# Patient Record
Sex: Female | Born: 1975 | Race: Black or African American | Hispanic: No | Marital: Married | Smoking: Never smoker
Health system: Southern US, Community
[De-identification: ages and names within clinical notes are randomized; demographics above are authoritative.]

## PROBLEM LIST (undated history)

## (undated) DIAGNOSIS — I10 Essential (primary) hypertension: Secondary | ICD-10-CM

## (undated) DIAGNOSIS — R011 Cardiac murmur, unspecified: Secondary | ICD-10-CM

## (undated) HISTORY — PX: ABDOMINOPLASTY: SUR9

## (undated) HISTORY — DX: Cardiac murmur, unspecified: R01.1

## (undated) HISTORY — PX: KNEE SURGERY: SHX244

## (undated) HISTORY — PX: APPENDECTOMY: SHX54

## (undated) HISTORY — PX: CHOLECYSTECTOMY: SHX55

---

## 2001-11-05 ENCOUNTER — Inpatient Hospital Stay (HOSPITAL_COMMUNITY): Admission: AD | Admit: 2001-11-05 | Discharge: 2001-11-05 | Payer: Self-pay | Admitting: *Deleted

## 2001-11-06 ENCOUNTER — Inpatient Hospital Stay (HOSPITAL_COMMUNITY): Admission: AD | Admit: 2001-11-06 | Discharge: 2001-11-08 | Payer: Self-pay | Admitting: *Deleted

## 2002-06-26 ENCOUNTER — Other Ambulatory Visit: Admission: RE | Admit: 2002-06-26 | Discharge: 2002-06-26 | Payer: Self-pay | Admitting: *Deleted

## 2002-07-06 ENCOUNTER — Observation Stay (HOSPITAL_COMMUNITY): Admission: AD | Admit: 2002-07-06 | Discharge: 2002-07-08 | Payer: Self-pay | Admitting: *Deleted

## 2002-07-07 ENCOUNTER — Encounter (INDEPENDENT_AMBULATORY_CARE_PROVIDER_SITE_OTHER): Payer: Self-pay

## 2002-09-09 ENCOUNTER — Encounter: Payer: Self-pay | Admitting: *Deleted

## 2002-09-09 ENCOUNTER — Ambulatory Visit (HOSPITAL_COMMUNITY): Admission: RE | Admit: 2002-09-09 | Discharge: 2002-09-09 | Payer: Self-pay | Admitting: *Deleted

## 2003-01-27 ENCOUNTER — Inpatient Hospital Stay (HOSPITAL_COMMUNITY): Admission: AD | Admit: 2003-01-27 | Discharge: 2003-01-27 | Payer: Self-pay | Admitting: *Deleted

## 2003-02-04 ENCOUNTER — Inpatient Hospital Stay (HOSPITAL_COMMUNITY): Admission: AD | Admit: 2003-02-04 | Discharge: 2003-02-07 | Payer: Self-pay | Admitting: *Deleted

## 2003-02-05 ENCOUNTER — Encounter (INDEPENDENT_AMBULATORY_CARE_PROVIDER_SITE_OTHER): Payer: Self-pay

## 2003-06-28 ENCOUNTER — Emergency Department (HOSPITAL_COMMUNITY): Admission: EM | Admit: 2003-06-28 | Discharge: 2003-06-28 | Payer: Self-pay | Admitting: Emergency Medicine

## 2003-06-29 ENCOUNTER — Encounter (INDEPENDENT_AMBULATORY_CARE_PROVIDER_SITE_OTHER): Payer: Self-pay | Admitting: Specialist

## 2003-06-29 ENCOUNTER — Observation Stay (HOSPITAL_COMMUNITY): Admission: EM | Admit: 2003-06-29 | Discharge: 2003-06-30 | Payer: Self-pay | Admitting: Emergency Medicine

## 2003-08-07 ENCOUNTER — Emergency Department (HOSPITAL_COMMUNITY): Admission: EM | Admit: 2003-08-07 | Discharge: 2003-08-07 | Payer: Self-pay

## 2003-08-15 ENCOUNTER — Encounter: Admission: RE | Admit: 2003-08-15 | Discharge: 2003-08-15 | Payer: Self-pay | Admitting: Emergency Medicine

## 2003-09-10 ENCOUNTER — Encounter: Admission: RE | Admit: 2003-09-10 | Discharge: 2003-09-10 | Payer: Self-pay | Admitting: Emergency Medicine

## 2005-11-17 ENCOUNTER — Ambulatory Visit (HOSPITAL_COMMUNITY): Admission: RE | Admit: 2005-11-17 | Discharge: 2005-11-17 | Payer: Self-pay | Admitting: Obstetrics & Gynecology

## 2006-05-06 ENCOUNTER — Emergency Department (HOSPITAL_COMMUNITY): Admission: EM | Admit: 2006-05-06 | Discharge: 2006-05-06 | Payer: Self-pay | Admitting: Family Medicine

## 2006-08-03 ENCOUNTER — Inpatient Hospital Stay (HOSPITAL_COMMUNITY): Admission: AD | Admit: 2006-08-03 | Discharge: 2006-08-03 | Payer: Self-pay | Admitting: Obstetrics & Gynecology

## 2006-08-03 ENCOUNTER — Encounter (INDEPENDENT_AMBULATORY_CARE_PROVIDER_SITE_OTHER): Payer: Self-pay | Admitting: Specialist

## 2007-03-26 ENCOUNTER — Emergency Department (HOSPITAL_COMMUNITY): Admission: EM | Admit: 2007-03-26 | Discharge: 2007-03-26 | Payer: Self-pay | Admitting: Emergency Medicine

## 2007-04-24 ENCOUNTER — Other Ambulatory Visit: Admission: RE | Admit: 2007-04-24 | Discharge: 2007-04-24 | Payer: Self-pay | Admitting: Obstetrics & Gynecology

## 2007-09-25 ENCOUNTER — Emergency Department (HOSPITAL_COMMUNITY): Admission: EM | Admit: 2007-09-25 | Discharge: 2007-09-25 | Payer: Self-pay | Admitting: Family Medicine

## 2008-09-15 ENCOUNTER — Inpatient Hospital Stay (HOSPITAL_COMMUNITY): Admission: AD | Admit: 2008-09-15 | Discharge: 2008-09-17 | Payer: Self-pay | Admitting: Obstetrics and Gynecology

## 2008-10-15 ENCOUNTER — Inpatient Hospital Stay (HOSPITAL_COMMUNITY): Admission: AD | Admit: 2008-10-15 | Discharge: 2008-10-18 | Payer: Self-pay | Admitting: Obstetrics and Gynecology

## 2009-07-26 ENCOUNTER — Emergency Department (HOSPITAL_COMMUNITY): Admission: EM | Admit: 2009-07-26 | Discharge: 2009-07-26 | Payer: Self-pay | Admitting: Emergency Medicine

## 2010-11-11 LAB — DIFFERENTIAL
Basophils Absolute: 0 10*3/uL (ref 0.0–0.1)
Basophils Relative: 0 % (ref 0–1)
Eosinophils Absolute: 0.1 10*3/uL (ref 0.0–0.7)
Eosinophils Relative: 1 % (ref 0–5)
Lymphocytes Relative: 4 % — ABNORMAL LOW (ref 12–46)
Lymphs Abs: 0.5 10*3/uL — ABNORMAL LOW (ref 0.7–4.0)
Monocytes Absolute: 0.3 10*3/uL (ref 0.1–1.0)
Monocytes Relative: 3 % (ref 3–12)
Neutro Abs: 10.6 10*3/uL — ABNORMAL HIGH (ref 1.7–7.7)
Neutrophils Relative %: 92 % — ABNORMAL HIGH (ref 43–77)

## 2010-11-11 LAB — CULTURE, BLOOD (ROUTINE X 2)
Culture: NO GROWTH
Culture: NO GROWTH

## 2010-11-11 LAB — COMPREHENSIVE METABOLIC PANEL
ALT: 24 U/L (ref 0–35)
ALT: 31 U/L (ref 0–35)
AST: 20 U/L (ref 0–37)
AST: 29 U/L (ref 0–37)
Albumin: 2.9 g/dL — ABNORMAL LOW (ref 3.5–5.2)
Albumin: 3.6 g/dL (ref 3.5–5.2)
Alkaline Phosphatase: 58 U/L (ref 39–117)
Alkaline Phosphatase: 72 U/L (ref 39–117)
BUN: 6 mg/dL (ref 6–23)
BUN: 7 mg/dL (ref 6–23)
CO2: 24 mEq/L (ref 19–32)
CO2: 25 mEq/L (ref 19–32)
Calcium: 8.4 mg/dL (ref 8.4–10.5)
Calcium: 8.8 mg/dL (ref 8.4–10.5)
Chloride: 106 mEq/L (ref 96–112)
Chloride: 108 mEq/L (ref 96–112)
Creatinine, Ser: 0.98 mg/dL (ref 0.4–1.2)
Creatinine, Ser: 1.25 mg/dL — ABNORMAL HIGH (ref 0.4–1.2)
GFR calc Af Amer: >60 mL/min (ref >60)
GFR calc Af Amer: >60 mL/min (ref >60)
GFR calc non Af Amer: 50 mL/min — ABNORMAL LOW (ref >60)
GFR calc non Af Amer: >60 mL/min (ref >60)
Glucose, Bld: 109 mg/dL — ABNORMAL HIGH (ref 70–99)
Glucose, Bld: 62 mg/dL — ABNORMAL LOW (ref 70–99)
Potassium: 3.5 mEq/L (ref 3.5–5.1)
Potassium: 3.6 mEq/L (ref 3.5–5.1)
Sodium: 136 mEq/L (ref 135–145)
Sodium: 141 mEq/L (ref 135–145)
Total Bilirubin: 0.5 mg/dL (ref 0.3–1.2)
Total Bilirubin: 0.9 mg/dL (ref 0.3–1.2)
Total Protein: 6.1 g/dL (ref 6.0–8.3)
Total Protein: 6.3 g/dL (ref 6.0–8.3)

## 2010-11-11 LAB — URINALYSIS, ROUTINE W REFLEX MICROSCOPIC
Bilirubin Urine: NEGATIVE
Glucose, UA: NEGATIVE mg/dL
Ketones, ur: NEGATIVE mg/dL
Nitrite: NEGATIVE
Protein, ur: 30 mg/dL — AB
Specific Gravity, Urine: 1.02 (ref 1.005–1.030)
Urobilinogen, UA: 0.2 mg/dL (ref 0.0–1.0)
pH: 6 (ref 5.0–8.0)

## 2010-11-11 LAB — URIC ACID: Uric Acid, Serum: 4.4 mg/dL (ref 2.4–7.0)

## 2010-11-11 LAB — CBC
HCT: 31.4 % — ABNORMAL LOW (ref 36.0–46.0)
HCT: 36 % (ref 36.0–46.0)
Hemoglobin: 10.1 g/dL — ABNORMAL LOW (ref 12.0–15.0)
Hemoglobin: 11.7 g/dL — ABNORMAL LOW (ref 12.0–15.0)
MCHC: 32.3 g/dL (ref 30.0–36.0)
MCHC: 32.5 g/dL (ref 30.0–36.0)
MCV: 80.5 fL (ref 78.0–100.0)
MCV: 80.7 fL (ref 78.0–100.0)
Platelets: 154 10*3/uL (ref 150–400)
Platelets: 195 10*3/uL (ref 150–400)
RBC: 3.89 MIL/uL (ref 3.87–5.11)
RBC: 4.47 MIL/uL (ref 3.87–5.11)
RDW: 15.1 % (ref 11.5–15.5)
RDW: 15.3 % (ref 11.5–15.5)
WBC: 11.5 10*3/uL — ABNORMAL HIGH (ref 4.0–10.5)
WBC: 6.1 10*3/uL (ref 4.0–10.5)

## 2010-11-11 LAB — URINE CULTURE: Colony Count: >=100000

## 2010-11-11 LAB — URINE MICROSCOPIC-ADD ON

## 2010-11-16 LAB — CBC
HCT: 34.1 % — ABNORMAL LOW (ref 36.0–46.0)
Hemoglobin: 10.9 g/dL — ABNORMAL LOW (ref 12.0–15.0)
MCHC: 32.1 g/dL (ref 30.0–36.0)
MCV: 82.3 fL (ref 78.0–100.0)
MCV: 81.8 fL (ref 78.0–100.0)
Platelets: 211 10*3/uL (ref 150–400)
Platelets: 192 10*3/uL (ref 150–400)
RBC: 4.14 MIL/uL (ref 3.87–5.11)
RDW: 14.8 % (ref 11.5–15.5)
RDW: 14.7 % (ref 11.5–15.5)
WBC: 10.6 10*3/uL — ABNORMAL HIGH (ref 4.0–10.5)
WBC: 14.8 10*3/uL — ABNORMAL HIGH (ref 4.0–10.5)

## 2010-11-16 LAB — RPR: RPR Ser Ql: NONREACTIVE

## 2010-12-08 ENCOUNTER — Other Ambulatory Visit: Payer: Self-pay | Admitting: Obstetrics and Gynecology

## 2010-12-14 NOTE — H&P (Signed)
[name]           ACCOUNT NO.:  [id number]   MEDICAL RECORD NO.:  [id number]          PATIENT TYPE:  INP   LOCATION:  9310                          FACILITY:  [facility]   PHYSICIAN:  Lenoard Aden, M.D.DATE OF BIRTH:  03-Nov-1975   DATE OF ADMISSION:  10/15/2008  DATE OF DISCHARGE:                              HISTORY & PHYSICAL   CHIEF COMPLAINT:  Fever, flank pain, nausea, and vomiting.   She is a 35 year old African American female G5, P3 status post  uncomplicated vaginal delivery September 15, 2008, who presents for 24-  hour history of right flank pain, fever, nausea, and vomiting.  She  denies dysuria.  She denies abdominal pain.  She denies any change in  breathing patterns.  Her home temperature reportedly 104.  The patient  called last night with these complaints and was instructed to come to  the hospital at that time.  However, due to having 3 children and  inability to travel, she elected to stay at home and presented to the  office today with these complaints.   She has no known drug allergies.   MEDICATIONS:  Prenatal vitamins.   She is a nonsmoker and nondrinker.  She denies domestic or physical  violence.   She has a history of 3 vaginal deliveries and 2 miscarriages.   SURGICAL HISTORY:  Remarkable for cholecystectomy and appendectomy.   Pregnancy course and delivery reportedly uncomplicated.   PHYSICAL EXAMINATION:  GENERAL:  She is a uncomfortable-appearing,  African American female in no acute distress.  VITAL SIGNS:  Blood pressure is normal.  Temperature of 102.  HEENT:  Normal.  NECK:  Supple.  Full range of motion.  LUNGS:  Clear to auscultation.  HEART:  Regular rhythm.  ABDOMEN:  Soft. Uterus at about 12-16 weeks' sizeand nontender.  PELVIC:  Deferred.  There is right CVA tenderness and flank tenderness  noted.  SKIN:  Warm, supple, and intact.  NEUROLOGIC:  Nonfocal.   Urinalysis is pending.  WBC reveals an elevated white blood  cell count  of 11.5.  Blood cultures and urine cultures were ordered.   IMPRESSION:  Postpartum fever, flank pain, likely consistent with  pyelonephritis, somewhat atypical presentation due to flank pain versus  pure left costovertebral angle tenderness.   PLAN:  To admit.  Administer IV antibiotics.  Rocephin 1 g IV q.24 h.  Renal ultrasound ordered.  We will monitor as an inpatient at this time.      Lenoard Aden, M.D.  Electronically Signed     RJT/MEDQ  D:  10/15/2008  T:  10/16/2008  Job:  [id number]

## 2010-12-14 NOTE — Discharge Summary (Signed)
[name], [name]           ACCOUNT NO.:  [id number]   MEDICAL RECORD NO.:  [id number]          PATIENT TYPE:  INP   LOCATION:  9310                          FACILITY:  [facility]   PHYSICIAN:  Maxie Better, M.D.DATE OF BIRTH:  03/15/1976   DATE OF ADMISSION:  10/15/2008  DATE OF DISCHARGE:  10/18/2008                               DISCHARGE SUMMARY   ADMISSION DIAGNOSIS:  Postpartum fever, presumed pyelonephritis.   DISCHARGE DIAGNOSES:  1. Escherichia coli pyelonephritis.  2. New onset hypertension.   HISTORY OF PRESENT ILLNESS:  A 35 year old, gravida 5, para 3 female  status post uncomplicated vaginal delivery on September 15, 2008, who  presented with a couple of days history of back pain with associated  fever, nausea, and vomiting.  The temperature on admission in the office  had been 104.  The patient has had room removal of her appendix and  gallbladder.  She has had intermittent headaches, postpartum had been  feeling fine up until Monday.   HOSPITAL COURSE:  The patient was admitted to [facility].  She  underwent urinalysis, blood cultures, urine culture, and CBC.  The  patient was started on Rocephin 1 g IV q.24 h. and renal ultrasound was  also ordered.  The urine culture showed E. coli sensitive to  ceftriaxone.  Blood cultures with no growth so far.  Her renal  ultrasound had showed no evidence of hydronephrosis or masses.  She  underwent an abdominopelvic CT scan due to temperature spiked to 103 on  October 16, 2008.  The CT was notable for heterogenous cortical  enhancement in the lower pole of the right kidney concerning for focal  pyelonephritis and enhancement of ureteral wall, they represent  pyoureter.  Her CT scan of the pelvis had a postpartum uterus and no  acute abdominal processes.  The patient was continued on IV Rocephin  until she was afebrile for a little bit over 24 hours.  Her back pain  resolved.  She would have intermittent headaches for  which she was given  Motrin and ultimately Darvocet.  The patient had had headaches, which  was a presumed migraines treated with Midrin during her pregnancy.  On  hospital day #4, the patient had a blood pressure elevation at 150/110.  She now has a blood pressure of 150/91.  The patient has a family  history of blood pressure problems, but denied any problems with blood  pressure with the previous two pregnancies.  PIH labs were obtained  which were completely normal.  Extremity had no edema.  She had no other  complaints.  The patient desired to go home.  Plan would be to discharge  her to home on blood pressure medications and followup.  Her CBC on  admission, she had a white count of 11.5, hemoglobin of 11.7, hematocrit  36, and platelet count 154,000.  She had a left shift.  Her current  white count on October 17, 2008, was 6.1, hemoglobin of 10.1, hematocrit  31.4, and platelet count 195,000.  Her liver studies were normal.   DISPOSITION:  Home.   CONDITION:  Stable.  DISCHARGE MEDICATIONS:  1. Keflex 500 mg 1 p.o. q.6 h. for 11 days to complete a 14-day      course.  2. Ambien 10 mg 1 p.o. nightly p.r.n.  3. Darvocet-N 100, #30 one to two tablets every 4-6 hours p.r.n.      headache or back pain.  4. Hydrochlorothiazide 25 mg 1 p.o. daily.   Followup appointment on Tuesday at the office for blood pressure check  and October 29, 2008, appropriate regular postpartum appointment.   DISCHARGE INSTRUCTIONS:  Call if temperature greater than 100.4.  Return  if her back pain and headache not responsive to her pain medicine.  She  will call the office for headache referral on Monday and rest of her  routine postpartum instructions including preeclampsia check.      Maxie Better, M.D.  Electronically Signed     [place]/MEDQ  D:  10/18/2008  T:  10/18/2008  Job:  [id number]

## 2010-12-17 NOTE — Op Note (Signed)
NAME:  [name], [name]                     ACCOUNT NO.:  [id number]   MEDICAL RECORD NO.:  [id number]                   PATIENT TYPE:  INP   LOCATION:  0455                                 FACILITY:  [facility]   PHYSICIAN:  Lorre Munroe., M.D.            DATE OF BIRTH:  1976/04/13   DATE OF PROCEDURE:  06/29/2003  DATE OF DISCHARGE:                                 OPERATIVE REPORT   PREOPERATIVE DIAGNOSIS:  Symptomatic cholelithiasis and choledocholithiasis.   POSTOPERATIVE DIAGNOSIS:  Symptomatic cholelithiasis and  choledocholithiasis.   OPERATION/PROCEDURE:  Laparoscopic cholecystectomy with operative  cholangiogram and irrigation of the common bile duct.   SURGEON:  Lebron Conners, M.D.   ASSISTANT:  Angelia Mould. Derrell Lolling, M.D.   ANESTHESIA:  General.   DESCRIPTION OF PROCEDURE:  After the patient was monitored and anesthetized  and had routine preparation and draping of the abdomen, I injected local  anesthetic in four sites; one just below the umbilicus, two in the right mid  abdomen and one in the epigastrium.  I made a short vertical incision  utilizing the previous appendectomy incision just below the umbilicus and  dissected down through the scar and fat, incised the fascia longitudinally  and opened the peritoneum bluntly.  I put in my finger and swept it around  to be sure there were no adhesions of viscera in that region and found it to  be free.  I then secured a Hasson cannula with a 0 Vicryl pursestring suture  in the fascia and inflated the abdomen with CO2.  Laparoscopy disclosed no  abnormalities except for distended gallbladder with some edema of it.  I put  in three additional ports and then with the patient positioned head-up, foot-  down and tilted to the left, I retracted the fundus of the gallbladder  toward the right shoulder and infundibulum of the gallbladder to the right.  I dissected the hepatoduodenal ligament to demonstrate the cystic duct and  clipped it as it emerged from the infundibulum of the gallbladder.  I then  put in a Cook cholangiogram catheter, secured with a single clip and  performed a fluoroscopic cholangiogram which demonstrated normal size ducts  but obstruction of the distal common bile duct with filling defects.  It  appeared to be a small gallstone.  The anesthetist gave the patient an  ampule of glucagon and after allowing that to circulate, irrigated the  common duct with 100 mL of normal saline solution.  I then performed another  cholangiogram and it showed that the filling defect had cleared and that the  duct had normal anatomy with free flow into the duodenum.  No other filling  defects were noted.  I then put the laparoscope back in after getting  pneumoperitoneum back, removed the clip and cholangiogram catheter and  clipped the distal cystic duct with three additional clips.  I then divided  the cystic duct  I dissected out the cystic  artery and clipped and divided  it, then dissected the gallbladder from the liver using the cautery.  I got  hemostasis with the cautery and was satisfactory with hemostasis and with  security of the clips.  After detaching the gallbladder from the liver, I  removed it through the umbilical incision and tied the pursestring suture.  I briefly irrigated the right upper  quadrant and removed the irrigant.  I removed the lateral ports under direct  vision, then allowed the CO2 to escape and removed the epigastric port.  I  closed all skin incisions with intracuticular 4-0 Vicryl and Steri-Strips.  Sponge, needle and instrument counts were correct.  The patient was stable  throughout the procedure.                                               Lorre Munroe., M.D.    WB/MEDQ  D:  06/29/2003  T:  06/29/2003  Job:  [id number]

## 2010-12-17 NOTE — Op Note (Signed)
[name], [name]                     ACCOUNT NO.:  [id number]   MEDICAL RECORD NO.:  [id number]                   PATIENT TYPE:  INP   LOCATION:  0101                                 FACILITY:  [facility]   PHYSICIAN:  Currie Paris, M.D.           DATE OF BIRTH:  05/06/1976   DATE OF PROCEDURE:  07/06/2002  DATE OF DISCHARGE:                                 OPERATIVE REPORT   PREOPERATIVE DIAGNOSES:  1. Acute appendicitis.  2. Intrauterine pregnancy at 10 weeks.   POSTOPERATIVE DIAGNOSES:  1. Acute appendicitis.  2. Intrauterine pregnancy at 10 weeks.  3. Intra-abdominal adhesions.   SURGEON:  Currie Paris, M.D.   ANESTHESIA:  General.   CLINICAL HISTORY:  This patient is a 35 year old, [redacted] weeks pregnant, with  signs and symptoms of acute appendicitis, abdominal pain, increased white  count, and low-grade fever over a 12 hour observation.  After discussion of  the alternatives, we went ahead and elected to take her to the operating  room.   DESCRIPTION OF PROCEDURE:  The patient was seen in the holding area and had  no further questions.  She was taken to the operating room, and after  satisfactory general endotracheal anesthesia, the abdomen was prepped and  draped.  A Foley catheter was placed.  Local was infiltrated to the  umbilical area, and an umbilical incision was made.  The fascia was opened  and a 10 mm trocar placed.  With the camera in place and the abdomen  insufflated to 15, I placed a 5 mm trocar in the right upper quadrant and a  10-11 in the left lower quadrant.  The appendix was noted to be acutely  inflamed with a little purulent fluid around it but not perforated.  There  was an adhesion that went up towards the bladder, coming from apparently  omentum quite long and _______ for some subsequent obstruction.  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   After we had gotten exposure and had seen the anatomy, I went ahead and  divided  the adhesion at both ends using the harmonic scalpel and removed  that.  I was able to grasp the appendix, free it up a little bit, and then  divide the mesoappendix down to the base.  Once we had gotten down to that  it, I put the endo GIA across and fired it and produced good division of the  appendix right at its base.  The appendix was placed in an appendiceal bag  and then pulled out.  I could see that that had removed.  We did an  irrigation and a final check for hemostasis, and everything appeared to be  okay.  The right upper trocar was removed.  The left lower quadrant trocar  was removed.  There was no bleeding from either site.  The abdomen was  deflated through the umbilical port, and that was closed down  with a  pursestring.  The skin was closed with 4-0 Monocryl subcuticular and Steri-  Strips.  The patient tolerated the procedure well.  There were no operative  complications.  All counts were correct.                                               Currie Paris, M.D.   CJS/MEDQ  D:  07/07/2002  T:  07/07/2002  Job:  [id number]

## 2010-12-17 NOTE — Consult Note (Signed)
NAME:  [name], [name]                     ACCOUNT NO.:  [id number]   MEDICAL RECORD NO.:  [id number]                   PATIENT TYPE:  INP   LOCATION:  0451                                 [facility]ACILITY:  [facility]   PHYSICIAN:  Currie Paris, M.D.           DATE OF BIRTH:  02-11-1976   DATE OF CONSULTATION:  DATE OF DISCHARGE:                                   CONSULTATION   REASON FOR CONSULTATION:  Right lower quadrant pain.   CLINICAL HISTORY:  I was asked by Dr. Galen Daft to see Ms. Berkey for right  lower quadrant abdominal pain.   HISTORY OF PRESENT ILLNESS:  The patient is a 35 year old woman who is [redacted]  weeks pregnant, who woke up approximately 24 hours ago with some mid  abdominal pain. She presented to the emergency room last night at [facility] with localization to the pain at the right lower quadrant plus some  nausea and anorexia but no vomiting or diarrhea. She had normal bowel  movement prior to coming to the hospital.  Dr. Galen Daft noted at the time of  admission a normal white count. No fever. Overnight, she has had persistent  pain with it being more localized into the right lower quadrant and remained  with some mild nausea and anorexia. She has not had fever or chills but  attempted to go up to 101 during the night.   PAST MEDICAL HISTORY:  None.   PAST SURGICAL HISTORY:  None.   MEDICATIONS:  Prenatal vitamins.   ALLERGIES:  No known drug allergies.   SOCIAL HISTORY:  No tobacco or alcohol.   FAMILY HISTORY:  The patient has multiple members who have diabetes  mellitus. No family history of cancer, heart disease, etc.   REVIEW OF SYSTEMS:  No headaches. Eyes, no problems. Chest with no cough,  shortness of breath. Heart with no murmur, rub, or gallop. No hypertension.  Abdomen with no history of GI complaints. Never had any similar episodes to  the current ones. GU, has one prior pregnancy and delivery uncomplicated. No  prior abdominal operations.  Extremities negative.   PHYSICAL EXAMINATION:  GENERAL: Healthy but slightly uncomfortable appearing  35 year old.  HEENT: Normocephalic. Eyes nonicteric. Pupils are equal, round, and reactive  to light and accommodation.  NECK: Supple. No masses or thyromegaly.  LUNGS: Clear to auscultation and percussion. Respirations normal.  HEART: Regular rate and rhythm. No murmur, rub, or gallop. She has good  pulses in the carotid, femoral, and dorsalis pedis.  ABDOMEN: Generally soft but she has guarding in the right lower quadrant  with marked right lower quadrant tenderness. She has referred rebound to the  right lower quadrant. Bowel sounds are present.  PELVIC: Not done.  RECTAL: Not done. Please see Dr. Zenia Resides note with apparent negative  examination.  EXTREMITIES: No clubbing, cyanosis, or edema.   DIAGNOSTIC IMPRESSION:  She initially had an ultrasound with a viable  intrauterine pregnancy  with no adnexal masses or cysts.   LABORATORY DATA:  White count 9,500. Hemoglobin 12.6 on admission. Repeat  this morning revealed white count up to 12,500. She has a left shift. UA was  basically negative.   IMPRESSION:  1. Acute appendicitis  2. Intrauterine pregnancy.   RECOMMENDATIONS:  I discussed the situation with Dr. Galen Daft and I think that  we ought to go ahead with tentative laparoscopic appendectomy. We may need  to do an open but reviewed that with the patient and her husband. I have  talked about the risks of appendicitis and the fact that this could be  normal and the risks of the loss of the baby of about 5% just from a general  anesthetic and/or from appendicitis. I think all questions have been  answered and they are willing to proceed with surgery.                                                Currie Paris, M.D.    CJS/MEDQ  D:  07/07/2002  T:  07/07/2002  Job:  [id number]   cc:   Ronda Fairly. Galen Daft, M.D.  [address], [address]  [place]  [place] [address]  Fax:  [phone]

## 2010-12-17 NOTE — H&P (Signed)
NAME:  [name], [name]                     ACCOUNT NO.:  [id number]   MEDICAL RECORD NO.:  [id number]                   PATIENT TYPE:  INP   LOCATION:  0102                                 [facility]ACILITY:  [facility]   PHYSICIAN:  Lorre Munroe., M.D.            DATE OF BIRTH:  28-Oct-1975   DATE OF ADMISSION:  06/29/2003  DATE OF DISCHARGE:                                HISTORY & PHYSICAL   CHIEF COMPLAINT:  Abdominal and back pain.   PRESENT ILLNESS:  The patient is a healthy 35 year old black female who has  had several recent episodes of rather severe epigastric and upper back pain.  She has not had jaundice, dark urine, or light stools. Gallbladder  ultrasound demonstrates gallstones. The pain is felt to be consistent with  biliary colic. She is admitted for laparoscopic cholecystectomy. Her liver  tests are normal and CBC is normal.   PAST MEDICAL HISTORY:  She has had a laparoscopic appendectomy a year and a  half ago; otherwise, she has had no operations. She has had vaginal delivery  childbirth. She denies all serious or chronic ailments. She does not smoke  or drink.   FAMILY HISTORY:  Childhood illnesses are unremarkable.   MEDICATIONS:  She takes no medications.   REVIEW OF SYSTEMS:  A 15-point review of systems  is totally unremarkable.   PHYSICAL EXAMINATION:  VITAL SIGNS: Temperature and vital signs are  unremarkable as reported by nursing staff.  GENERAL: Her mental status is normal, although she says she is in pain.  HEENT/NECK: Unremarkable.  CHEST: Clear to auscultation.  BREASTS: Normal.  HEART: Rate and rhythm normal. No murmur or gallop.  ABDOMEN: Tender in the right upper quadrant; otherwise no masses,  tenderness, or organomegaly. Bowel sounds are decreased. The abdomen is  obese.  RECTAL/PELVIC: Not done.  EXTREMITIES: Normal.  LYMPH NODES: Not enlarged.  SKIN: No lesions are noted.  NEUROLOGIC: Normal.   IMPRESSION:  1. Sympathetic gallstones and  possible common bile duct stones.  2. Obesity.   PLAN:  Laparoscopic cholecystectomy with operative cholangiogram. The  patient accepts the risks of injury to the bowel, bile duct, and liver, and  will proceed with surgery today.                                               Lorre Munroe., M.D.    WB/MEDQ  D:  06/29/2003  T:  06/29/2003  Job:  [id number]

## 2012-01-30 ENCOUNTER — Ambulatory Visit: Payer: Self-pay | Admitting: *Deleted

## 2012-06-08 ENCOUNTER — Encounter (HOSPITAL_COMMUNITY): Payer: Self-pay | Admitting: Anesthesiology

## 2012-06-08 ENCOUNTER — Ambulatory Visit (HOSPITAL_COMMUNITY): Payer: BC Managed Care – PPO | Admitting: Anesthesiology

## 2012-06-08 ENCOUNTER — Ambulatory Visit (HOSPITAL_COMMUNITY)
Admission: RE | Admit: 2012-06-08 | Discharge: 2012-06-08 | Disposition: A | Discharge status: Home / Self Care | Payer: BC Managed Care – PPO | Source: Ambulatory Visit | Attending: Orthopedic Surgery | Admitting: Orthopedic Surgery

## 2012-06-08 ENCOUNTER — Encounter (HOSPITAL_COMMUNITY): Payer: Self-pay | Admitting: *Deleted

## 2012-06-08 ENCOUNTER — Other Ambulatory Visit (HOSPITAL_COMMUNITY): Payer: Self-pay | Admitting: Orthopedic Surgery

## 2012-06-08 ENCOUNTER — Encounter (HOSPITAL_COMMUNITY)
Admission: RE | Disposition: A | Discharge status: Home / Self Care | Payer: Self-pay | Source: Ambulatory Visit | Attending: Orthopedic Surgery

## 2012-06-08 DIAGNOSIS — X500XXA Overexertion from strenuous movement or load, initial encounter: Secondary | ICD-10-CM | POA: Insufficient documentation

## 2012-06-08 DIAGNOSIS — Y9367 Activity, basketball: Secondary | ICD-10-CM | POA: Insufficient documentation

## 2012-06-08 DIAGNOSIS — S86011A Strain of right Achilles tendon, initial encounter: Secondary | ICD-10-CM

## 2012-06-08 DIAGNOSIS — Z01812 Encounter for preprocedural laboratory examination: Secondary | ICD-10-CM | POA: Insufficient documentation

## 2012-06-08 DIAGNOSIS — S93499A Sprain of other ligament of unspecified ankle, initial encounter: Secondary | ICD-10-CM | POA: Insufficient documentation

## 2012-06-08 HISTORY — PX: ACHILLES TENDON SURGERY: SHX542

## 2012-06-08 LAB — COMPREHENSIVE METABOLIC PANEL
ALT: 31 U/L (ref 0–35)
AST: 30 U/L (ref 0–37)
Albumin: 3.5 g/dL (ref 3.5–5.2)
Alkaline Phosphatase: 44 U/L (ref 39–117)
Glucose, Bld: 87 mg/dL (ref 70–99)
Potassium: 4.2 mEq/L (ref 3.5–5.1)
Sodium: 139 mEq/L (ref 135–145)
Total Protein: 6.5 g/dL (ref 6.0–8.3)

## 2012-06-08 LAB — CBC
Hemoglobin: 12.2 g/dL (ref 12.0–15.0)
MCHC: 32.7 g/dL (ref 30.0–36.0)
Platelets: 213 10*3/uL (ref 150–400)
RDW: 12.9 % (ref 11.5–15.5)

## 2012-06-08 LAB — APTT: aPTT: 32 seconds (ref 24–37)

## 2012-06-08 SURGERY — REPAIR, TENDON, ACHILLES
Anesthesia: General | Site: Foot | Laterality: Right | Wound class: Clean

## 2012-06-08 MED ORDER — ARTIFICIAL TEARS OP OINT
TOPICAL_OINTMENT | OPHTHALMIC | Status: DC | PRN
  Administered 2012-06-08: 1 via OPHTHALMIC

## 2012-06-08 MED ORDER — ACETAMINOPHEN 10 MG/ML IV SOLN
INTRAVENOUS | Status: AC
  Administered 2012-06-08: 1000 mg via INTRAVENOUS
  Filled 2012-06-08: qty 100

## 2012-06-08 MED ORDER — ACETAMINOPHEN 10 MG/ML IV SOLN
1000.0000 mg | Freq: Once | INTRAVENOUS | Status: AC | PRN
  Administered 2012-06-08: 1000 mg via INTRAVENOUS

## 2012-06-08 MED ORDER — LIDOCAINE HCL (CARDIAC) 20 MG/ML IV SOLN
INTRAVENOUS | Status: DC | PRN
  Administered 2012-06-08: 50 mg via INTRAVENOUS

## 2012-06-08 MED ORDER — FENTANYL CITRATE 0.05 MG/ML IJ SOLN
INTRAMUSCULAR | Status: DC | PRN
  Administered 2012-06-08: 25 ug via INTRAVENOUS
  Administered 2012-06-08: 50 ug via INTRAVENOUS

## 2012-06-08 MED ORDER — HYDROCODONE-ACETAMINOPHEN 5-500 MG PO TABS: 1.0000 | ORAL_TABLET | Freq: Four times a day (QID) | ORAL | Status: DC | PRN

## 2012-06-08 MED ORDER — 0.9 % SODIUM CHLORIDE (POUR BTL) OPTIME
TOPICAL | Status: DC | PRN
  Administered 2012-06-08: 1000 mL

## 2012-06-08 MED ORDER — ONDANSETRON HCL 4 MG/2ML IJ SOLN
INTRAMUSCULAR | Status: AC
  Filled 2012-06-08: qty 2

## 2012-06-08 MED ORDER — HYDROMORPHONE HCL PF 1 MG/ML IJ SOLN
INTRAMUSCULAR | Status: AC
  Administered 2012-06-08: 0.5 mg via INTRAVENOUS
  Filled 2012-06-08: qty 1

## 2012-06-08 MED ORDER — PROPOFOL 10 MG/ML IV BOLUS
INTRAVENOUS | Status: DC | PRN
  Administered 2012-06-08: 200 mg via INTRAVENOUS

## 2012-06-08 MED ORDER — MIDAZOLAM HCL 2 MG/2ML IJ SOLN
1.0000 mg | INTRAMUSCULAR | Status: DC | PRN
  Administered 2012-06-08: 2 mg via INTRAVENOUS

## 2012-06-08 MED ORDER — MUPIROCIN 2 % EX OINT
TOPICAL_OINTMENT | Freq: Once | CUTANEOUS | Status: DC
  Filled 2012-06-08: qty 22

## 2012-06-08 MED ORDER — FENTANYL CITRATE 0.05 MG/ML IJ SOLN
50.0000 ug | INTRAMUSCULAR | Status: DC | PRN
  Administered 2012-06-08: 100 ug via INTRAVENOUS

## 2012-06-08 MED ORDER — FENTANYL CITRATE 0.05 MG/ML IJ SOLN
INTRAMUSCULAR | Status: AC
  Filled 2012-06-08: qty 2

## 2012-06-08 MED ORDER — ONDANSETRON HCL 4 MG/2ML IJ SOLN: 4.0000 mg | Freq: Once | INTRAMUSCULAR | Status: DC | PRN

## 2012-06-08 MED ORDER — LACTATED RINGERS IV SOLN
INTRAVENOUS | Status: DC | PRN
  Administered 2012-06-08: 16:00:00 via INTRAVENOUS

## 2012-06-08 MED ORDER — CEFAZOLIN SODIUM-DEXTROSE 2-3 GM-% IV SOLR
2.0000 g | INTRAVENOUS | Status: AC
  Administered 2012-06-08: 2 g via INTRAVENOUS
  Filled 2012-06-08: qty 50

## 2012-06-08 MED ORDER — HYDROMORPHONE HCL PF 1 MG/ML IJ SOLN
INTRAMUSCULAR | Status: AC
  Filled 2012-06-08: qty 1

## 2012-06-08 MED ORDER — MIDAZOLAM HCL 2 MG/2ML IJ SOLN
INTRAMUSCULAR | Status: AC
  Filled 2012-06-08: qty 2

## 2012-06-08 MED ORDER — MIDAZOLAM HCL 5 MG/5ML IJ SOLN
INTRAMUSCULAR | Status: DC | PRN
  Administered 2012-06-08: 1 mg via INTRAVENOUS

## 2012-06-08 MED ORDER — ONDANSETRON HCL 4 MG/2ML IJ SOLN
INTRAMUSCULAR | Status: DC | PRN
  Administered 2012-06-08 (×2): 4 mg via INTRAVENOUS

## 2012-06-08 MED ORDER — HYDROMORPHONE HCL PF 1 MG/ML IJ SOLN
0.2500 mg | INTRAMUSCULAR | Status: DC | PRN
  Administered 2012-06-08 (×2): 0.5 mg via INTRAVENOUS

## 2012-06-08 MED ORDER — LACTATED RINGERS IV SOLN
INTRAVENOUS | Status: DC
  Administered 2012-06-08: 15:00:00 via INTRAVENOUS

## 2012-06-08 SURGICAL SUPPLY — 40 items
BANDAGE GAUZE ELAST BULKY 4 IN (GAUZE/BANDAGES/DRESSINGS) ×2 IMPLANT
BLADE SURG 10 STRL SS (BLADE) ×2 IMPLANT
BNDG COHESIVE 6X5 TAN STRL LF (GAUZE/BANDAGES/DRESSINGS) ×2 IMPLANT
BNDG ESMARK 4X9 LF (GAUZE/BANDAGES/DRESSINGS) IMPLANT
BNDG GAUZE STRTCH 6 (GAUZE/BANDAGES/DRESSINGS) ×2 IMPLANT
CLOTH BEACON ORANGE TIMEOUT ST (SAFETY) ×2 IMPLANT
COTTON STERILE ROLL (GAUZE/BANDAGES/DRESSINGS) ×2 IMPLANT
CUFF TOURNIQUET SINGLE 34IN LL (TOURNIQUET CUFF) IMPLANT
CUFF TOURNIQUET SINGLE 44IN (TOURNIQUET CUFF) IMPLANT
DRAPE INCISE IOBAN 66X45 STRL (DRAPES) ×2 IMPLANT
DRAPE U-SHAPE 47X51 STRL (DRAPES) ×2 IMPLANT
DRSG ADAPTIC 3X8 NADH LF (GAUZE/BANDAGES/DRESSINGS) ×2 IMPLANT
DRSG PAD ABDOMINAL 8X10 ST (GAUZE/BANDAGES/DRESSINGS) ×2 IMPLANT
DURAPREP 26ML APPLICATOR (WOUND CARE) ×2 IMPLANT
ELECT REM PT RETURN 9FT ADLT (ELECTROSURGICAL) ×2
ELECTRODE REM PT RTRN 9FT ADLT (ELECTROSURGICAL) ×1 IMPLANT
GLOVE BIOGEL PI IND STRL 9 (GLOVE) ×1 IMPLANT
GLOVE BIOGEL PI INDICATOR 9 (GLOVE) ×1
GLOVE SURG ORTHO 9.0 STRL STRW (GLOVE) ×2 IMPLANT
GOWN PREVENTION PLUS XLARGE (GOWN DISPOSABLE) ×2 IMPLANT
GOWN SRG XL XLNG 56XLVL 4 (GOWN DISPOSABLE) ×1 IMPLANT
GOWN STRL NON-REIN XL XLG LVL4 (GOWN DISPOSABLE) ×1
KIT ROOM TURNOVER OR (KITS) ×2 IMPLANT
MANIFOLD NEPTUNE II (INSTRUMENTS) ×2 IMPLANT
NDL SUT .5 MAYO 1.404X.05X (NEEDLE) ×1 IMPLANT
NEEDLE MAYO TAPER (NEEDLE) ×1
NS IRRIG 1000ML POUR BTL (IV SOLUTION) ×2 IMPLANT
PACK ORTHO EXTREMITY (CUSTOM PROCEDURE TRAY) ×2 IMPLANT
PAD ARMBOARD 7.5X6 YLW CONV (MISCELLANEOUS) ×4 IMPLANT
SPONGE GAUZE 4X4 12PLY (GAUZE/BANDAGES/DRESSINGS) ×2 IMPLANT
SPONGE LAP 4X18 X RAY DECT (DISPOSABLE) IMPLANT
SUT ETHILON 3 0 FSLX (SUTURE) IMPLANT
SUT FIBERWIRE #2 38 T-5 BLUE (SUTURE) ×6
SUT MNCRL AB 3-0 PS2 18 (SUTURE) IMPLANT
SUTURE FIBERWR #2 38 T-5 BLUE (SUTURE) ×3 IMPLANT
TOWEL OR 17X24 6PK STRL BLUE (TOWEL DISPOSABLE) ×2 IMPLANT
TOWEL OR 17X26 10 PK STRL BLUE (TOWEL DISPOSABLE) ×2 IMPLANT
TUBE CONNECTING 12X1/4 (SUCTIONS) ×2 IMPLANT
WATER STERILE IRR 1000ML POUR (IV SOLUTION) ×2 IMPLANT
YANKAUER SUCT BULB TIP NO VENT (SUCTIONS) ×2 IMPLANT

## 2012-06-08 NOTE — Transfer of Care (Signed)
Immediate Anesthesia Transfer of Care Note  Patient: [name]  Procedure(s) Performed: Procedure(s) (LRB) with comments: ACHILLES TENDON REPAIR (Right) - Right Achilles Reconstruction  Patient Location: PACU  Anesthesia Type:General  Level of Consciousness: awake  Airway & Oxygen Therapy: Patient Spontanous Breathing and Patient connected to nasal cannula oxygen  Post-op Assessment: Report given to PACU RN and Post -op Vital signs reviewed and stable  Post vital signs: Reviewed and stable  Complications: No apparent anesthesia complications

## 2012-06-08 NOTE — Op Note (Signed)
OPERATIVE REPORT  DATE OF SURGERY: 06/08/2012  PATIENT:  [name],  36 y.o. female  PRE-OPERATIVE DIAGNOSIS:  Right Achilles Rupture  POST-OPERATIVE DIAGNOSIS:  Right Achilles Rupture  PROCEDURE:  Procedure(s): ACHILLES TENDON REPAIR  SURGEON:  Surgeon(s): Nadara Mustard, MD  ANESTHESIA:   regional and general  EBL:  min ML  SPECIMEN:  No Specimen  TOURNIQUET:  * No tourniquets in log *  PROCEDURE DETAILS: Patient is a 36 year old woman who was playing basketball and had acute rupture of her right Achilles. Patient wishes to proceed with surgical intervention. Risks and benefits were discussed including rerupture rate difficulty with healing risk of blood clots. Patient states she understands and wished to proceed at this time. Description of procedure patient brought to the operating room after undergoing a popliteal block she then underwent a general anesthetic. After adequate levels of anesthesia were obtained patient's right lower extremity was prepped using DuraPrep draped into a sterile field. A posterior medial incision was made this was carried down to the peritenon which was incised. Patient had a mid substance tear of the Achilles. Using #2 FiberWire 2 of the #2 FiberWire were woven using a Krakw technique through the distal stump 2 additional #2 FiberWire was then woven using a Krakauer technique through the proximal stump. With the foot plantarflexed these were then sutures at the mid substance of the tear. Patient had good continuity after the repair.  The wound was irrigated the subcutaneous is closed using 2-0 Vicryl the peritenon and subcutaneous were closed the skin was closed using 2-0 nylon with an Algower Donati suture technique. The wound was covered with Adaptic orthopedic sponges AB dressing Kerlix and Coban. Patient was extubated taken to the PACU in stable condition.  PLAN OF CARE: Discharge to home after PACU  PATIENT DISPOSITION:  PACU -  hemodynamically stable.   Nadara Mustard, MD 06/08/2012 4:48 PM

## 2012-06-08 NOTE — Anesthesia Postprocedure Evaluation (Signed)
  Anesthesia Post-op Note  Patient: [name]  Procedure(s) Performed: Procedure(s) (LRB) with comments: ACHILLES TENDON REPAIR (Right) - Right Achilles Reconstruction  Patient Location: PACU  Anesthesia Type:General and GA combined with regional for post-op pain  Level of Consciousness: awake, alert  and oriented  Airway and Oxygen Therapy: Patient Spontanous Breathing  Post-op Pain: none  Post-op Assessment: Post-op Vital signs reviewed and Patient's Cardiovascular Status Stable  Post-op Vital Signs: stable  Complications: No apparent anesthesia complications

## 2012-06-08 NOTE — Anesthesia Preprocedure Evaluation (Signed)
Anesthesia Evaluation  Patient identified by MRN, date of birth, ID band Patient awake    Reviewed: Allergy & Precautions, H&P , NPO status   Airway Mallampati: II      Dental  (+) Teeth Intact and Dental Advisory Given   Pulmonary          Cardiovascular Rhythm:Regular Rate:Normal     Neuro/Psych    GI/Hepatic   Endo/Other    Renal/GU      Musculoskeletal   Abdominal   Peds  Hematology   Anesthesia Other Findings   Reproductive/Obstetrics                           Anesthesia Physical Anesthesia Plan  ASA: I  Anesthesia Plan: General   Post-op Pain Management:    Induction: Intravenous  Airway Management Planned: Oral ETT  Additional Equipment:   Intra-op Plan:   Post-operative Plan: Extubation in OR  Informed Consent: I have reviewed the patients History and Physical, chart, labs and discussed the procedure including the risks, benefits and alternatives for the proposed anesthesia with the patient or authorized representative who has indicated his/her understanding and acceptance.   Dental advisory given  Plan Discussed with: CRNA and Surgeon  Anesthesia Plan Comments: (R. Achilles tendon rupture  Plan GA with oral ETT and popliteal block  Kipp Brood, MD)        Anesthesia Quick Evaluation

## 2012-06-08 NOTE — H&P (Signed)
[name] is an 36 y.o. female.   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  History reviewed. No pertinent past medical history.  Past Surgical History  Procedure Date  . Appendectomy   . Cholecystectomy   . Knee surgery     right knee arthroscropic    History reviewed. No pertinent family history. Social History:  reports that she has never smoked. She does not have any smokeless tobacco history on file. She reports that she does not drink alcohol or use illicit drugs.  Allergies: No Known Allergies  Medications Prior to Admission  Medication Sig Dispense Refill  . ibuprofen (ADVIL,MOTRIN) 200 MG tablet Take 200 mg by mouth every 6 (six) hours as needed.      . neomycin-polymyxin-hydrocortisone (CORTISPORIN) 3.5-10000-1 otic suspension Place 3 drops in ear(s) 3 (three) times daily.        Results for orders placed during the hospital encounter of 06/08/12 (from the past 48 hour(s))  APTT     Status: Normal   Collection Time   06/08/12  1:19 PM      Component Value Range Comment   aPTT 32  24 - 37 seconds   CBC     Status: Normal   Collection Time   06/08/12  1:19 PM      Component Value Range Comment   WBC 5.6  4.0 - 10.5 K/uL    RBC 4.48  3.87 - 5.11 MIL/uL    Hemoglobin 12.2  12.0 - 15.0 g/dL    HCT 81.1  91.4 - 78.2 %    MCV 83.3  78.0 - 100.0 fL    MCH 27.2  26.0 - 34.0 pg    MCHC 32.7  30.0 - 36.0 g/dL    RDW 95.6  21.3 - 08.6 %    Platelets 213  150 - 400 K/uL   COMPREHENSIVE METABOLIC PANEL     Status: Abnormal   Collection Time   06/08/12  1:19 PM      Component Value Range Comment   Sodium 139  135 - 145 mEq/L    Potassium 4.2  3.5 - 5.1 mEq/L    Chloride 107  96 - 112 mEq/L    CO2 26  19 - 32 mEq/L    Glucose, Bld 87  70 - 99 mg/dL    BUN 11  6 - 23 mg/dL    Creatinine, Ser 5.78  0.50 - 1.10 mg/dL    Calcium 8.8  8.4 - 46.9 mg/dL    Total Protein 6.5  6.0 - 8.3 g/dL    Albumin 3.5  3.5 - 5.2 g/dL    AST 30  0 - 37 U/L    ALT 31  0 - 35 U/L    Alkaline Phosphatase 44  39 - 117 U/L    Total Bilirubin 0.5  0.3 - 1.2 mg/dL    GFR calc non Af Amer 85 (*) >90 mL/min    GFR calc Af Amer >90  >90 mL/min   PROTIME-INR     Status: Normal   Collection Time   06/08/12  1:19 PM      Component Value Range Comment   Prothrombin Time 13.0  11.6 - 15.2 seconds    INR 0.99  0.00 - 1.49   HCG, SERUM, QUALITATIVE     Status: Normal   Collection Time  06/08/12  1:19 PM      Component Value Range Comment   Preg, Serum NEGATIVE  NEGATIVE    No results found.  Review of Systems  All other systems reviewed and are negative.    Blood pressure 116/76, pulse 64, temperature 98.2 F (36.8 C), temperature source Oral, resp. rate 18, height 5\' 10"  (1.778 m), weight 88.451 kg (195 lb), last menstrual period 05/13/2012, SpO2 100.00%. Physical Exam  On examination patient has no plantarflexion of her foot with compression of the calf. There is a palpable defect of the Achilles. She has a good dorsalis pedis pulse. Assessment/Plan Assessment: Acute right Achilles tendon rupture.  Plan: Discussed operative versus nonoperative intervention discussed risk and benefits including infection rerupture rate nonhealing of the incision potential for DVT. Patient states she understands was to proceed with surgery at this time plan for repair of right Achilles tendon.  DUDA,MARCUS V 06/08/2012, 3:44 PM

## 2012-06-08 NOTE — Anesthesia Procedure Notes (Addendum)
Anesthesia Regional Block:  Popliteal block  Pre-Anesthetic Checklist: ,, timeout performed, Correct Patient, Correct Site, Correct Laterality, Correct Procedure, Correct Position, site marked, Risks and benefits discussed,  Surgical consent,  Pre-op evaluation,  At surgeon's request and post-op pain management  Laterality: Right  Prep: chloraprep       Needles:  Injection technique: Single-shot  Needle Type: Echogenic Stimulator Needle          Additional Needles:  Procedures: ultrasound guided (picture in chart) Popliteal block Narrative:  Start time: 06/08/2012 3:10 PM End time: 06/08/2012 3:20 PM Injection made incrementally with aspirations every 25 mL.  Performed by: Personally   Additional Notes: 0.5% bupivicaine with 1:200 Epi   Anesthesia Regional Block:    Pre-Anesthetic Checklist: ,, timeout performed, Correct Patient, Correct Site, Correct Laterality, Correct Procedure, Correct Position, site marked, Risks and benefits discussed,  Surgical consent,  Pre-op evaluation,  At surgeon's request and post-op pain management  Laterality: Right  Prep: chloraprep       Needles:  Injection technique: Single-shot  Needle Type: Echogenic Stimulator Needle     Needle Length:cm 9 cm Needle Gauge: 22 and 22 G    Additional Needles:  Procedures: ultrasound guided (picture in chart)  Narrative:  Start time: 06/08/2012 5:00 PM End time: 06/08/2012 5:10 PM  Performed by: Personally   Additional Notes: R. Mid-thigh saphenous nerve block  25 cc 0.5% Marcaine with 1:200 Epi in PACU with excellent pain relief  Kipp Brood, MD

## 2012-06-08 NOTE — Progress Notes (Signed)
Pt. C/o of 10/10 pain.  Dr. Noreene Larsson at bedside.  To do Saphinous nerve block.

## 2012-06-11 ENCOUNTER — Encounter (HOSPITAL_COMMUNITY): Payer: Self-pay | Admitting: Orthopedic Surgery

## 2012-07-27 ENCOUNTER — Emergency Department (HOSPITAL_COMMUNITY)
Admission: EM | Admit: 2012-07-27 | Discharge: 2012-07-27 | Discharge status: Against Medical Advice | Payer: BC Managed Care – PPO | Attending: Emergency Medicine | Admitting: Emergency Medicine

## 2012-07-27 ENCOUNTER — Encounter (HOSPITAL_COMMUNITY): Payer: Self-pay | Admitting: *Deleted

## 2012-07-27 DIAGNOSIS — R0789 Other chest pain: Secondary | ICD-10-CM | POA: Insufficient documentation

## 2012-07-27 DIAGNOSIS — R1013 Epigastric pain: Secondary | ICD-10-CM | POA: Insufficient documentation

## 2012-07-27 LAB — CBC WITH DIFFERENTIAL/PLATELET
Basophils Absolute: 0 10*3/uL (ref 0.0–0.1)
Eosinophils Absolute: 0 10*3/uL (ref 0.0–0.7)
Eosinophils Relative: 0 % (ref 0–5)
HCT: 38.3 % (ref 36.0–46.0)
Lymphocytes Relative: 2 % — ABNORMAL LOW (ref 12–46)
MCH: 27.1 pg (ref 26.0–34.0)
MCHC: 32.6 g/dL (ref 30.0–36.0)
MCV: 82.9 fL (ref 78.0–100.0)
Monocytes Absolute: 0.2 10*3/uL (ref 0.1–1.0)
RDW: 13.3 % (ref 11.5–15.5)
WBC: 10.9 10*3/uL — ABNORMAL HIGH (ref 4.0–10.5)

## 2012-07-27 LAB — COMPREHENSIVE METABOLIC PANEL
AST: 640 U/L — ABNORMAL HIGH (ref 0–37)
CO2: 23 mEq/L (ref 19–32)
Calcium: 9.1 mg/dL (ref 8.4–10.5)
Creatinine, Ser: 0.75 mg/dL (ref 0.50–1.10)
GFR calc Af Amer: >90 mL/min (ref >90)
GFR calc non Af Amer: >90 mL/min (ref >90)
Total Protein: 6.8 g/dL (ref 6.0–8.3)

## 2012-07-27 LAB — TROPONIN I: Troponin I: <0.3 ng/mL (ref <0.30)

## 2012-07-27 NOTE — ED Notes (Signed)
Unable to locate pt in ED Lobby.  Called 3 times

## 2012-07-27 NOTE — ED Notes (Signed)
The pt  Reports that she cannot walk unable to get a urine spec

## 2012-07-27 NOTE — ED Notes (Signed)
The pt has been c/o epigastric pain and hyperventilating since 1700 today.  No nv cautioned to slow her respirations..  The pt does not have a gb it was removed 2004.  No previous history.lmp  none

## 2014-01-09 ENCOUNTER — Encounter (HOSPITAL_COMMUNITY): Admission: RE | Payer: Self-pay | Source: Ambulatory Visit

## 2014-01-09 ENCOUNTER — Ambulatory Visit (HOSPITAL_COMMUNITY)
Admission: RE | Admit: 2014-01-09 | Payer: BC Managed Care – PPO | Source: Ambulatory Visit | Admitting: Obstetrics and Gynecology

## 2014-01-09 SURGERY — HYSTEROSCOPY
Anesthesia: Choice

## 2014-02-20 ENCOUNTER — Other Ambulatory Visit: Payer: Self-pay | Admitting: Obstetrics and Gynecology

## 2014-02-20 ENCOUNTER — Encounter (HOSPITAL_COMMUNITY): Payer: Self-pay | Admitting: *Deleted

## 2014-02-20 ENCOUNTER — Encounter (HOSPITAL_COMMUNITY): Payer: Self-pay | Admitting: Pharmacist

## 2014-03-05 ENCOUNTER — Encounter (HOSPITAL_COMMUNITY): Payer: Self-pay | Admitting: Anesthesiology

## 2014-03-06 ENCOUNTER — Encounter (HOSPITAL_COMMUNITY): Payer: Self-pay

## 2014-03-06 ENCOUNTER — Encounter (HOSPITAL_COMMUNITY): Payer: BC Managed Care – PPO | Admitting: Anesthesiology

## 2014-03-06 ENCOUNTER — Ambulatory Visit (HOSPITAL_COMMUNITY): Payer: BC Managed Care – PPO | Admitting: Anesthesiology

## 2014-03-06 ENCOUNTER — Encounter (HOSPITAL_COMMUNITY)
Admission: RE | Disposition: A | Discharge status: Home / Self Care | Payer: Self-pay | Source: Ambulatory Visit | Attending: Obstetrics and Gynecology

## 2014-03-06 ENCOUNTER — Ambulatory Visit (HOSPITAL_COMMUNITY)
Admission: RE | Admit: 2014-03-06 | Discharge: 2014-03-06 | Disposition: A | Discharge status: Home / Self Care | Payer: BC Managed Care – PPO | Source: Ambulatory Visit | Attending: Obstetrics and Gynecology | Admitting: Obstetrics and Gynecology

## 2014-03-06 DIAGNOSIS — N854 Malposition of uterus: Secondary | ICD-10-CM | POA: Insufficient documentation

## 2014-03-06 DIAGNOSIS — Z30432 Encounter for removal of intrauterine contraceptive device: Secondary | ICD-10-CM | POA: Insufficient documentation

## 2014-03-06 DIAGNOSIS — T8332XA Displacement of intrauterine contraceptive device, initial encounter: Secondary | ICD-10-CM

## 2014-03-06 HISTORY — PX: HYSTEROSCOPY W/D&C: SHX1775

## 2014-03-06 LAB — CBC
HCT: 39.4 % (ref 36.0–46.0)
HEMOGLOBIN: 13.1 g/dL (ref 12.0–15.0)
MCH: 27.8 pg (ref 26.0–34.0)
MCHC: 33.2 g/dL (ref 30.0–36.0)
MCV: 83.7 fL (ref 78.0–100.0)
PLATELETS: 225 10*3/uL (ref 150–400)
RBC: 4.71 MIL/uL (ref 3.87–5.11)
RDW: 13.6 % (ref 11.5–15.5)
WBC: 5.6 10*3/uL (ref 4.0–10.5)

## 2014-03-06 SURGERY — DILATATION AND CURETTAGE /HYSTEROSCOPY
Anesthesia: General | Site: Vagina

## 2014-03-06 MED ORDER — SCOPOLAMINE 1 MG/3DAYS TD PT72
1.0000 | MEDICATED_PATCH | Freq: Once | TRANSDERMAL | Status: DC
  Administered 2014-03-06: 1.5 mg via TRANSDERMAL

## 2014-03-06 MED ORDER — MEPERIDINE HCL 25 MG/ML IJ SOLN: 6.2500 mg | INTRAMUSCULAR | Status: DC | PRN

## 2014-03-06 MED ORDER — ONDANSETRON HCL 4 MG/2ML IJ SOLN
INTRAMUSCULAR | Status: DC | PRN
  Administered 2014-03-06: 4 mg via INTRAVENOUS

## 2014-03-06 MED ORDER — KETOROLAC TROMETHAMINE 30 MG/ML IJ SOLN
INTRAMUSCULAR | Status: AC
  Administered 2014-03-06: 30 mg via INTRAVENOUS
  Filled 2014-03-06: qty 1

## 2014-03-06 MED ORDER — PROPOFOL 10 MG/ML IV EMUL
INTRAVENOUS | Status: AC
  Filled 2014-03-06: qty 20

## 2014-03-06 MED ORDER — MIDAZOLAM HCL 2 MG/2ML IJ SOLN: 0.5000 mg | Freq: Once | INTRAMUSCULAR | Status: DC | PRN

## 2014-03-06 MED ORDER — KETOROLAC TROMETHAMINE 30 MG/ML IJ SOLN
15.0000 mg | Freq: Once | INTRAMUSCULAR | Status: AC | PRN
  Administered 2014-03-06: 30 mg via INTRAVENOUS

## 2014-03-06 MED ORDER — PROMETHAZINE HCL 25 MG/ML IJ SOLN: 6.2500 mg | INTRAMUSCULAR | Status: DC | PRN

## 2014-03-06 MED ORDER — LACTATED RINGERS IV SOLN
INTRAVENOUS | Status: DC
  Administered 2014-03-06: 10:00:00 via INTRAVENOUS

## 2014-03-06 MED ORDER — FENTANYL CITRATE 0.05 MG/ML IJ SOLN
INTRAMUSCULAR | Status: AC
  Filled 2014-03-06: qty 5

## 2014-03-06 MED ORDER — LIDOCAINE HCL (CARDIAC) 20 MG/ML IV SOLN
INTRAVENOUS | Status: AC
  Filled 2014-03-06: qty 5

## 2014-03-06 MED ORDER — DEXAMETHASONE SODIUM PHOSPHATE 4 MG/ML IJ SOLN
INTRAMUSCULAR | Status: DC | PRN
  Administered 2014-03-06: 8 mg via INTRAVENOUS

## 2014-03-06 MED ORDER — ONDANSETRON HCL 4 MG/2ML IJ SOLN
INTRAMUSCULAR | Status: AC
Start: 2014-03-06 — End: 2014-03-06
  Filled 2014-03-06: qty 2

## 2014-03-06 MED ORDER — FENTANYL CITRATE 0.05 MG/ML IJ SOLN
INTRAMUSCULAR | Status: DC | PRN
  Administered 2014-03-06 (×2): 50 ug via INTRAVENOUS

## 2014-03-06 MED ORDER — FENTANYL CITRATE 0.05 MG/ML IJ SOLN: 25.0000 ug | INTRAMUSCULAR | Status: DC | PRN

## 2014-03-06 MED ORDER — MIDAZOLAM HCL 2 MG/2ML IJ SOLN
INTRAMUSCULAR | Status: DC | PRN
  Administered 2014-03-06: 2 mg via INTRAVENOUS

## 2014-03-06 MED ORDER — LIDOCAINE HCL (CARDIAC) 20 MG/ML IV SOLN
INTRAVENOUS | Status: DC | PRN
  Administered 2014-03-06: 100 mg via INTRAVENOUS

## 2014-03-06 MED ORDER — SCOPOLAMINE 1 MG/3DAYS TD PT72
MEDICATED_PATCH | TRANSDERMAL | Status: AC
  Filled 2014-03-06: qty 1

## 2014-03-06 MED ORDER — DEXAMETHASONE SODIUM PHOSPHATE 10 MG/ML IJ SOLN
INTRAMUSCULAR | Status: AC
  Filled 2014-03-06: qty 1

## 2014-03-06 MED ORDER — CHLOROPROCAINE HCL 1 % IJ SOLN
INTRAMUSCULAR | Status: AC
  Filled 2014-03-06: qty 30

## 2014-03-06 MED ORDER — MIDAZOLAM HCL 2 MG/2ML IJ SOLN
INTRAMUSCULAR | Status: AC
  Filled 2014-03-06: qty 2

## 2014-03-06 MED ORDER — PROPOFOL INFUSION 10 MG/ML OPTIME
INTRAVENOUS | Status: DC | PRN
  Administered 2014-03-06: 200 mL via INTRAVENOUS
  Administered 2014-03-06: 100 mL via INTRAVENOUS

## 2014-03-06 SURGICAL SUPPLY — 18 items
CANISTER SUCT 3000ML (MISCELLANEOUS) ×3 IMPLANT
CATH ROBINSON RED A/P 16FR (CATHETERS) ×3 IMPLANT
CLOTH BEACON ORANGE TIMEOUT ST (SAFETY) ×3 IMPLANT
CONTAINER PREFILL 10% NBF 60ML (FORM) ×6 IMPLANT
DRAPE HYSTEROSCOPY (DRAPE) ×3 IMPLANT
ELECT REM PT RETURN 9FT ADLT (ELECTROSURGICAL) ×3
ELECTRODE REM PT RTRN 9FT ADLT (ELECTROSURGICAL) ×1 IMPLANT
GLOVE BIOGEL PI IND STRL 7.0 (GLOVE) ×2 IMPLANT
GLOVE BIOGEL PI INDICATOR 7.0 (GLOVE) ×4
GLOVE ECLIPSE 6.5 STRL STRAW (GLOVE) ×3 IMPLANT
GOWN STRL REUS W/TWL LRG LVL3 (GOWN DISPOSABLE) ×6 IMPLANT
LOOP ANGLED CUTTING 22FR (CUTTING LOOP) IMPLANT
PACK VAGINAL MINOR WOMEN LF (CUSTOM PROCEDURE TRAY) ×3 IMPLANT
PAD OB MATERNITY 4.3X12.25 (PERSONAL CARE ITEMS) ×3 IMPLANT
SET TUBING HYSTEROSCOPY 2 NDL (TUBING) IMPLANT
TOWEL OR 17X24 6PK STRL BLUE (TOWEL DISPOSABLE) ×6 IMPLANT
TUBE HYSTEROSCOPY W Y-CONNECT (TUBING) IMPLANT
WATER STERILE IRR 1000ML POUR (IV SOLUTION) ×3 IMPLANT

## 2014-03-06 NOTE — Transfer of Care (Signed)
Immediate Anesthesia Transfer of Care Note  Patient: [name]  Procedure(s) Performed: Procedure(s): DILATATION AND CURETTAGE With IUD Removal (N/A)  Patient Location: PACU  Anesthesia Type:General  Level of Consciousness: awake, alert  and oriented  Airway & Oxygen Therapy: Patient Spontanous Breathing and Patient connected to nasal cannula oxygen  Post-op Assessment: Report given to PACU RN and Post -op Vital signs reviewed and stable  Post vital signs: Reviewed and stable  Complications: No apparent anesthesia complications

## 2014-03-06 NOTE — Anesthesia Procedure Notes (Signed)
Procedure Name: LMA Insertion Date/Time: 03/06/2014 10:53 AM Performed by: Flossie Dibble Pre-anesthesia Checklist: Emergency Drugs available, Timeout performed, Suction available, Patient identified and Patient being monitored Patient Re-evaluated:Patient Re-evaluated prior to inductionOxygen Delivery Method: Circle system utilized Preoxygenation: Pre-oxygenation with 100% oxygen Intubation Type: IV induction Ventilation: Mask ventilation without difficulty LMA: LMA inserted LMA Size: 4.0 Number of attempts: 2 Placement Confirmation: breath sounds checked- equal and bilateral and positive ETCO2 Tube secured with: Tape Dental Injury: Teeth and Oropharynx as per pre-operative assessment

## 2014-03-06 NOTE — Anesthesia Postprocedure Evaluation (Signed)
Anesthesia Post Note  Patient: [name]  Procedure(s) Performed: Procedure(s) (LRB): DILATATION AND CURETTAGE With IUD Removal (N/A)  Anesthesia type: General  Patient location: PACU  Post pain: Pain level controlled  Post assessment: Post-op Vital signs reviewed  Last Vitals:  Filed Vitals:   03/06/14 1130  BP: 114/89  Pulse: 72  Temp:   Resp: 12    Post vital signs: Reviewed  Level of consciousness: sedated  Complications: No apparent anesthesia complications

## 2014-03-06 NOTE — H&P (Signed)
[name] is an 38 y.o. female. [id number] MBF  Presents for removal of retained IUD. Failed removal by ultrasound guidance in office   Pertinent Gynecological History: Menses: none Bleeding: none Contraception: IUD DES exposure: denies Blood transfusions: none Sexually transmitted diseases: no past history Previous GYN Procedures: DNC  Last mammogram: n/a Date: na Last pap: normal Date: 2013 neg HPV OB History: G5, P3   Menstrual History:  No LMP recorded. Patient is not currently having periods (Reason: IUD).    History reviewed. No pertinent past medical history.  Past Surgical History  Procedure Laterality Date  . Appendectomy    . Cholecystectomy    . Knee surgery      right knee arthroscropic  . Achilles tendon surgery  06/08/2012    Procedure: ACHILLES TENDON REPAIR;  Surgeon: Newt Minion, MD;  Location: [facility];  Service: Orthopedics;  Laterality: Right;  Right Achilles Reconstruction  . Abdominoplasty      History reviewed. No pertinent family history.  Social History:  reports that she has never smoked. She does not have any smokeless tobacco history on file. She reports that she does not drink alcohol or use illicit drugs.  Allergies: No Known Allergies  No prescriptions prior to admission    ROS  There were no vitals taken for this visit. Physical Exam  Constitutional: She is oriented to person, place, and time. She appears well-developed and well-nourished.  Eyes: EOM are normal.  Neck: Neck supple.  Cardiovascular: Normal rate and regular rhythm.   Respiratory: Breath sounds normal.  GI: Bowel sounds are normal.  Musculoskeletal: She exhibits no edema.  Neurological: She is alert and oriented to person, place, and time.  Skin: Skin is warm and dry.  Psychiatric: She has a normal mood and affect.    No results found for this or any previous visit (from the past 24 hour(s)).  No results found.  Assessment/Plan: Retained IUD P) dx  hysteroscopy,  Removal IUD Procedure explained. Risk of surgery reviewed. Infection, bleeding, uterine perforation  [name] A 03/06/2014, 7:17 AM

## 2014-03-06 NOTE — Discharge Instructions (Signed)
Return for IUD insertion office am. No intercourse. Can use heating pad to abdomen. Increased oral intake , particularly water next 48 hours. Use ibuprofen , motrin or advil for cramps and pain.DISCHARGE INSTRUCTIONS: D&C / D&E The following instructions have been prepared to help you care for yourself upon your return home.   Personal hygiene:  Use sanitary pads for vaginal drainage, not tampons.  Shower the day after your procedure.  NO tub baths, pools or Jacuzzis for 2-3 weeks.  Wipe front to back after using the bathroom.  Activity and limitations:  Do NOT drive or operate any equipment for 24 hours. The effects of anesthesia are still present and drowsiness may result.  Do NOT rest in bed all day.  Walking is encouraged.  Walk up and down stairs slowly.  You may resume your normal activity in one to two days or as indicated by your physician.  Sexual activity: NO intercourse for at least 2 weeks after the procedure, or as indicated by your physician.  Diet: Eat a light meal as desired this evening. You may resume your usual diet tomorrow.  Return to work: You may resume your work activities in one to two days or as indicated by your doctor.  What to expect after your surgery: Expect to have vaginal bleeding/discharge for 2-3 days and spotting for up to 10 days. It is not unusual to have soreness for up to 1-2 weeks. You may have a slight burning sensation when you urinate for the first day. Mild cramps may continue for a couple of days. You may have a regular period in 2-6 weeks.  Call your doctor for any of the following:  Excessive vaginal bleeding, saturating and changing one pad every hour.  Inability to urinate 6 hours after discharge from hospital.  Pain not relieved by pain medication.  Fever of 100.4 F or greater.  Unusual vaginal discharge or odor.   Call for an appointment:    Patients signature: ______________________  Nurses signature  ________________________  Support person's signature_______________________

## 2014-03-06 NOTE — Anesthesia Preprocedure Evaluation (Signed)
Anesthesia Evaluation  Patient identified by MRN, date of birth, ID band Patient awake    Reviewed: Allergy & Precautions, H&P , Patient's Chart, lab work & pertinent test results, reviewed documented beta blocker date and time   History of Anesthesia Complications Negative for: history of anesthetic complications  Airway Mallampati: II  TM Distance: >3 FB Neck ROM: full    Dental   Pulmonary  breath sounds clear to auscultation        Cardiovascular Exercise Tolerance: Good Rhythm:regular Rate:Normal     Neuro/Psych negative psych ROS   GI/Hepatic   Endo/Other    Renal/GU      Musculoskeletal   Abdominal   Peds  Hematology   Anesthesia Other Findings   Reproductive/Obstetrics                             Anesthesia Physical Anesthesia Plan  ASA: I  Anesthesia Plan: General LMA   Post-op Pain Management:    Induction:   Airway Management Planned:   Additional Equipment:   Intra-op Plan:   Post-operative Plan:   Informed Consent: I have reviewed the patients History and Physical, chart, labs and discussed the procedure including the risks, benefits and alternatives for the proposed anesthesia with the patient or authorized representative who has indicated his/her understanding and acceptance.   Dental Advisory Given  Plan Discussed with: CRNA, Surgeon and Anesthesiologist  Anesthesia Plan Comments:         Anesthesia Quick Evaluation  

## 2014-03-06 NOTE — Brief Op Note (Signed)
03/06/2014  11:23 AM  PATIENT:  Jamison Oka  38 y.o. female  PRE-OPERATIVE DIAGNOSIS:  Retained IUD  POST-OPERATIVE DIAGNOSIS:  Retained IUD  PROCEDURE:  Removal of IUD  SURGEON:  Surgeon(s) and Role:    * Schwanda Zima Clint Bolder, MD - Primary  PHYSICIAN ASSISTANT:   ASSISTANTS: none   ANESTHESIA:   general Findings: IUD string not seen.  EBL:  Total I/O In: 600 [I.V.:600] Out: 600 [Urine:600]  BLOOD ADMINISTERED:none  DRAINS: none   LOCAL MEDICATIONS USED:  NONE  SPECIMEN:  No Specimen  DISPOSITION OF SPECIMEN:  N/A  COUNTS:  YES  TOURNIQUET:  * No tourniquets in log *  DICTATION: .Other Dictation: Dictation Number [id number]  PLAN OF CARE: Discharge to home after PACU  PATIENT DISPOSITION:  PACU - hemodynamically stable.   Delay start of Pharmacological VTE agent (>24hrs) due to surgical blood loss or risk of bleeding: no

## 2014-03-07 NOTE — Op Note (Signed)
[name], [name]           ACCOUNT NO.:  [id number]  MEDICAL RECORD NO.:  [id number]  LOCATION:  [facility]                          FACILITY:  [facility]  PHYSICIAN:  Servando Salina, M.D.DATE OF BIRTH:  Apr 23, 1976  DATE OF PROCEDURE:  03/06/2014 DATE OF DISCHARGE:  03/06/2014                              OPERATIVE REPORT   PREOPERATIVE DIAGNOSIS:  Retained intrauterine device.  PROCEDURE:  Removal of intrauterine device.  POSTOPERATIVE DIAGNOSIS:  Retained intrauterine device.  ANESTHESIA:  General.  SURGEON:  Servando Salina, M.D.  ASSISTANT:  None.  DESCRIPTION OF PROCEDURE:  Under adequate general anesthesia, the patient was placed in the dorsal lithotomy position.  She was sterilely prepped and draped in usual fashion.  Bladder was catheterized for moderate amount of urine.  Examination under anesthesia revealed an anteverted uterus.  No adnexal masses could be appreciated.  A bivalve speculum was placed in the vagina.  IUD string was not visible.  Single- tooth tenaculum was placed on the anterior lip of the cervix.  Using the polyp forceps, the uterine cavity was explored x2 with subsequent removal of the IUD without incident and intact.  The procedure was then terminated by removal of all instruments.  SPECIMEN:  None.  ESTIMATED BLOOD LOSS:  Minimal.  COMPLICATIONS:  None.  DISPOSITION:  The patient tolerated the procedure well, was transferred to the recovery room in stable condition.     Servando Salina, M.D.     [place]/MEDQ  D:  03/06/2014  T:  03/07/2014  Job:  [id number]

## 2014-03-08 ENCOUNTER — Encounter (HOSPITAL_COMMUNITY): Payer: Self-pay | Admitting: Obstetrics and Gynecology

## 2014-04-04 ENCOUNTER — Ambulatory Visit
Admission: RE | Admit: 2014-04-04 | Discharge: 2014-04-04 | Disposition: A | Discharge status: Home / Self Care | Payer: BC Managed Care – PPO | Source: Ambulatory Visit | Attending: Obstetrics and Gynecology | Admitting: Obstetrics and Gynecology

## 2014-04-04 ENCOUNTER — Other Ambulatory Visit: Payer: Self-pay | Admitting: Obstetrics and Gynecology

## 2014-04-04 DIAGNOSIS — Z8611 Personal history of tuberculosis: Secondary | ICD-10-CM

## 2015-05-29 ENCOUNTER — Other Ambulatory Visit: Payer: Self-pay | Admitting: Obstetrics and Gynecology

## 2015-05-29 ENCOUNTER — Ambulatory Visit
Admission: RE | Admit: 2015-05-29 | Discharge: 2015-05-29 | Disposition: A | Discharge status: Home / Self Care | Payer: BC Managed Care – PPO | Source: Ambulatory Visit | Attending: Obstetrics and Gynecology | Admitting: Obstetrics and Gynecology

## 2015-05-29 DIAGNOSIS — Z8611 Personal history of tuberculosis: Secondary | ICD-10-CM

## 2016-05-03 ENCOUNTER — Ambulatory Visit
Admission: RE | Admit: 2016-05-03 | Discharge: 2016-05-03 | Disposition: A | Discharge status: Home / Self Care | Payer: BC Managed Care – PPO | Source: Ambulatory Visit | Attending: Obstetrics and Gynecology | Admitting: Obstetrics and Gynecology

## 2016-05-03 ENCOUNTER — Other Ambulatory Visit: Payer: Self-pay | Admitting: Obstetrics and Gynecology

## 2016-05-03 DIAGNOSIS — Z8611 Personal history of tuberculosis: Secondary | ICD-10-CM

## 2018-05-14 LAB — HEPATIC FUNCTION PANEL
ALT: 22 (ref 7–35)
AST: 17 (ref 13–35)
Alkaline Phosphatase: 42 (ref 25–125)
Bilirubin, Total: 0.5

## 2018-05-14 LAB — LIPID PANEL
CHOLESTEROL: 191 (ref 0–200)
HDL: 40 (ref 35–70)
LDL Cholesterol: 129
Triglycerides: 110 (ref 40–160)

## 2018-05-14 LAB — HEMOGLOBIN A1C: HEMOGLOBIN A1C: 5.6

## 2018-05-14 LAB — BASIC METABOLIC PANEL
BUN: 9 (ref 4–21)
Creatinine: 1 (ref 0.5–1.1)
Glucose: 92
Potassium: 4.1 (ref 3.4–5.3)
Sodium: 141 (ref 137–147)

## 2018-05-14 LAB — CBC AND DIFFERENTIAL
HCT: 41 (ref 36–46)
Hemoglobin: 13.3 (ref 12.0–16.0)
PLATELETS: 277 (ref 150–399)
WBC: 5.9

## 2018-05-14 LAB — HM MAMMOGRAPHY

## 2018-05-14 LAB — HM PAP SMEAR: HM Pap smear: NEGATIVE

## 2018-05-14 LAB — TSH: TSH: 2.46 (ref 0.41–5.90)

## 2018-05-14 LAB — PSA: PSA: NEGATIVE

## 2018-08-09 ENCOUNTER — Other Ambulatory Visit: Payer: Self-pay

## 2018-08-09 ENCOUNTER — Emergency Department
Admission: EM | Admit: 2018-08-09 | Discharge: 2018-08-09 | Disposition: A | Discharge status: Home / Self Care | Payer: BC Managed Care – PPO | Attending: Emergency Medicine | Admitting: Emergency Medicine

## 2018-08-09 ENCOUNTER — Encounter: Payer: Self-pay | Admitting: Family Medicine

## 2018-08-09 ENCOUNTER — Emergency Department: Payer: BC Managed Care – PPO

## 2018-08-09 ENCOUNTER — Ambulatory Visit: Payer: BC Managed Care – PPO | Admitting: Family Medicine

## 2018-08-09 VITALS — BP 160/118 | HR 70 | Temp 98.5°F | Ht 69.0 in | Wt 307.0 lb

## 2018-08-09 DIAGNOSIS — I160 Hypertensive urgency: Secondary | ICD-10-CM | POA: Diagnosis not present

## 2018-08-09 DIAGNOSIS — R079 Chest pain, unspecified: Secondary | ICD-10-CM | POA: Diagnosis present

## 2018-08-09 DIAGNOSIS — I10 Essential (primary) hypertension: Secondary | ICD-10-CM | POA: Diagnosis not present

## 2018-08-09 DIAGNOSIS — Z79899 Other long term (current) drug therapy: Secondary | ICD-10-CM | POA: Diagnosis not present

## 2018-08-09 HISTORY — DX: Essential (primary) hypertension: I10

## 2018-08-09 LAB — BASIC METABOLIC PANEL
ANION GAP: 5 (ref 5–15)
BUN: 13 mg/dL (ref 6–20)
CHLORIDE: 107 mmol/L (ref 98–111)
CO2: 24 mmol/L (ref 22–32)
Calcium: 9 mg/dL (ref 8.9–10.3)
Creatinine, Ser: 1.03 mg/dL — ABNORMAL HIGH (ref 0.44–1.00)
GFR calc Af Amer: >60 mL/min (ref >60)
GFR calc non Af Amer: >60 mL/min (ref >60)
Glucose, Bld: 95 mg/dL (ref 70–99)
POTASSIUM: 4.1 mmol/L (ref 3.5–5.1)
Sodium: 136 mmol/L (ref 135–145)

## 2018-08-09 LAB — URINALYSIS, COMPLETE (UACMP) WITH MICROSCOPIC
BACTERIA UA: NONE SEEN
BILIRUBIN URINE: NEGATIVE
GLUCOSE, UA: NEGATIVE mg/dL
HGB URINE DIPSTICK: NEGATIVE
Ketones, ur: NEGATIVE mg/dL
LEUKOCYTES UA: NEGATIVE
Nitrite: NEGATIVE
Protein, ur: NEGATIVE mg/dL
SPECIFIC GRAVITY, URINE: 1.017 (ref 1.005–1.030)
pH: 5 (ref 5.0–8.0)

## 2018-08-09 LAB — CBC
HEMATOCRIT: 39.7 % (ref 36.0–46.0)
Hemoglobin: 12.5 g/dL (ref 12.0–15.0)
MCH: 26.5 pg (ref 26.0–34.0)
MCHC: 31.5 g/dL (ref 30.0–36.0)
MCV: 84.3 fL (ref 80.0–100.0)
Platelets: 267 10*3/uL (ref 150–400)
RBC: 4.71 MIL/uL (ref 3.87–5.11)
RDW: 13.5 % (ref 11.5–15.5)
WBC: 5.6 10*3/uL (ref 4.0–10.5)
nRBC: 0 % (ref 0.0–0.2)

## 2018-08-09 LAB — TROPONIN I: Troponin I: <0.03 ng/mL (ref <0.03)

## 2018-08-09 LAB — POCT PREGNANCY, URINE: PREG TEST UR: NEGATIVE

## 2018-08-09 MED ORDER — HYDROCHLOROTHIAZIDE 12.5 MG PO TABS: 12.5000 mg | ORAL_TABLET | Freq: Every day | ORAL | 1 refills | Status: DC

## 2018-08-09 MED ORDER — HYDROCHLOROTHIAZIDE 12.5 MG PO CAPS
12.5000 mg | ORAL_CAPSULE | Freq: Once | ORAL | Status: AC
  Administered 2018-08-09: 12.5 mg via ORAL
  Filled 2018-08-09: qty 1

## 2018-08-09 NOTE — Patient Instructions (Signed)
You should go to ER to get worked up with your high blood pressure

## 2018-08-09 NOTE — ED Triage Notes (Signed)
Pt states that 2 weeks ago she started with a headache (no hx of HTN) - she went to health at work appt and they reported HTN - she went to urgent care and they referred to primary - went to PCP and they advised her to come to the ED Pt reports that she has had chest pain, shortness of breath, and right arm discomfort for the past month off and on

## 2018-08-09 NOTE — Discharge Instructions (Addendum)

## 2018-08-09 NOTE — ED Provider Notes (Signed)
[facility] Emergency Department Provider Note  ____________________________________________  Time seen: Approximately 10:07 AM  I have reviewed the triage vital signs and the nursing notes.   HISTORY  Chief Complaint Chest Pain and Hypertension   HPI [name] is a 43 y.o. female with no significant past medical history who presents from her primary care doctor's office for evaluation of hypertension and chest pain.  Patient reports that she has noted several episodes of chest pain mostly once a week over the last several months.  She describes as pressure in the center of her chest radiating down her left arm.  The pain happens both at rest and with exertion.  She has no dizziness, shortness of breath, nausea or diaphoresis associated with these episodes.  She denies personal or family history of heart attacks.  She does have family history of hypertension.  She reports that last week she underwent a doctor's evaluation for a new job and she was found to be hypertensive.  They recommended that she followed up with primary care doctor.  Patient reports that she follow-up with her primary care doctor today and was found to be hypertensive again.  When she mentioned that she was having these episodes of chest pain the PCP sent her to the emergency room for evaluation.  Patient reports that her last chest pain was 2 weeks ago.  She has no chest pain at this time.  Patient also complains of daily headaches which she has had for the last 2 weeks.  Today she has no headache.  No thunderclap headache, no neurological deficits associated with her headaches.  Last headache was yesterday evening.   Past Medical History:  Diagnosis Date  . Hypertension     There are no active problems to display for this patient.   Past Surgical History:  Procedure Laterality Date  . ABDOMINOPLASTY    . ACHILLES TENDON SURGERY  06/08/2012   Procedure: ACHILLES TENDON REPAIR;   Surgeon: Newt Minion, MD;  Location: [place];  Service: Orthopedics;  Laterality: Right;  Right Achilles Reconstruction  . APPENDECTOMY    . CHOLECYSTECTOMY    . HYSTEROSCOPY W/D&C N/A 03/06/2014   Procedure: DILATATION AND CURETTAGE With IUD Removal;  Surgeon: Marvene Staff, MD;  Location: [facility];  Service: Gynecology;  Laterality: N/A;  . KNEE SURGERY     right knee arthroscropic    Prior to Admission medications   Medication Sig Start Date End Date Taking? Authorizing Provider  hydrochlorothiazide (HYDRODIURIL) 12.5 MG tablet Take 1 tablet (12.5 mg total) by mouth daily. 08/09/18   Rudene Re, MD  levonorgestrel [facility]) 20 MCG/24HR IUD 1 each by Intrauterine route once.    [provider]  zolpidem (AMBIEN CR) 6.25 MG CR tablet Take by mouth. 08/04/18 09/03/18  [provider]    Allergies Patient has no known allergies.  Family History  Problem Relation Age of Onset  . Diabetes Mother   . Hypertension Mother   . Diabetes Father   . Hypertension Father     Social History Social History   Tobacco Use  . Smoking status: Never Smoker  . Smokeless tobacco: Never Used  Substance Use Topics  . Alcohol use: Yes    Comment: once a month, 1-2 servings  . Drug use: No    Review of Systems  Constitutional: Negative for fever. Eyes: Negative for visual changes. ENT: Negative for sore throat. Neck: No neck pain  Cardiovascular: + chest pain. Respiratory:  Negative for shortness of breath. Gastrointestinal: Negative for abdominal pain, vomiting or diarrhea. Genitourinary: Negative for dysuria. Musculoskeletal: Negative for back pain. Skin: Negative for rash. Neurological: Negative for weakness or numbness. + HA Psych: No SI or HI  ____________________________________________   PHYSICAL EXAM:  VITAL SIGNS: ED Triage Vitals  Enc Vitals Group     BP 08/09/18 0950 (!) 154/103     Pulse Rate 08/09/18 0950 71     Resp 08/09/18 0950 16     Temp  08/09/18 0950 98.5 F (36.9 C)     Temp Source 08/09/18 0950 Oral     SpO2 08/09/18 0950 99 %     Weight 08/09/18 0951 (!) 307 lb (139.3 kg)     Height 08/09/18 0951 5\' 9"  (1.753 m)     Head Circumference --      Peak Flow --      Pain Score 08/09/18 0950 3     Pain Loc --      Pain Edu? --      Excl. in [place]? --     Constitutional: Alert and oriented. Well appearing and in no apparent distress. HEENT:      Head: Normocephalic and atraumatic.         Eyes: Conjunctivae are normal. Sclera is non-icteric.       Mouth/Throat: Mucous membranes are moist.       Neck: Supple with no signs of meningismus. No bruits Cardiovascular: Regular rate and rhythm. No murmurs, gallops, or rubs. 2+ symmetrical distal pulses are present in all extremities. No JVD. Respiratory: Normal respiratory effort. Lungs are clear to auscultation bilaterally. No wheezes, crackles, or rhonchi.  Gastrointestinal: Soft, non tender, and non distended with positive bowel sounds. No rebound or guarding. Musculoskeletal: Nontender with normal range of motion in all extremities. No edema, cyanosis, or erythema of extremities. Neurologic: Normal speech and language. Face is symmetric. Moving all extremities. No gross focal neurologic deficits are appreciated. Skin: Skin is warm, dry and intact. No rash noted. Psychiatric: Mood and affect are normal. Speech and behavior are normal.  ____________________________________________   LABS (all labs ordered are listed, but only abnormal results are displayed)  Labs Reviewed  BASIC METABOLIC PANEL - Abnormal; Notable for the following components:      Result Value   Creatinine, Ser 1.03 (*)    All other components within normal limits  URINALYSIS, COMPLETE (UACMP) WITH MICROSCOPIC - Abnormal; Notable for the following components:   Color, Urine YELLOW (*)    APPearance CLEAR (*)    All other components within normal limits  CBC  TROPONIN I  POC URINE PREG, ED  POCT  PREGNANCY, URINE   ____________________________________________  EKG  ED ECG REPORT I, Rudene Re, the attending physician, personally viewed and interpreted this ECG.  Normal sinus rhythm, rate of 66, normal intervals, normal axis, no ST elevations or depressions.  Q waves in V1 and V2.  Unchanged from prior from 2013 ____________________________________________  RADIOLOGY  I have personally reviewed the images performed during this visit and I agree with the Radiologist's read.   Interpretation by Radiologist:  Dg Chest 2 View  Result Date: 08/09/2018 CLINICAL DATA:  Headache and high blood pressure. Shortness of breath, chest pain and right arm discomfort. EXAM: CHEST - 2 VIEW COMPARISON:  05/03/2016 FINDINGS: Cardiomediastinal silhouette is normal. Mediastinal contours appear intact. There is no evidence of focal airspace consolidation, pleural effusion or pneumothorax. Osseous structures are without acute abnormality. Soft tissues are grossly normal.  IMPRESSION: No active cardiopulmonary disease. Electronically Signed   By: Fidela Salisbury M.D.   On: 08/09/2018 10:24     ____________________________________________   PROCEDURES  Procedure(s) performed: None Procedures Critical Care performed:  None ____________________________________________   INITIAL IMPRESSION / ASSESSMENT AND PLAN / ED COURSE   43 y.o. female with no significant past medical history who presents from her primary care doctor's office for evaluation of hypertension and chest pain.  Patient's last episode of chest pain was 2 weeks ago.  EKG and troponin x1 with no evidence of ischemia.  No need for repeat troponin with no chest pain for greater than 14 days.  Patient also has had intermittent headaches.  She is completely neurologically intact.  No headache at this time.  Blood pressure is slightly elevated and patient was started on hydrochlorothiazide since this is the third read in a week of  elevated blood pressure.  Her blood pressure has come down after being started on hydrochlorothiazide.  Recommended keeping a blood pressure diary and follow-up with primary care doctor in the beginning of next week for reevaluation.  Her labs show no evidence of endorgan damage with no proteinuria, normal kidney function, normal CBC.  Discussed lifestyle modifications.  Discussed return precautions for chest pain.      As part of my medical decision making, I reviewed the following data within the [facility] notes reviewed and incorporated, Labs reviewed , EKG interpreted , Old EKG reviewed, Old chart reviewed, Radiograph reviewed , Notes from prior ED visits and [place] Controlled Substance Database    Pertinent labs & imaging results that were available during my care of the patient were reviewed by me and considered in my medical decision making (see chart for details).    ____________________________________________   FINAL CLINICAL IMPRESSION(S) / ED DIAGNOSES  Final diagnoses:  Hypertension, unspecified type  Chest pain, unspecified type      NEW MEDICATIONS STARTED DURING THIS VISIT:  ED Discharge Orders         Ordered    hydrochlorothiazide (HYDRODIURIL) 12.5 MG tablet  Daily     08/09/18 1155           Note:  This document was prepared using Dragon voice recognition software and may include unintentional dictation errors.    Alfred Levins, [place], MD 08/09/18 [phone]

## 2018-08-09 NOTE — Progress Notes (Signed)
Subjective:     [name] is a 43 y.o. female presenting for Establish Care (no other previous PCP. Sees Dr. Garwin Brothers gyn.) and Hypertension (having headaches. High B/P for about 1 week now. Blurred vision.)     HPI  #HTN - no hx of high blood pressure - has gained weight over the last few years - has had more stress over the last few years - 1 week of symptoms - Headache - 2 weeks ago had a HA for 3 days, then improved.  - pounding HA - took goody powder and ice pack - went to the health at work appointment and told her BP was elevated - 165/124 - blurry vision - can still see OK but feels a little woozy   Endorses some pain radiating down her arm  HA at its worst was pounding and now dull and achy   Review of Systems  HENT: Positive for congestion. Negative for sinus pressure and sinus pain.   Eyes: Positive for visual disturbance.  Respiratory: Negative for cough, chest tightness and shortness of breath.   Cardiovascular: Positive for chest pain, palpitations and leg swelling.  Gastrointestinal: Negative for nausea and vomiting.  Neurological: Positive for dizziness and headaches. Negative for weakness and numbness.     Social History   Tobacco Use  Smoking Status Never Smoker  Smokeless Tobacco Never Used        Objective:    BP Readings from Last 3 Encounters:  08/09/18 (!) 160/118  03/06/14 113/75  07/27/12 100/62   Wt Readings from Last 3 Encounters:  08/09/18 (!) 307 lb (139.3 kg)  03/06/14 220 lb (99.8 kg)  06/08/12 195 lb (88.5 kg)    BP (!) 160/118   Pulse 70   Temp 98.5 F (36.9 C)   Ht 5\' 9"  (1.753 m)   Wt (!) 307 lb (139.3 kg)   SpO2 98%   BMI 45.34 kg/m    Physical Exam Constitutional:      General: She is not in acute distress.    Appearance: She is well-developed. She is not diaphoretic.  HENT:     Right Ear: External ear normal.     Left Ear: External ear normal.     Nose: Nose normal.  Eyes:   Conjunctiva/sclera: Conjunctivae normal.  Neck:     Musculoskeletal: Neck supple.  Cardiovascular:     Rate and Rhythm: Normal rate and regular rhythm.     Heart sounds: No murmur.  Pulmonary:     Effort: Pulmonary effort is normal.     Breath sounds: Normal breath sounds. No wheezing or rales.  Musculoskeletal: Normal range of motion.  Skin:    General: Skin is warm and dry.     Capillary Refill: Capillary refill takes less than 2 seconds.  Neurological:     General: No focal deficit present.     Mental Status: She is alert. Mental status is at baseline.     Cranial Nerves: No cranial nerve deficit.     Sensory: No sensory deficit.     Motor: No weakness.     Coordination: Coordination normal.  Psychiatric:        Mood and Affect: Mood normal.        Behavior: Behavior normal.           Assessment & Plan:   Problem List Items Addressed This Visit    None    Visit Diagnoses    Hypertensive urgency    -  Primary   Relevant Orders   EKG 12-Lead     Concerning for hypertensive emergency given intermittent CP, HA, and blurry vision x 1 week of symptoms with elevated blood pressure  Discussed and advised patient to the ER for rapid work-up and intervention  Call back if the ER does not start an antihypertensive medication. Would likely want to start lisinopril assuming work-up is normal.   Report called to [facility]  Return in about 1 week (around 08/16/2018) for after ER visit .  Lesleigh Noe, MD

## 2018-08-14 ENCOUNTER — Encounter: Payer: Self-pay | Admitting: Family Medicine

## 2018-08-14 ENCOUNTER — Ambulatory Visit: Payer: BC Managed Care – PPO | Admitting: Family Medicine

## 2018-08-14 ENCOUNTER — Telehealth: Payer: Self-pay

## 2018-08-14 VITALS — BP 126/94 | HR 90 | Temp 98.9°F | Ht 69.0 in | Wt 294.5 lb

## 2018-08-14 DIAGNOSIS — G4726 Circadian rhythm sleep disorder, shift work type: Secondary | ICD-10-CM | POA: Diagnosis not present

## 2018-08-14 DIAGNOSIS — I10 Essential (primary) hypertension: Secondary | ICD-10-CM | POA: Diagnosis not present

## 2018-08-14 MED ORDER — LISINOPRIL 10 MG PO TABS: 10.0000 mg | ORAL_TABLET | Freq: Every day | ORAL | 2 refills | Status: DC

## 2018-08-14 NOTE — Assessment & Plan Note (Signed)
Trial of melatonin and sleep mask. Consider mindfulness apps.

## 2018-08-14 NOTE — Assessment & Plan Note (Signed)
BP improved but still elevated and with leg cramping. Start lisinopril. Ok to stop HCTZ to see if leg cramps improve

## 2018-08-14 NOTE — Assessment & Plan Note (Signed)
Started working on diet and exercise with some weight loss. Which may be related to diuretic as well. Offered support, but has had success in the past and feels good about making lifestyle changes.

## 2018-08-14 NOTE — Telephone Encounter (Signed)
Spoke with patient today to follow up on ER visit and b/p. Appointment scheduled for today 08/14/2018 to follow up with Dr. Einar Pheasant.

## 2018-08-14 NOTE — Patient Instructions (Addendum)
#  Sleep issues - Try taking Melatonin 3 mg - 1-2 hours before planned sleep - consider getting face mask and ear plugs - black out curtains are good - Headspace and Calm   #Hypertension - Start Lisinopril 10 mg daily - can increase to 20 mg after 1 week - OK to try stopping Hydrochlorothiazide - send a mychart or call 1-2 to update me with what medications you are on and what your blood pressure is doing

## 2018-08-14 NOTE — Progress Notes (Signed)
Subjective:     [name] is a 43 y.o. female presenting for Hypertension (ER follow up. Still taking HCTZ 2 tablets daily. She is having muscle cramping, Still having periodic chest pains.)     HPI  #HTN - getting muscle cramps everywhere  - since starting the medication - changed eating habits - eating more veggies, grilled chicken - is also exercising regularly - stopped soda  #sleep issues - working 3rd shift - not a great morning person - has tried melatonin x 1 - has [place] which is helpful   Review of Systems  Constitutional: Negative for chills and fever.  Eyes: Negative for visual disturbance.  Cardiovascular: Positive for chest pain and palpitations. Negative for leg swelling.  Neurological: Negative for headaches.    08/09/2017: ER - HTN and CP - normal BMP, CBC, Troponin, UA. normal EKG and CXR. Given HCTZ with improvement in BP.    Social History   Tobacco Use  Smoking Status Never Smoker  Smokeless Tobacco Never Used        Objective:    BP Readings from Last 3 Encounters:  08/14/18 (!) 126/94  08/09/18 (!) 135/97  08/09/18 (!) 160/118   Wt Readings from Last 3 Encounters:  08/14/18 294 lb 8 oz (133.6 kg)  08/09/18 (!) 307 lb (139.3 kg)  08/09/18 (!) 307 lb (139.3 kg)    BP (!) 126/94   Pulse 90   Temp 98.9 F (37.2 C)   Ht 5\' 9"  (1.753 m)   Wt 294 lb 8 oz (133.6 kg)   SpO2 98%   BMI 43.49 kg/m    Physical Exam Constitutional:      General: She is not in acute distress.    Appearance: She is well-developed. She is not diaphoretic.  HENT:     Right Ear: External ear normal.     Left Ear: External ear normal.     Nose: Nose normal.  Eyes:     Conjunctiva/sclera: Conjunctivae normal.  Neck:     Musculoskeletal: Neck supple.  Cardiovascular:     Rate and Rhythm: Normal rate and regular rhythm.     Heart sounds: No murmur.  Pulmonary:     Effort: Pulmonary effort is normal. No respiratory distress.     Breath  sounds: Normal breath sounds. No wheezing.  Musculoskeletal:     Right lower leg: No edema.     Left lower leg: No edema.  Skin:    General: Skin is warm and dry.     Capillary Refill: Capillary refill takes less than 2 seconds.  Neurological:     Mental Status: She is alert. Mental status is at baseline.  Psychiatric:        Mood and Affect: Mood normal.        Behavior: Behavior normal.     BMP Latest Ref Rng & Units 08/09/2018 07/27/2012 06/08/2012  Glucose 70 - 99 mg/dL 95 91 87  BUN 6 - 20 mg/dL 13 8 11   Creatinine 0.44 - 1.00 mg/dL 1.03(H) 0.75 0.87  Sodium 135 - 145 mmol/L 136 135 139  Potassium 3.5 - 5.1 mmol/L 4.1 3.6 4.2  Chloride 98 - 111 mmol/L 107 102 107  CO2 22 - 32 mmol/L 24 23 26   Calcium 8.9 - 10.3 mg/dL 9.0 9.1 8.8   Lab Results  Component Value Date   WBC 5.6 08/09/2018   HGB 12.5 08/09/2018   HCT 39.7 08/09/2018   MCV 84.3 08/09/2018   PLT 267  08/09/2018          Assessment & Plan:   Problem List Items Addressed This Visit      Cardiovascular and Mediastinum   Essential hypertension - Primary    BP improved but still elevated and with leg cramping. Start lisinopril. Ok to stop HCTZ to see if leg cramps improve      Relevant Medications   lisinopril (PRINIVIL,ZESTRIL) 10 MG tablet     Other   Shift work sleep disorder    Trial of melatonin and sleep mask. Consider mindfulness apps.       Obesity, Class III, BMI 40-49.9 (morbid obesity) ([place])    Started working on diet and exercise with some weight loss. Which may be related to diuretic as well. Offered support, but has had success in the past and feels good about making lifestyle changes.         Lipids and BMP at next appointment  Return in about 4 weeks (around 09/11/2018).  Lesleigh Noe, MD

## 2018-08-24 ENCOUNTER — Encounter (INDEPENDENT_AMBULATORY_CARE_PROVIDER_SITE_OTHER): Payer: Self-pay | Admitting: Orthopedic Surgery

## 2018-08-24 ENCOUNTER — Ambulatory Visit (INDEPENDENT_AMBULATORY_CARE_PROVIDER_SITE_OTHER): Payer: BC Managed Care – PPO | Admitting: Orthopedic Surgery

## 2018-08-24 VITALS — Ht 69.0 in | Wt 294.5 lb

## 2018-08-24 DIAGNOSIS — M25512 Pain in left shoulder: Secondary | ICD-10-CM

## 2018-08-24 DIAGNOSIS — S4992XA Unspecified injury of left shoulder and upper arm, initial encounter: Secondary | ICD-10-CM | POA: Diagnosis not present

## 2018-08-26 ENCOUNTER — Encounter (INDEPENDENT_AMBULATORY_CARE_PROVIDER_SITE_OTHER): Payer: Self-pay | Admitting: Orthopedic Surgery
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
## 2018-08-29 LAB — TESTOSTERONE: Testosterone: 10

## 2018-08-29 LAB — TESTOSTERONE, FREE, DIRECT: TESTOSTERONE FREE: 1.3

## 2018-09-26 ENCOUNTER — Ambulatory Visit (INDEPENDENT_AMBULATORY_CARE_PROVIDER_SITE_OTHER): Payer: BC Managed Care – PPO | Admitting: Orthopedic Surgery

## 2018-12-04 ENCOUNTER — Encounter: Payer: Self-pay | Admitting: Family Medicine

## 2018-12-05 ENCOUNTER — Ambulatory Visit (INDEPENDENT_AMBULATORY_CARE_PROVIDER_SITE_OTHER): Payer: BC Managed Care – PPO | Admitting: Family Medicine

## 2018-12-05 ENCOUNTER — Encounter: Payer: Self-pay | Admitting: Family Medicine

## 2018-12-05 VITALS — BP 137/97 | HR 75 | Ht 69.0 in

## 2018-12-05 DIAGNOSIS — I10 Essential (primary) hypertension: Secondary | ICD-10-CM

## 2018-12-05 DIAGNOSIS — G4726 Circadian rhythm sleep disorder, shift work type: Secondary | ICD-10-CM

## 2018-12-05 MED ORDER — AMLODIPINE BESYLATE 5 MG PO TABS: 5.0000 mg | ORAL_TABLET | Freq: Every day | ORAL | 0 refills | Status: DC

## 2018-12-05 NOTE — Patient Instructions (Signed)
Start Amlodipine 5 mg   Your blood pressure high.   High blood pressure increases your risk for heart attack and stroke.    Please check your blood pressure 2-4 times a week.   To check your blood pressure 1) Sit in a quiet and relaxed place for 5 minutes 2) Make sure your feet are flat on the ground 3) Consider checking first thing in the morning   Normal blood pressure is less than 140/90 Ideally you blood pressure should be around 120/80  Other ways you can reduce your blood pressure:  1) Regular exercise -- Try to get 150 minutes (30 minutes, 5 days a week) of moderate to vigorous aerobic excercise -- Examples: brisk walking (2.5 miles per hour), water aerobics, dancing, gardening, tennis, biking slower than 10 miles per hour 2) DASH Diet - low fat meats, more fresh fruits and vegetables, whole grains, low salt 3) Quit smoking if you smoke 4) Loose 5-10% of your body weight

## 2018-12-05 NOTE — Assessment & Plan Note (Signed)
Cramping with HCTZ. Worried about allergic reaction with lisinopril so has not tried. Discussed amlodipine instead and she will start this. As not taking HCTZ anymore, no need for labs and Lipids and HgbA1c in 05/2018 were normal.

## 2018-12-05 NOTE — Assessment & Plan Note (Signed)
Uses ambien rarely. Has not tried melatonin. Recommended trying melatonin to help with sleep. As well as black out curtains and ear plugs.

## 2018-12-05 NOTE — Assessment & Plan Note (Signed)
Planning to get back to exercise and watching diet.

## 2018-12-05 NOTE — Progress Notes (Signed)
I connected with Jamison Oka on 12/05/18 at  9:40 AM EDT by video and verified that I am speaking with the correct person using two identifiers.   I discussed the limitations, risks, security and privacy concerns of performing an evaluation and management service by video and the availability of in person appointments. I also discussed with the patient that there may be a patient responsible charge related to this service. The patient expressed understanding and agreed to proceed.  Patient location: Home Provider Location: [facility] Participants: Lesleigh Noe and Jamison Oka   Subjective:     [name] is a 43 y.o. female presenting for Hypertension (has been out of her medications for about a month. Has been taking HCTZ 12.5 mg 2 daily (she was advised to do this at the hospital after her last visit with Dr. Einar Pheasant) and getting leg cramps. She has not been taking Lisinopril. )     HPI   #HTN - out of medication for 1 month - has been checking bp which has been a little high - no new symptoms - no HA, cp, sob - while on medication 120-140s/70-80 - was getting a lot of cramping with the HCTZ - wasn't sure if that was impacting - no issues with cramping now that she is not taking  #insomnia - using ambien occasionally which works - still having a hard time with sleep, but managing overall most nights - has not tried melatonin in the past  #obesity - has not been doing as good - is motivated to work on diet/exercise  Review of Systems  Constitutional: Negative for chills and fever.  Respiratory: Negative for shortness of breath.   Cardiovascular: Negative for chest pain and leg swelling.  Neurological: Negative for dizziness and headaches.     Social History   Tobacco Use  Smoking Status Never Smoker  Smokeless Tobacco Never Used        Objective:   BP Readings from Last 3 Encounters:  12/05/18 (!) 137/97  08/14/18 (!)  126/94  08/09/18 (!) 135/97   Wt Readings from Last 3 Encounters:  08/24/18 294 lb 8 oz (133.6 kg)  08/14/18 294 lb 8 oz (133.6 kg)  08/09/18 (!) 307 lb (139.3 kg)   BP (!) 137/97 Comment: per patient  Pulse 75 Comment: per patient  Ht 5\' 9"  (1.753 m)   BMI 43.49 kg/m    Physical Exam Constitutional:      Appearance: Normal appearance.  HENT:     Head: Normocephalic and atraumatic.     Right Ear: External ear normal.     Left Ear: External ear normal.     Nose: Nose normal.  Eyes:     Conjunctiva/sclera: Conjunctivae normal.  Cardiovascular:     Rate and Rhythm: Normal rate.  Pulmonary:     Effort: Pulmonary effort is normal. No respiratory distress.  Neurological:     General: No focal deficit present.     Mental Status: She is alert. Mental status is at baseline.  Psychiatric:        Mood and Affect: Mood normal.        Behavior: Behavior normal.        Thought Content: Thought content normal.        Judgment: Judgment normal.    Lab Results  Component Value Date   CHOL 191 05/14/2018   HDL 40 05/14/2018   LDLCALC 129 05/14/2018   TRIG 110 05/14/2018  Lab Results  Component Value Date   HGBA1C 5.6 05/14/2018       The 10-year ASCVD risk score Mikey Bussing DC Brooke Bonito., et al., 2013) is: 4.6%*   Values used to calculate the score:     Age: 20 years     Sex: Female     Is Non-Hispanic African American: Yes     Diabetic: No     Tobacco smoker: No     Systolic Blood Pressure: 938 mmHg     Is BP treated: Yes     HDL Cholesterol: 40 mg/dL*     Total Cholesterol: 191 mg/dL*     * - Cholesterol units were assumed for this score calculation       Assessment & Plan:   Problem List Items Addressed This Visit      Cardiovascular and Mediastinum   Essential hypertension - Primary    Cramping with HCTZ. Worried about allergic reaction with lisinopril so has not tried. Discussed amlodipine instead and she will start this. As not taking HCTZ anymore, no need for labs  and Lipids and HgbA1c in 05/2018 were normal.       Relevant Medications   amLODipine (NORVASC) 5 MG tablet     Other   Shift work sleep disorder    Uses ambien rarely. Has not tried melatonin. Recommended trying melatonin to help with sleep. As well as black out curtains and ear plugs.      Obesity, Class III, BMI 40-49.9 (morbid obesity) ([place])    Planning to get back to exercise and watching diet.           Return in about 3 weeks (around 12/26/2018) for MyChart BP check-in.  Lesleigh Noe, MD

## 2019-02-26 ENCOUNTER — Other Ambulatory Visit: Payer: Self-pay | Admitting: Family Medicine

## 2019-02-26 DIAGNOSIS — I10 Essential (primary) hypertension: Secondary | ICD-10-CM

## 2019-02-26 NOTE — Telephone Encounter (Signed)
Left message for patient to call back. Need to see how her b/p has been doing since starting Amlodipine and get b/p readings if patient has checked her b/p

## 2019-02-28 NOTE — Telephone Encounter (Signed)
Sending mychart message to the patient, called patient also

## 2019-03-05 NOTE — Telephone Encounter (Signed)
Called patient and her husband and left a message

## 2019-03-07 NOTE — Telephone Encounter (Signed)
Noted and agree with plan.

## 2019-03-07 NOTE — Telephone Encounter (Signed)
Spoke to patient by telephone and was advised that she had  chest pain around 5:30 pm yesterday that lasted a couple of hours. Patient stated that she has been having chest pain off and on for several months. Patient stated that she is not having any chest pain or symptoms at this time. Patient stated that she does continue to have muscle cramps and have had these for months also. Patient scheduled to see Dr. Diona Browner Friday at 8:20 am. Patient was advised if her chest pain returns or she develops SOB, left arm pain, numbness, weakness, slurred speech or any other symptoms before her appointment tomorrow she should go to the ER and she verbalized understanding.

## 2019-03-08 ENCOUNTER — Other Ambulatory Visit: Payer: Self-pay

## 2019-03-08 ENCOUNTER — Encounter: Payer: Self-pay | Admitting: Family Medicine

## 2019-03-08 ENCOUNTER — Ambulatory Visit: Payer: BC Managed Care – PPO | Admitting: Family Medicine

## 2019-03-08 DIAGNOSIS — I10 Essential (primary) hypertension: Secondary | ICD-10-CM

## 2019-03-08 DIAGNOSIS — R0789 Other chest pain: Secondary | ICD-10-CM | POA: Diagnosis not present

## 2019-03-08 MED ORDER — IBUPROFEN 800 MG PO TABS: 800.0000 mg | ORAL_TABLET | Freq: Three times a day (TID) | ORAL | 1 refills | Status: DC | PRN

## 2019-03-08 NOTE — Progress Notes (Signed)
Chief Complaint  Patient presents with  . Follow-up    BP  . Chest Pain    History of Present Illness:  43 year old patient of Dr. Einar Pheasant presents for follow up on HTN.  Hypertension:    Good control now on amlodipine 5 mg daily.  HCTZ caused leg cramps BP Readings from Last 3 Encounters:  03/08/19 120/80  12/05/18 (!) 137/97  08/14/18 (!) 126/94  Using medication without problems or lightheadedness:  Chest pain with exertion: None  She has been having some chest pain off and on for months. Intermittant. At rest, lasts hours.  She had discussed this with DR. Cody in past. Has not had any in last few months until few days ago.  At work lasted hours, pressure. Edema: none Short of breath: associated with CP.  No sweating associated. Average home BPs: 12/70-80 Other issues:  Cramping continued in legs and toes.Uses upper body at work...   No exercise.  The 10-year ASCVD risk score Mikey Bussing DC Brooke Bonito., et al., 2013) is: 4.6%*   Values used to calculate the score:     Age: 22 years     Sex: Female     Is Non-Hispanic African American: Yes     Diabetic: No     Tobacco smoker: No     Systolic Blood Pressure: 027 mmHg     Is BP treated: Yes     HDL Cholesterol: 40 mg/dL*     Total Cholesterol: 191 mg/dL*     * - Cholesterol units were assumed for this score calculation  Body mass index is 40.87 kg/m.   COVID 19 screen No recent travel or known exposure to COVID19 The patient denies respiratory symptoms of COVID 19 at this time.  The importance of social distancing was discussed today.   Review of Systems  Constitutional: Negative for chills and fever.  HENT: Negative for congestion and ear pain.   Eyes: Negative for pain and redness.  Respiratory: Negative for cough, sputum production and wheezing.   Cardiovascular: Positive for chest pain. Negative for palpitations and leg swelling.  Gastrointestinal: Negative for abdominal pain, blood in stool, constipation, diarrhea,  nausea and vomiting.  Genitourinary: Negative for dysuria.  Musculoskeletal: Positive for joint pain. Negative for falls and myalgias.  Skin: Negative for rash.  Neurological: Negative for dizziness.  Psychiatric/Behavioral: Negative for depression. The patient is not nervous/anxious.       Past Medical History:  Diagnosis Date  . Hypertension     reports that she has never smoked. She has never used smokeless tobacco. She reports current alcohol use. She reports that she does not use drugs.   Current Outpatient Medications:  .  amLODipine (NORVASC) 5 MG tablet, TAKE 1 TABLET BY MOUTH EVERY DAY, Disp: 90 tablet, Rfl: 1 .  ibuprofen (ADVIL) 800 MG tablet, Take 800 mg by mouth every 8 (eight) hours as needed., Disp: , Rfl:  .  levonorgestrel (MIRENA) 20 MCG/24HR IUD, 1 each by Intrauterine route once., Disp: , Rfl:  .  zolpidem (AMBIEN CR) 6.25 MG CR tablet, Take 6.25 mg by mouth at bedtime as needed. , Disp: , Rfl:    Observations/Objective: Blood pressure 120/80, pulse 70, temperature 98.3 F (36.8 C), temperature source Temporal, height 5\' 9"  (1.753 m), weight 276 lb 12 oz (125.5 kg), SpO2 97 %.  Physical Exam Constitutional:      General: She is not in acute distress.    Appearance: Normal appearance. She is well-developed. She is  not ill-appearing or toxic-appearing.  HENT:     Head: Normocephalic.     Right Ear: Hearing, tympanic membrane, ear canal and external ear normal. Tympanic membrane is not erythematous, retracted or bulging.     Left Ear: Hearing, tympanic membrane, ear canal and external ear normal. Tympanic membrane is not erythematous, retracted or bulging.     Nose: No mucosal edema or rhinorrhea.     Right Sinus: No maxillary sinus tenderness or frontal sinus tenderness.     Left Sinus: No maxillary sinus tenderness or frontal sinus tenderness.     Mouth/Throat:     Pharynx: Uvula midline.  Eyes:     General: Lids are normal. Lids are everted, no foreign bodies  appreciated.     Conjunctiva/sclera: Conjunctivae normal.     Pupils: Pupils are equal, round, and reactive to light.  Neck:     Musculoskeletal: Normal range of motion and neck supple.     Thyroid: No thyroid mass or thyromegaly.     Vascular: No carotid bruit.     Trachea: Trachea normal.  Cardiovascular:     Rate and Rhythm: Normal rate and regular rhythm.     Pulses: Normal pulses.     Heart sounds: Normal heart sounds, S1 normal and S2 normal. No murmur. No friction rub. No gallop.   Pulmonary:     Effort: Pulmonary effort is normal. No tachypnea or respiratory distress.     Breath sounds: Normal breath sounds. No decreased breath sounds, wheezing, rhonchi or rales.  Abdominal:     General: Bowel sounds are normal.     Palpations: Abdomen is soft.     Tenderness: There is no abdominal tenderness.  Skin:    General: Skin is warm and dry.     Findings: No rash.  Neurological:     Mental Status: She is alert.  Psychiatric:        Mood and Affect: Mood is not anxious or depressed.        Speech: Speech normal.        Behavior: Behavior normal. Behavior is cooperative.        Thought Content: Thought content normal.        Judgment: Judgment normal.      Assessment and Plan Atypical chest pain EKG unremarkable. NSR, no significant ST changes, no Q.  Most likely stress, MSK pain vs due to GI issue.  Can use ibuprofen and start chest wall stretches.  Consider stress testing if recurs.Marland Kitchen given mild to moderate risk for CAD.   Essential hypertension Well controlled. Continue current medication. No SE. Continue.  Obesity, Class III, BMI 40-49.9 (morbid obesity) ([facility]) Encouraged exercise, weight loss, healthy eating habits.       Eliezer Lofts, MD

## 2019-03-08 NOTE — Assessment & Plan Note (Addendum)
EKG unremarkable. NSR, no significant ST changes, no Q.  Most likely stress, MSK pain vs due to GI issue.  Can use ibuprofen and start chest wall stretches.  Consider stress testing if recurs.Marland Kitchen given mild to moderate risk for CAD.

## 2019-03-08 NOTE — Patient Instructions (Signed)
Continue amlodipine. Work on [employer], start regular exercise. Call if chest pain is recurring. Increase  Water in diet.

## 2019-03-08 NOTE — Assessment & Plan Note (Signed)
Encouraged exercise, weight loss, healthy eating habits. ? ?

## 2019-03-08 NOTE — Assessment & Plan Note (Signed)
Well controlled. Continue current medication. No SE. Continue.

## 2020-02-27 ENCOUNTER — Encounter: Payer: Self-pay | Admitting: Family Medicine

## 2020-02-27 ENCOUNTER — Other Ambulatory Visit: Payer: Self-pay

## 2020-02-27 ENCOUNTER — Ambulatory Visit: Payer: BC Managed Care – PPO | Admitting: Family Medicine

## 2020-02-27 ENCOUNTER — Telehealth: Payer: Self-pay

## 2020-02-27 VITALS — BP 130/86 | HR 73 | Temp 97.4°F | Ht 69.0 in | Wt 299.2 lb

## 2020-02-27 DIAGNOSIS — R252 Cramp and spasm: Secondary | ICD-10-CM | POA: Insufficient documentation

## 2020-02-27 MED ORDER — CYCLOBENZAPRINE HCL 10 MG PO TABS: 5.0000 mg | ORAL_TABLET | Freq: Every evening | ORAL | 0 refills | Status: DC | PRN

## 2020-02-27 NOTE — Telephone Encounter (Signed)
[place] Night - Client TELEPHONE ADVICE RECORD [facility] Patient Name: [name] Gender: Female DOB: January 30, 1976 Age: 44 Y 10 M 14 D Return Phone Number: [phone] (Primary), [phone] (Secondary) Address[place] [address] Client [place] Night - Client Client Site [place] Physician Waunita Schooner- MD Contact Type Call Who Is Calling Patient / Member / Family / Caregiver Call Type Triage / Clinical Relationship To Patient Self Return Phone Number [phone] (Primary) Chief Complaint Abdominal Pain Reason for Call Symptomatic / Request for [place] states she is having muscle cramps in her abdomen, inner thighs, and calfs. She can hardly breath when they come. Translation No Nurse Assessment Nurse: Jac Canavan, RN, [place]ill Bamberg Date/Time (Eastern Time): 02/27/2020 12:16:42 AM Confirm and document reason for call. If symptomatic, describe symptoms. ---Caller states she is having muscle spasms all over her body. They last 10-15 minutes when they come on. They are severe and bringing her to tears. She has had them all over her body. This has been worsening over the past 6 months. Has the patient had close contact with a person known or suspected to have the novel coronavirus illness OR traveled / lives in area with major community spread (including international travel) in the last 14 days from the onset of symptoms? * If Asymptomatic, screen for exposure and travel within the last 14 days. ---No Does the patient have any new or worsening symptoms? ---Yes Will a triage be completed? ---Yes Related visit to physician within the last 2 weeks? ---No Does the PT have any chronic conditions? (i.e. diabetes, asthma, this includes High risk factors for pregnancy, etc.) ---Yes List chronic conditions. ---HTN Is the patient pregnant or possibly  pregnant? (Ask all females between the ages of 59-55) ---No Is this a behavioral health or substance abuse call? ---No Guidelines Guideline Title Affirmed Question Affirmed Notes Nurse Date/Time (Eastern Time) Muscle Jerks - Tics - Shudders [1] Muscle rigidity or tightness AND [2] brief (now gone) Jac Canavan, Therapist, sports, Estill Bamberg 02/27/2020 12:19:29 AM PLEASE NOTE: All timestamps contained within this report are represented as [place] Standard Time. CONFIDENTIALTY NOTICE: This fax transmission is intended only for the addressee. It contains information that is legally privileged, confidential or otherwise protected from use or disclosure. If you are not the intended recipient, you are strictly prohibited from reviewing, disclosing, copying using or disseminating any of this information or taking any action in reliance on or regarding this information. If you have received this fax in error, please notify us immediately by telephone so that we can arrange for its return to [place]. Phone: [phone], Toll-Free: [phone], Fax: [phone] Page: 2 of 2 Call Id: [id number] [place]. Time Eilene Ghazi Time) Disposition Final User 02/27/2020 12:22:31 AM See HCP within 4 Hours (or PCP triage) Yes Jac Canavan, RN, Shelly Coss Disagree/Comply Comply Caller Understands Yes PreDisposition [place] Advice Given Per Guideline SEE HCP WITHIN 4 HOURS (OR PCP TRIAGE): CALL BACK IF: * You become worse. Referrals [facility] - ED

## 2020-02-27 NOTE — Patient Instructions (Signed)
Please stop at the lab to have labs drawn. Can try muscle relaxant  If a home when severe cramping starts.

## 2020-02-27 NOTE — Telephone Encounter (Signed)
Noted  

## 2020-02-27 NOTE — Telephone Encounter (Signed)
I spoke with pt; pt went to sleep and did not go anywhere. Pt said her body is sore this morning. Pt said she has been having cramping in different areas of her body for 1 year but cramping pain is worsening. 2 nights ago was having abd cramping and last night inner thigh cramping on both legs; no swelling,redness, or warmth in legs and no cramping today. No fever or chills. Pt request appt to be [facility] at [facility]. Pt has no covid symptoms, no travel and no known exposure to + covid. Pt had covid vaccine and will bring documentation when she comes for appt. Pt scheduled appt today at 2:40 with Dr Diona Browner. UC & ED precautions given and pt voiced understanding.

## 2020-02-27 NOTE — Progress Notes (Signed)
Chief Complaint  Patient presents with  . Spasms    All Over Body    History of Present Illness: HPI  44 year old female pt of Dr. Verda Cumins presents with severe body cramps all over.   She reports she has had intermittent cramps in last  2 year. Occuring every few months.  Originally thought it was HCTZ... but it did not go away.  She is now having severe cramps in legs, inner thighs, stomach, chin, arms. Now more frequent every other day. They take her breath away.  Last 5-10 min.  No new meds. No physical activity changes.  she drinks a lot of water.  No meds to treat.   No family history.  Has tried mustard .. it has not helped.  This visit occurred during the SARS-CoV-2 public health emergency.  Safety protocols were in place, including screening questions prior to the visit, additional usage of staff PPE, and extensive cleaning of exam room while observing appropriate contact time as indicated for disinfecting solutions.   COVID 19 screen:  No recent travel or known exposure to COVID19 The patient denies respiratory symptoms of COVID 19 at this time. The importance of social distancing was discussed today.     Review of Systems  Constitutional: Negative for chills and fever.  HENT: Negative for congestion and ear pain.   Eyes: Negative for pain and redness.  Respiratory: Negative for cough and shortness of breath.   Cardiovascular: Negative for chest pain, palpitations and leg swelling.  Gastrointestinal: Negative for abdominal pain, blood in stool, constipation, diarrhea, nausea and vomiting.  Genitourinary: Negative for dysuria.  Musculoskeletal: Negative for falls and myalgias.  Skin: Negative for rash.  Neurological: Negative for dizziness.  Psychiatric/Behavioral: Negative for depression. The patient is not nervous/anxious.       Past Medical History:  Diagnosis Date  . Hypertension     reports that she has never smoked. She has never used smokeless  tobacco. She reports current alcohol use. She reports that she does not use drugs.   Current Outpatient Medications:  .  amLODipine (NORVASC) 5 MG tablet, TAKE 1 TABLET BY MOUTH EVERY DAY, Disp: 90 tablet, Rfl: 1 .  ibuprofen (ADVIL) 800 MG tablet, Take 1 tablet (800 mg total) by mouth every 8 (eight) hours as needed., Disp: 30 tablet, Rfl: 1 .  levonorgestrel (MIRENA) 20 MCG/24HR IUD, 1 each by Intrauterine route once., Disp: , Rfl:  .  zolpidem (AMBIEN CR) 6.25 MG CR tablet, Take 6.25 mg by mouth at bedtime as needed. , Disp: , Rfl:    Observations/Objective: Blood pressure (!) 130/86, pulse 73, temperature (!) 97.4 F (36.3 C), temperature source Temporal, height 5\' 9"  (1.753 m), weight (!) 299 lb 4 oz (135.7 kg), SpO2 98 %.  Physical Exam Constitutional:      General: She is not in acute distress.    Appearance: Normal appearance. She is well-developed. She is obese. She is not ill-appearing or toxic-appearing.  HENT:     Head: Normocephalic.     Right Ear: Hearing, tympanic membrane, ear canal and external ear normal. Tympanic membrane is not erythematous, retracted or bulging.     Left Ear: Hearing, tympanic membrane, ear canal and external ear normal. Tympanic membrane is not erythematous, retracted or bulging.     Nose: No mucosal edema or rhinorrhea.     Right Sinus: No maxillary sinus tenderness or frontal sinus tenderness.     Left Sinus: No maxillary sinus tenderness or frontal sinus  tenderness.     Mouth/Throat:     Pharynx: Uvula midline.  Eyes:     General: Lids are normal. Lids are everted, no foreign bodies appreciated.     Conjunctiva/sclera: Conjunctivae normal.     Pupils: Pupils are equal, round, and reactive to light.  Neck:     Thyroid: No thyroid mass or thyromegaly.     Vascular: No carotid bruit.     Trachea: Trachea normal.  Cardiovascular:     Rate and Rhythm: Normal rate and regular rhythm.     Pulses: Normal pulses.     Heart sounds: Normal heart  sounds, S1 normal and S2 normal. No murmur heard.  No friction rub. No gallop.   Pulmonary:     Effort: Pulmonary effort is normal. No tachypnea or respiratory distress.     Breath sounds: Normal breath sounds. No decreased breath sounds, wheezing, rhonchi or rales.  Abdominal:     General: Bowel sounds are normal.     Palpations: Abdomen is soft.     Tenderness: There is no abdominal tenderness.  Musculoskeletal:     Cervical back: Normal range of motion and neck supple.  Skin:    General: Skin is warm and dry.     Findings: No rash.  Neurological:     Mental Status: She is alert.  Psychiatric:        Mood and Affect: Mood is not anxious or depressed.        Speech: Speech normal.        Behavior: Behavior normal. Behavior is cooperative.        Thought Content: Thought content normal.        Judgment: Judgment normal.      Assessment and Plan   Muscle cramps Push fluids. Eval with labs. Can try muscle relaxant  If a home when severe cramping starts.     Eliezer Lofts, MD

## 2020-02-28 ENCOUNTER — Other Ambulatory Visit: Payer: Self-pay | Admitting: Family Medicine

## 2020-02-28 DIAGNOSIS — R748 Abnormal levels of other serum enzymes: Secondary | ICD-10-CM

## 2020-02-28 LAB — IBC + FERRITIN
Ferritin: 64.6 ng/mL (ref 10.0–291.0)
Iron: 74 ug/dL (ref 42–145)
Saturation Ratios: 18.4 % — ABNORMAL LOW (ref 20.0–50.0)
Transferrin: 287 mg/dL (ref 212.0–360.0)

## 2020-02-28 LAB — CBC WITH DIFFERENTIAL/PLATELET
Basophils Absolute: 0 10*3/uL (ref 0.0–0.1)
Basophils Relative: 0.9 % (ref 0.0–3.0)
Eosinophils Absolute: 0.1 10*3/uL (ref 0.0–0.7)
Eosinophils Relative: 3.4 % (ref 0.0–5.0)
HCT: 37.5 % (ref 36.0–46.0)
Hemoglobin: 12.4 g/dL (ref 12.0–15.0)
Lymphocytes Relative: 34.1 % (ref 12.0–46.0)
Lymphs Abs: 1.4 10*3/uL (ref 0.7–4.0)
MCHC: 33 g/dL (ref 30.0–36.0)
MCV: 83.5 fl (ref 78.0–100.0)
Monocytes Absolute: 0.5 10*3/uL (ref 0.1–1.0)
Monocytes Relative: 11.1 % (ref 3.0–12.0)
Neutro Abs: 2.1 10*3/uL (ref 1.4–7.7)
Neutrophils Relative %: 50.5 % (ref 43.0–77.0)
Platelets: 235 10*3/uL (ref 150.0–400.0)
RBC: 4.49 Mil/uL (ref 3.87–5.11)
RDW: 13.5 % (ref 11.5–15.5)
WBC: 4.2 10*3/uL (ref 4.0–10.5)

## 2020-02-28 LAB — T3, FREE: T3, Free: 3.3 pg/mL (ref 2.3–4.2)

## 2020-02-28 LAB — COMPREHENSIVE METABOLIC PANEL
ALT: 29 U/L (ref 0–35)
AST: 23 U/L (ref 0–37)
Albumin: 4.3 g/dL (ref 3.5–5.2)
Alkaline Phosphatase: 43 U/L (ref 39–117)
BUN: 12 mg/dL (ref 6–23)
CO2: 25 mEq/L (ref 19–32)
Calcium: 9.5 mg/dL (ref 8.4–10.5)
Chloride: 104 mEq/L (ref 96–112)
Creatinine, Ser: 1.03 mg/dL (ref 0.40–1.20)
GFR: 70.81 mL/min (ref >60.00)
Glucose, Bld: 92 mg/dL (ref 70–99)
Potassium: 4.4 mEq/L (ref 3.5–5.1)
Sodium: 135 mEq/L (ref 135–145)
Total Bilirubin: 0.6 mg/dL (ref 0.2–1.2)
Total Protein: 6.8 g/dL (ref 6.0–8.3)

## 2020-02-28 LAB — T4, FREE: Free T4: 0.84 ng/dL (ref 0.60–1.60)

## 2020-02-28 LAB — CK: Total CK: 287 U/L — ABNORMAL HIGH (ref 7–177)

## 2020-02-28 LAB — TSH: TSH: 2.55 u[IU]/mL (ref 0.35–4.50)

## 2020-03-14 ENCOUNTER — Other Ambulatory Visit: Payer: Self-pay | Admitting: Family Medicine

## 2020-03-16 NOTE — Telephone Encounter (Signed)
Last office visit 02/27/2020 for muscle cramps with Dr. Diona Browner.  Last refilled 02/27/2020 for #15 with no refills.  No future appointments.

## 2020-04-06 NOTE — Assessment & Plan Note (Signed)
Push fluids. Eval with labs. Can try muscle relaxant  If a home when severe cramping starts.

## 2020-05-24 ENCOUNTER — Other Ambulatory Visit: Payer: Self-pay | Admitting: Family Medicine

## 2020-05-24 DIAGNOSIS — I10 Essential (primary) hypertension: Secondary | ICD-10-CM

## 2020-06-14 ENCOUNTER — Other Ambulatory Visit: Payer: Self-pay

## 2020-06-15 MED ORDER — CYCLOBENZAPRINE HCL 10 MG PO TABS: 5.0000 mg | ORAL_TABLET | Freq: Every evening | ORAL | 0 refills | Status: DC | PRN

## 2020-06-15 NOTE — Telephone Encounter (Signed)
Pharmacy requests refill on: Cyclobenzaprine 10 mg  LAST REFILL: 03/14/2020 LAST OV: 02/27/2020 NEXT OV: Not Scheduled PHARMACY: [facility] [place], [place]

## 2020-10-06 ENCOUNTER — Telehealth: Payer: Self-pay | Admitting: Family Medicine

## 2020-10-06 DIAGNOSIS — I10 Essential (primary) hypertension: Secondary | ICD-10-CM

## 2020-10-07 MED ORDER — AMLODIPINE BESYLATE 5 MG PO TABS: 5.0000 mg | ORAL_TABLET | Freq: Every day | ORAL | 0 refills | Status: DC

## 2020-10-07 NOTE — Addendum Note (Signed)
Addended by: Tammi Sou on: 10/07/2020 09:12 AM   Modules accepted: Orders

## 2020-10-07 NOTE — Telephone Encounter (Signed)
Needed to get refill has an bp medication

## 2020-10-07 NOTE — Telephone Encounter (Signed)
Med refilled once, to give her enough to last until her appt

## 2020-10-14 ENCOUNTER — Encounter: Payer: Self-pay | Admitting: Family Medicine

## 2020-10-14 ENCOUNTER — Telehealth: Payer: Self-pay

## 2020-10-14 ENCOUNTER — Telehealth: Payer: Self-pay | Admitting: Family Medicine

## 2020-10-14 ENCOUNTER — Telehealth (INDEPENDENT_AMBULATORY_CARE_PROVIDER_SITE_OTHER): Payer: Self-pay | Admitting: Family Medicine

## 2020-10-14 VITALS — Ht 69.0 in

## 2020-10-14 DIAGNOSIS — R059 Cough, unspecified: Secondary | ICD-10-CM

## 2020-10-14 MED ORDER — BENZONATATE 100 MG PO CAPS: 100.0000 mg | ORAL_CAPSULE | Freq: Three times a day (TID) | ORAL | 0 refills | Status: DC | PRN

## 2020-10-14 MED ORDER — HYDROCODONE-HOMATROPINE 5-1.5 MG/5ML PO SYRP: 5.0000 mL | ORAL_SOLUTION | Freq: Four times a day (QID) | ORAL | 0 refills | Status: DC | PRN

## 2020-10-14 MED ORDER — CHERATUSSIN AC 100-10 MG/5ML PO SOLN: 5.0000 mL | Freq: Three times a day (TID) | ORAL | 0 refills | Status: DC | PRN

## 2020-10-14 NOTE — Telephone Encounter (Signed)
Pharmacy called to let us know that they don't have the cough syrup and they are unable to get it - they want to know if you can send something different?

## 2020-10-14 NOTE — Progress Notes (Signed)
Patient ID: [name], female   DOB: 18-Feb-1976, 45 y.o.   MRN: [id number]  This visit type was conducted due to national recommendations for restrictions regarding the COVID-19 pandemic in an effort to limit this patient's exposure and mitigate transmission in our community.   Virtual Visit via Telephone Note  I connected with [name] on 10/14/20 at 11:45 AM EDT by telephone and verified that I am speaking with the correct person using two identifiers.   I discussed the limitations, risks, security and privacy concerns of performing an evaluation and management service by telephone and the availability of in person appointments. I also discussed with the patient that there may be a patient responsible charge related to this service. The patient expressed understanding and agreed to proceed.  Location patient: home Location provider: work or home office Participants present for the call: patient, provider Patient did not have a visit in the prior 7 days to address this/these issue(s).   History of Present Illness:  [name] relates onset about 4 weeks ago dry cough.  Her cough has remained relatively dry during this time.  She works third shift and already has difficulty sleeping.  She has tried some Benadryl and over-the-counter cough medication without relief.  No fever.  No hemoptysis.  No appetite or weight changes.  No history of smoking.  No dyspnea.  No pleuritic pain.  She denies any postnasal drip symptoms.  No ACE inhibitor use.  No obvious GERD symptoms.  No wheezing or history of asthma.  Her cough has been very disruptive for sleep.  She has no known drug allergies.  Past Medical History:  Diagnosis Date  . Hypertension    Past Surgical History:  Procedure Laterality Date  . ABDOMINOPLASTY    . ACHILLES TENDON SURGERY  06/08/2012   Procedure: ACHILLES TENDON REPAIR;  Surgeon: Newt Minion, MD;  Location: [place];  Service: Orthopedics;  Laterality: Right;   Right Achilles Reconstruction  . APPENDECTOMY    . CHOLECYSTECTOMY    . HYSTEROSCOPY WITH D & C N/A 03/06/2014   Procedure: DILATATION AND CURETTAGE With IUD Removal;  Surgeon: Marvene Staff, MD;  Location: [facility];  Service: Gynecology;  Laterality: N/A;  . KNEE SURGERY     right knee arthroscropic    reports that she has never smoked. She has never used smokeless tobacco. She reports current alcohol use. She reports that she does not use drugs. family history includes Diabetes in her father and mother; Hypertension in her father and mother. No Known Allergies    Observations/Objective: Patient sounds cheerful and well on the phone. I do not appreciate any SOB. Speech and thought processing are grossly intact. Patient reported vitals:  Assessment and Plan:  4-week history of dry cough.  Differential is post viral bronchitis versus other.  She is in no respiratory distress.  No obvious GERD symptoms but we did mention other potential etiologies for persistent cough including silent GERD, postnasal drip, unrecognized asthma and none of these seem extremely likely.  -Tessalon Perles 100 mg every 8 hours needed for cough -Wrote for limited Hycodan cough syrup 1 teaspoon nightly if cough not relieved with Tessalon -PA and lateral chest x-ray.  She states she needs this yearly anyway for work because of borderline positive PPD reaction years ago.  Never treated for latent TB.  She denies any red flag symptoms such as fever, weight loss, hemoptysis  Follow Up Instructions:  -As above   99441 5-10 99442 11-20  99443 21-30  I did not refer this patient for an OV in the next 24 hours for this/these issue(s).  I discussed the assessment and treatment plan with the patient. The patient was provided an opportunity to ask questions and all were answered. The patient agreed with the plan and demonstrated an understanding of the instructions.   The patient was advised to call back or seek  an in-person evaluation if the symptoms worsen or if the condition fails to improve as anticipated.  I provided 21 minutes of non-face-to-face time during this encounter.   Carolann Littler, MD

## 2020-10-14 NOTE — Telephone Encounter (Signed)
Patient is aware 

## 2020-10-14 NOTE — Addendum Note (Signed)
Addended by: Eulas Post on: 10/14/2020 01:05 PM   Modules accepted: Orders

## 2020-10-14 NOTE — Telephone Encounter (Signed)
I sent in some Cheratussin AC cough syrup to use in place of the Hycodan.  Hopefully they will have that.

## 2020-10-16 ENCOUNTER — Ambulatory Visit (INDEPENDENT_AMBULATORY_CARE_PROVIDER_SITE_OTHER)
Admission: RE | Admit: 2020-10-16 | Discharge: 2020-10-16 | Disposition: A | Discharge status: Home / Self Care | Payer: Self-pay | Source: Ambulatory Visit | Attending: Family Medicine | Admitting: Family Medicine

## 2020-10-16 ENCOUNTER — Other Ambulatory Visit: Payer: Self-pay

## 2020-10-16 DIAGNOSIS — R059 Cough, unspecified: Secondary | ICD-10-CM

## 2020-10-27 ENCOUNTER — Encounter: Payer: BC Managed Care – PPO | Admitting: Family Medicine

## 2020-10-27 NOTE — Telephone Encounter (Signed)
error 

## 2020-10-29 ENCOUNTER — Other Ambulatory Visit: Payer: Self-pay | Admitting: Family Medicine

## 2020-10-29 DIAGNOSIS — I10 Essential (primary) hypertension: Secondary | ICD-10-CM

## 2020-11-04 ENCOUNTER — Encounter: Payer: Self-pay | Admitting: Family Medicine

## 2020-11-08 ENCOUNTER — Emergency Department: Payer: PRIVATE HEALTH INSURANCE

## 2020-11-08 ENCOUNTER — Emergency Department
Admission: EM | Admit: 2020-11-08 | Discharge: 2020-11-08 | Disposition: A | Discharge status: Home / Self Care | Payer: PRIVATE HEALTH INSURANCE | Attending: Emergency Medicine | Admitting: Emergency Medicine

## 2020-11-08 DIAGNOSIS — Y990 Civilian activity done for income or pay: Secondary | ICD-10-CM | POA: Diagnosis not present

## 2020-11-08 DIAGNOSIS — I10 Essential (primary) hypertension: Secondary | ICD-10-CM | POA: Insufficient documentation

## 2020-11-08 DIAGNOSIS — W228XXA Striking against or struck by other objects, initial encounter: Secondary | ICD-10-CM | POA: Insufficient documentation

## 2020-11-08 DIAGNOSIS — Z79899 Other long term (current) drug therapy: Secondary | ICD-10-CM | POA: Insufficient documentation

## 2020-11-08 DIAGNOSIS — S63501A Unspecified sprain of right wrist, initial encounter: Secondary | ICD-10-CM | POA: Diagnosis not present

## 2020-11-08 DIAGNOSIS — S6991XA Unspecified injury of right wrist, hand and finger(s), initial encounter: Secondary | ICD-10-CM | POA: Diagnosis present

## 2020-11-08 DIAGNOSIS — Y9389 Activity, other specified: Secondary | ICD-10-CM | POA: Insufficient documentation

## 2020-11-08 MED ORDER — IBUPROFEN 600 MG PO TABS: 600.0000 mg | ORAL_TABLET | Freq: Four times a day (QID) | ORAL | 0 refills | Status: DC | PRN

## 2020-11-08 NOTE — ED Provider Notes (Signed)
[facility] Emergency Department Provider Note   ____________________________________________   Event Date/Time   First MD Initiated Contact with Patient 11/08/20 [phone]     (approximate)  I have reviewed the triage vital signs and the nursing notes.   HISTORY  Chief Complaint Wrist Pain   HPI [name] is a 45 y.o. female sent to the ED with complaint of right wrist pain.  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  Patient reached out to grab the pole to prevent it from hitting the patient.  Patient has continued to have wrist pain since that time despite ice packs and over-the-counter medication.  She also used an Ace wrap until she was able to finish her shift to have it evaluated.       Past Medical History:  Diagnosis Date  . Hypertension     Patient Active Problem List   Diagnosis Date Noted  . Muscle cramps 02/27/2020  . Atypical chest pain 03/08/2019  . Essential hypertension 08/14/2018  . 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 Obesity, Class III, BMI 40-49.9 (morbid obesity) ([place]) 08/14/2018    Past Surgical History:  Procedure Laterality Date  . ABDOMINOPLASTY    . ACHILLES TENDON SURGERY  06/08/2012   Procedure: ACHILLES TENDON REPAIR;  Surgeon: Newt Minion, MD;  Location: [place];  Service: Orthopedics;  Laterality: Right;  Right Achilles Reconstruction  . APPENDECTOMY    . CHOLECYSTECTOMY    . HYSTEROSCOPY WITH D & C N/A 03/06/2014   Procedure: DILATATION AND CURETTAGE With IUD Removal;  Surgeon: Marvene Staff, MD;  Location: [facility];  Service: Gynecology;  Laterality: N/A;  . KNEE SURGERY     right knee arthroscropic    Prior to Admission medications   Medication Sig Start Date End Date Taking? Authorizing Provider  ibuprofen (ADVIL) 600 MG tablet Take 1 tablet (600 mg total) by mouth every 6 (six) hours as needed. 11/08/20  Yes Letitia Neri L, PA-C  amLODipine (NORVASC) 5 MG tablet TAKE 1 TABLET (5 MG TOTAL) BY MOUTH DAILY. 10/29/20   Lesleigh Noe, MD  benzonatate (TESSALON PERLES) 100 MG capsule Take 1 capsule (100 mg total) by mouth 3 (three) times daily as needed for cough. 10/14/20   Burchette, Alinda Sierras, MD  cyclobenzaprine (FLEXERIL) 10 MG tablet Take 0.5-1 tablets (5-10 mg total) by mouth at bedtime as needed for muscle spasms. 06/15/20   Lesleigh Noe, MD  guaiFENesin-codeine (CHERATUSSIN AC) 100-10 MG/5ML syrup Take 5 mLs by mouth 3 (three) times daily as needed for cough. 10/14/20   Burchette, Alinda Sierras, MD  levonorgestrel (MIRENA) 20 MCG/24HR IUD 1 each by Intrauterine route once.    [provider]  zolpidem (AMBIEN CR) 6.25 MG CR tablet Take 6.25 mg by mouth at bedtime as needed.  08/04/18   [provider]    Allergies Patient has no known allergies.  Family History  Problem Relation Age of Onset  . Diabetes Mother   . Hypertension Mother   . Diabetes Father   . Hypertension Father     Social History Social History   Tobacco Use  . Smoking status: Never Smoker  . Smokeless tobacco: Never Used  Vaping Use  . Vaping Use: Never used  Substance Use Topics  . Alcohol use: Yes    Comment: once a month, 1-2 servings  . Drug use: No  Review of Systems Constitutional: No fever/chills Cardiovascular: Denies chest pain. Respiratory: Denies shortness of breath. Gastrointestinal: No abdominal pain.  No nausea, no vomiting.  Musculoskeletal: Positive for right wrist pain. Skin: Negative for rash. Neurological: Negative for focal weakness or numbness.  ____________________________________________   PHYSICAL EXAM:  VITAL SIGNS: ED Triage Vitals  Enc Vitals Group     BP      Pulse      Resp      Temp      Temp src      SpO2      Weight      Height      Head Circumference      Peak Flow      Pain Score      Pain Loc      Pain Edu?      Excl. in [place]?     Constitutional:  Alert and oriented. Well appearing and in no acute distress. Eyes: Conjunctivae are normal.  Head: Atraumatic. Neck: No stridor.   Cardiovascular: Normal rate, regular rhythm. Grossly normal heart sounds.  Good peripheral circulation. Respiratory: Normal respiratory effort.  No retractions. Lungs CTAB. Musculoskeletal: Examination of the right wrist and hand there is no gross deformity however there is moderate tenderness on palpation of the carpal bones and soft tissue.  Patient is able to flex and extend at the wrist but increases her pain to do so.  Good muscle strength.  Pulse is present.  Skin intact and no discoloration noted.  Capillary refills less than 3 seconds. Neurologic:  Normal speech and language. No gross focal neurologic deficits are appreciated.  Skin:  Skin is warm, dry and intact. No rash noted. Psychiatric: Mood and affect are normal. Speech and behavior are normal.  ____________________________________________   LABS (all labs ordered are listed, but only abnormal results are displayed)  Labs Reviewed - No data to display ____________________________________________  RADIOLOGY I, Johnn Hai, personally viewed and evaluated these images (plain radiographs) as part of my medical decision making, as well as reviewing the written report by the radiologist.   Official radiology report(s): DG Wrist Complete Right  Result Date: 11/08/2020 CLINICAL DATA:  Pain following injury EXAM: RIGHT WRIST - COMPLETE 3+ VIEW COMPARISON:  None. FINDINGS: Frontal, oblique, lateral, and ulnar deviation scaphoid images obtained. No fracture or dislocation. Joint spaces appear normal. No erosive change. IMPRESSION: No fracture or dislocation.  No evident arthropathy. Electronically Signed   By: Lowella Grip III M.D.   On: 11/08/2020 08:54    ____________________________________________   PROCEDURES  Procedure(s) performed (including Critical  Care):  Procedures   ____________________________________________   INITIAL IMPRESSION / ASSESSMENT AND PLAN / ED COURSE  As part of my medical decision making, I reviewed the following data within the electronic MEDICAL RECORD NUMBER Notes from prior ED visits and [place] Controlled Substance Database  45 year old female presents to the ED with complaint of right wrist pain after an injury that occurred while working at [employer] on third shift.  Patient states she grabbed for an IV pole to prevent it from hitting another patient when it tilted over.  Physical exam is consistent with a sprained right wrist.  X-rays were negative for any acute bony injury.  Patient was placed in a cock-up wrist splint and a prescription for ibuprofen 600 mg every 6 hours with food was written.  Patient was placed on restricted duty limiting the use of her right hand.  She is to follow-up with Workmen's Comp.  clinic either here at [facility] or where the hospital would like for her to be reevaluated before returning to full duty.  ____________________________________________   FINAL CLINICAL IMPRESSION(S) / ED DIAGNOSES  Final diagnoses:  Sprain of right wrist, initial encounter     ED Discharge Orders         Ordered    ibuprofen (ADVIL) 600 MG tablet  Every 6 hours PRN        11/08/20 0914          *Please note:  ROXAN YAMAMOTO was evaluated in Emergency Department on 11/08/2020 for the symptoms described in the history of present illness. She was evaluated in the context of the global COVID-19 pandemic, which necessitated consideration that the patient might be at risk for infection with the SARS-CoV-2 virus that causes COVID-19. Institutional protocols and algorithms that pertain to the evaluation of patients at risk for COVID-19 are in a state of rapid change based on information released by regulatory bodies including the CDC and federal and state organizations. These policies and algorithms were followed during  the patient's care in the ED.  Some ED evaluations and interventions may be delayed as a result of limited staffing during and the pandemic.*   Note:  This document was prepared using Dragon voice recognition software and may include unintentional dictation errors.    Johnn Hai, PA-C 11/08/20 4650    Arta Silence, MD 11/08/20 1501

## 2020-11-08 NOTE — Discharge Instructions (Signed)
Follow-up with your supervisor for work limitations.  The paper printed from [facility] was filled out.  This restricts you until you are evaluated by the Workmen's Comp. either here at [facility] or [employer]. 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  Wear wrist splint for protection and support.  Ibuprofen 600 mg 3 times daily with food.  Ice and elevation to help with pain and swelling.  If your supervisor deems that there is no work that she can do they will be responsible for sending you home.

## 2020-11-08 NOTE — ED Triage Notes (Signed)
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 Pt grabbed IV pole to prevent it hitting other pt and injured right wrist. Ace wrap intact, ice applied per pt and swelling noted prior to ace bandage being applied. Spoke with AC, no drug test needed for worker's comp.

## 2020-12-04 ENCOUNTER — Other Ambulatory Visit: Payer: Self-pay | Admitting: Family Medicine

## 2020-12-04 DIAGNOSIS — I10 Essential (primary) hypertension: Secondary | ICD-10-CM

## 2020-12-04 NOTE — Telephone Encounter (Signed)
Pt needs to come in for an appt for further refills. She no showed her last appt.

## 2020-12-09 NOTE — Telephone Encounter (Signed)
Attempted to reach patient to schedule. No answer and voicemail is full. 

## 2021-01-12 NOTE — Telephone Encounter (Signed)
Attempted to contact patient, was not successful. LVM to call back.

## 2021-01-26 ENCOUNTER — Other Ambulatory Visit: Payer: Self-pay | Admitting: Family Medicine

## 2021-01-26 ENCOUNTER — Other Ambulatory Visit: Payer: Self-pay | Admitting: Emergency Medicine

## 2021-01-26 DIAGNOSIS — I10 Essential (primary) hypertension: Secondary | ICD-10-CM

## 2021-01-27 NOTE — Telephone Encounter (Signed)
Looks like patient has an appointment scheduled with Dr. Einar Pheasant for later in July, she has not been seen since May 2020 by Dr. Einar Pheasant.  We received a refill request for her amlodipine, a 30-day supply was provided on 10/29/2020, has she been out this entire time?  Did she have a backlog of amlodipine at home?

## 2021-01-29 NOTE — Telephone Encounter (Signed)
Mychart message sent to pt, asking questions from [place].

## 2021-02-15 MED ORDER — AMLODIPINE BESYLATE 5 MG PO TABS
5.0000 mg | ORAL_TABLET | Freq: Every day | ORAL | 0 refills | Status: DC
Start: 2021-02-15 — End: 2021-03-09

## 2021-02-15 NOTE — Telephone Encounter (Signed)
Will fill amlodipine but will discuss flexeril at our visit on 7/21

## 2021-02-15 NOTE — Telephone Encounter (Signed)
Per mychart patient's response she has been out of Amlodipine. OK to refill? Next appointment on 02/18/21

## 2021-02-17 ENCOUNTER — Other Ambulatory Visit: Payer: Self-pay | Admitting: Family Medicine

## 2021-02-17 NOTE — Telephone Encounter (Signed)
Last office visit 02/27/2020 with Dr. Diona Browner for muscle cramps.  Ibuprofen 600 mg is on current medication list filled by Letitia Neri, PA-C for #30 with no refill.  Refill request is for 800 mg.  CPE scheduled with Dr. Einar Pheasant tomorrow 02/18/21.

## 2021-02-18 ENCOUNTER — Other Ambulatory Visit: Payer: Self-pay

## 2021-02-18 ENCOUNTER — Encounter: Payer: Self-pay | Admitting: Family Medicine

## 2021-02-18 ENCOUNTER — Ambulatory Visit (INDEPENDENT_AMBULATORY_CARE_PROVIDER_SITE_OTHER): Payer: BC Managed Care – PPO | Admitting: Family Medicine

## 2021-02-18 VITALS — BP 130/100 | HR 65 | Temp 98.1°F | Ht 68.75 in | Wt 308.2 lb

## 2021-02-18 DIAGNOSIS — T753XXA Motion sickness, initial encounter: Secondary | ICD-10-CM

## 2021-02-18 DIAGNOSIS — R252 Cramp and spasm: Secondary | ICD-10-CM

## 2021-02-18 DIAGNOSIS — Z0000 Encounter for general adult medical examination without abnormal findings: Secondary | ICD-10-CM | POA: Diagnosis not present

## 2021-02-18 DIAGNOSIS — G4726 Circadian rhythm sleep disorder, shift work type: Secondary | ICD-10-CM

## 2021-02-18 DIAGNOSIS — Z114 Encounter for screening for human immunodeficiency virus [HIV]: Secondary | ICD-10-CM

## 2021-02-18 DIAGNOSIS — Z1159 Encounter for screening for other viral diseases: Secondary | ICD-10-CM

## 2021-02-18 DIAGNOSIS — I10 Essential (primary) hypertension: Secondary | ICD-10-CM | POA: Diagnosis not present

## 2021-02-18 MED ORDER — SCOPOLAMINE 1 MG/3DAYS TD PT72: 1.0000 | MEDICATED_PATCH | TRANSDERMAL | 0 refills | Status: DC

## 2021-02-18 MED ORDER — IBUPROFEN 800 MG PO TABS
800.0000 mg | ORAL_TABLET | Freq: Three times a day (TID) | ORAL | 0 refills | Status: DC | PRN
Start: 2021-02-18 — End: 2021-09-04

## 2021-02-18 MED ORDER — CYCLOBENZAPRINE HCL 10 MG PO TABS: 5.0000 mg | ORAL_TABLET | Freq: Every evening | ORAL | 0 refills | Status: DC | PRN

## 2021-02-18 MED ORDER — ZOLPIDEM TARTRATE ER 6.25 MG PO TBCR: 6.2500 mg | EXTENDED_RELEASE_TABLET | Freq: Every evening | ORAL | 0 refills | Status: DC | PRN

## 2021-02-18 NOTE — Assessment & Plan Note (Signed)
Elevated. Restart amlodipine. Call next week with BP and anticipate increase. Return 1-2 months for bp check

## 2021-02-18 NOTE — Progress Notes (Signed)
Annual Exam   Chief Complaint:  Chief Complaint  Patient presents with   Annual Exam    History of Present Illness:  Ms. [name] is a 45 y.o. No obstetric history on file. who LMP was No LMP recorded. (Menstrual status: IUD)., presents today for her annual examination.    #HTN - restarted medication - amlodipine 5 mg a few days ago - has been high at home  Nutrition Diet: not good - working 3rd shift Exercise: not currently She does not get adequate calcium and Vitamin D in her diet.   Social History   Tobacco Use  Smoking Status Never  Smokeless Tobacco Never   Social History   Substance and Sexual Activity  Alcohol Use Yes   Comment: once a month, 1-2 servings   Social History   Substance and Sexual Activity  Drug Use No    Safety The patient wears seatbelts: yes.     The patient feels safe at home and in their relationships: yes.  General Health Dentist in the last year: No Eye doctor: not applicable  Menstrual No concerns  GYN She is single partner, contraception - IUD.    Cervical Cancer Screening:   Last Pap:   October 2019 Results were: no abnormalities /neg HPV DNA    Breast Cancer Screening There is FH of breast cancer. There is no FH of ovarian cancer. BRCA screening Not Indicated.  Discussed that for average risk women between age 51-49 screening may reduce the risk of breast cancer death, however, at a lower rate than those over age 35. And that the the false-positive rates resulting in unnecessary biopsies with more screening is higher. The balance of benefits vs harms likely improves as you progress through your 40s. The patient does want a mammogram this year.     Weight Wt Readings from Last 3 Encounters:  02/18/21 (!) 308 lb 4 oz (139.8 kg)  02/27/20 (!) 299 lb 4 oz (135.7 kg)  03/08/19 276 lb 12 oz (125.5 kg)   Patient has very high BMI  BMI Readings from Last 1 Encounters:  02/18/21 45.85 kg/m     Chronic  disease screening Blood pressure monitoring:  BP Readings from Last 3 Encounters:  02/18/21 (!) 130/100  11/08/20 (!) 135/96  02/27/20 (!) 130/86    Lipid Monitoring: Indication for screening: age >68, obesity, diabetes, family hx, CV risk factors.  Lipid screening: Yes  Lab Results  Component Value Date   CHOL 191 05/14/2018   HDL 40 05/14/2018   LDLCALC 129 05/14/2018   TRIG 110 05/14/2018     Diabetes Screening: age >45, overweight, family hx, PCOS, hx of gestational diabetes, at risk ethnicity Diabetes Screening screening: Yes  Lab Results  Component Value Date   HGBA1C 5.6 05/14/2018     Past Medical History:  Diagnosis Date   Hypertension     Past Surgical History:  Procedure Laterality Date   ABDOMINOPLASTY     ACHILLES TENDON SURGERY  06/08/2012   Procedure: ACHILLES TENDON REPAIR;  Surgeon: Newt Minion, MD;  Location: [facility];  Service: Orthopedics;  Laterality: Right;  Right Achilles Reconstruction   APPENDECTOMY     CHOLECYSTECTOMY     HYSTEROSCOPY WITH D & C N/A 03/06/2014   Procedure: DILATATION AND CURETTAGE With IUD Removal;  Surgeon: Marvene Staff, MD;  Location: [facility];  Service: Gynecology;  Laterality: N/A;   KNEE SURGERY     right knee arthroscropic    Prior to  Admission medications   Medication Sig Start Date End Date Taking? Authorizing Provider  amLODipine (NORVASC) 5 MG tablet Take 1 tablet (5 mg total) by mouth daily. 02/15/21  Yes Lesleigh Noe, MD  cyclobenzaprine (FLEXERIL) 10 MG tablet Take 0.5-1 tablets (5-10 mg total) by mouth at bedtime as needed for muscle spasms. 06/15/20  Yes Lesleigh Noe, MD  ibuprofen (ADVIL) 800 MG tablet Take 800 mg by mouth every 8 (eight) hours as needed.   Yes [provider]  levonorgestrel (MIRENA) 20 MCG/24HR IUD 1 each by Intrauterine route once.   Yes [provider]  zolpidem (AMBIEN CR) 6.25 MG CR tablet Take 6.25 mg by mouth at bedtime as needed.  08/04/18  Yes [provider]    No Known Allergies  Gynecologic History: No LMP recorded. (Menstrual status: IUD).  Obstetric History: No obstetric history on file.  Social History   Socioeconomic History   Marital status: Married    Spouse name: Eddie Dibbles   Number of children: 3   Years of education: Nursing school   Highest education level: Not on file  Occupational History   Not on file  Tobacco Use   Smoking status: Never   Smokeless tobacco: Never  Vaping Use   Vaping Use: Never used  Substance and Sexual Activity   Alcohol use: Yes    Comment: once a month, 1-2 servings   Drug use: No   Sexual activity: Yes    Birth control/protection: I.U.D.  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 [employer]   3 kids - ages 14 - 27   Husband - Eddie Dibbles   Enjoys: watching her kids play sports, spending time with husband   Exercise: not regular now - hoping to get back to it   Diet: not get currently, but hoping to do better   Social Determinants of Radio broadcast assistant Strain: Not on file  Food Insecurity: Not on file  Transportation Needs: Not on file  Physical Activity: Not on file  Stress: Not on file  Social Connections: Not on file  Intimate Partner Violence: Not on file    Family History  Problem Relation Age of Onset   Diabetes Mother    Hypertension Mother    Diabetes Father    Hypertension Father     Review of Systems  Constitutional:  Positive for malaise/fatigue. Negative for chills and fever.  HENT:  Negative for congestion and sore throat.   Eyes:  Negative for blurred vision and double vision.  Respiratory:  Negative for shortness of breath.   Cardiovascular:  Negative for chest pain.  Gastrointestinal:  Negative for heartburn, nausea and vomiting.  Genitourinary: Negative.   Musculoskeletal: Negative.  Negative for myalgias.  Skin:  Negative for rash.   Neurological:  Negative for dizziness and headaches.  Endo/Heme/Allergies:  Does not bruise/bleed easily.  Psychiatric/Behavioral:  Negative for depression. The patient has insomnia. The patient is not nervous/anxious.     Physical Exam BP (!) 130/100   Pulse 65   Temp 98.1 F (36.7 C) (Temporal)   Ht 5' 8.75" (1.746 m)   Wt (!) 308 lb 4 oz (139.8 kg)   SpO2 98%   BMI 45.85 kg/m    BP Readings from Last 3 Encounters:  02/18/21 (!) 130/100  11/08/20 (!) 135/96  02/27/20 (!) 130/86  Physical Exam Constitutional:      General: She is not in acute distress.    Appearance: She is well-developed. She is not diaphoretic.  HENT:     Head: Normocephalic and atraumatic.     Right Ear: External ear normal.     Left Ear: External ear normal.     Nose: Nose normal.  Eyes:     General: No scleral icterus.    Extraocular Movements: Extraocular movements intact.     Conjunctiva/sclera: Conjunctivae normal.  Cardiovascular:     Rate and Rhythm: Normal rate and regular rhythm.     Heart sounds: No murmur heard. Pulmonary:     Effort: Pulmonary effort is normal. No respiratory distress.     Breath sounds: Normal breath sounds. No wheezing.  Abdominal:     General: Bowel sounds are normal. There is no distension.     Palpations: Abdomen is soft. There is no mass.     Tenderness: There is no abdominal tenderness. There is no guarding or rebound.  Musculoskeletal:        General: Normal range of motion.     Cervical back: Neck supple.  Lymphadenopathy:     Cervical: No cervical adenopathy.  Skin:    General: Skin is warm and dry.     Capillary Refill: Capillary refill takes less than 2 seconds.  Neurological:     Mental Status: She is alert and oriented to person, place, and time.     Deep Tendon Reflexes: Reflexes normal.  Psychiatric:        Mood and Affect: Mood normal.        Behavior: Behavior normal.     Results:  PHQ-9:  [facility] Office Visit from  02/18/2021 in [place] at [place]  PHQ-9 Total Score 12         Assessment: 45 y.o. No obstetric history on file. female here for routine annual physical examination.  Plan: Problem List Items Addressed This Visit       Cardiovascular and Mediastinum   Essential hypertension    Elevated. Restart amlodipine. Call next week with BP and anticipate increase. Return 1-2 months for bp check         Other   Shift work sleep disorder    Trouble sleeping during the day and at night between shifts. Sleeping 4-5 hours. Will restart ambien 6.25 mg. F/u in 6 weeks       Relevant Medications   zolpidem (AMBIEN CR) 6.25 MG CR tablet   Obesity, Class III, BMI 40-49.9 (morbid obesity) ([place])    Encouraged healthy eating. Consider wegovy for next visit.        Relevant Medications   cyclobenzaprine (FLEXERIL) 10 MG tablet   ibuprofen (ADVIL) 800 MG tablet   Other Relevant Orders   Comprehensive metabolic panel   Lipid panel   Muscle cramps   Other Visit Diagnoses     Annual physical exam    -  Primary   Need for hepatitis C screening test       Relevant Orders   Hepatitis C antibody   Encounter for screening for HIV       Relevant Orders   HIV Antibody (routine testing w rflx)   Sea sickness, initial encounter       Relevant Medications   scopolamine (TRANSDERM-SCOP, 1.5 MG,) 1 MG/3DAYS       Screening: -- Blood pressure screen  see plan -- cholesterol screening: will obtain -- Weight screening: obese: discussed  management options, including lifestyle, dietary, and exercise. -- Diabetes Screening: will obtain -- Nutrition: Encouraged healthy diet  The 10-year ASCVD risk score Mikey Bussing DC Jr., et al., 2013) is: 3.9%*   Values used to calculate the score:     Age: 36 years     Sex: Female     Is Non-Hispanic African American: Yes     Diabetic: No     Tobacco smoker: No     Systolic Blood Pressure: 696 mmHg     Is BP treated: Yes     HDL Cholesterol: 40  mg/dL*     Total Cholesterol: 191 mg/dL*     * - Cholesterol units were assumed for this score calculation  -- Statin therapy for Age 57-75 with CVD risk >7.5%  Psych -- Depression screening (PHQ-9):  [facility] Office Visit from 02/18/2021 in [place] at [place]  PHQ-9 Total Score 12        Safety -- tobacco screening: not using -- alcohol screening:  low-risk usage. -- no evidence of domestic violence or intimate partner violence.   Cancer Screening -- pap smear not collected per ASCCP guidelines -- family history of breast cancer screening: done. not at high risk. -- Mammogram -  she will get with GYN -- Colon cancer (age 47+)-- not indicated  Immunizations Immunization History  Administered Date(s) Administered   DTaP 07/12/1977, 10/10/1977, 12/15/1977, 12/06/1978, 04/02/1982   Hepatitis A 05/13/2014, 06/13/2014   Hepatitis A, Ped/Adol-2 Dose 01/19/2015   Hepatitis B 05/13/2014, 06/13/2014, 01/19/2015   IPV 07/12/1977, 10/10/1977, 12/15/1977, 04/02/1982   MMR 07/17/1978, 12/06/1978   PFIZER(Purple Top)SARS-COV-2 Vaccination 09/24/2019, 10/14/2019, 06/10/2020   Td 03/07/1995   Tdap 06/24/2012   Varicella 05/13/2014, 06/13/2014    -- flu vaccine not in season -- TDAP q10 years up to date -- Covid-19 Vaccine up to date   Encouraged healthy diet and exercise. Encouraged regular vision and dental care.   Lesleigh Noe, MD

## 2021-02-18 NOTE — Patient Instructions (Addendum)
Wegovy or Saxenda or Ozempic for Morbid Obesity  - if covered and interested we can discuss at your next visit    Message next week with blood pressure monitoring - will likely increase amlodipine  Your blood pressure high.   High blood pressure increases your risk for heart attack and stroke.    Please check your blood pressure 2-4 times a week.   To check your blood pressure 1) Sit in a quiet and relaxed place for 5 minutes 2) Make sure your feet are flat on the ground 3) Consider checking first thing in the morning   Normal blood pressure is less than 140/90 Ideally you blood pressure should be around 120/80  Other ways you can reduce your blood pressure:  1) Regular exercise -- Try to get 150 minutes (30 minutes, 5 days a week) of moderate to vigorous aerobic excercise -- Examples: brisk walking (2.5 miles per hour), water aerobics, dancing, gardening, tennis, biking slower than 10 miles per hour 2) DASH Diet - low fat meats, more fresh fruits and vegetables, whole grains, low salt 3) Quit smoking if you smoke 4) Loose 5-10% of your body weight

## 2021-02-18 NOTE — Assessment & Plan Note (Signed)
Trouble sleeping during the day and at night between shifts. Sleeping 4-5 hours. Will restart ambien 6.25 mg. F/u in 6 weeks

## 2021-02-18 NOTE — Assessment & Plan Note (Signed)
Encouraged healthy eating. Consider wegovy for next visit.

## 2021-02-19 LAB — COMPREHENSIVE METABOLIC PANEL
ALT: 32 U/L (ref 0–35)
AST: 22 U/L (ref 0–37)
Albumin: 4.3 g/dL (ref 3.5–5.2)
Alkaline Phosphatase: 44 U/L (ref 39–117)
BUN: 11 mg/dL (ref 6–23)
CO2: 25 mEq/L (ref 19–32)
Calcium: 9.5 mg/dL (ref 8.4–10.5)
Chloride: 104 mEq/L (ref 96–112)
Creatinine, Ser: 0.97 mg/dL (ref 0.40–1.20)
GFR: 71.4 mL/min (ref >60.00)
Glucose, Bld: 96 mg/dL (ref 70–99)
Potassium: 4.6 mEq/L (ref 3.5–5.1)
Sodium: 137 mEq/L (ref 135–145)
Total Bilirubin: 0.6 mg/dL (ref 0.2–1.2)
Total Protein: 6.9 g/dL (ref 6.0–8.3)

## 2021-02-19 LAB — LIPID PANEL
Cholesterol: 192 mg/dL (ref 0–200)
HDL: 38.7 mg/dL — ABNORMAL LOW (ref >39.00)
LDL Cholesterol: 115 mg/dL — ABNORMAL HIGH (ref 0–99)
NonHDL: 153.18
Total CHOL/HDL Ratio: 5
Triglycerides: 192 mg/dL — ABNORMAL HIGH (ref 0.0–149.0)
VLDL: 38.4 mg/dL (ref 0.0–40.0)

## 2021-02-23 LAB — HEPATITIS C ANTIBODY
Hepatitis C Ab: REACTIVE — AB
SIGNAL TO CUT-OFF: 3 — ABNORMAL HIGH (ref <1.00)

## 2021-02-23 LAB — HIV ANTIBODY (ROUTINE TESTING W REFLEX): HIV 1&2 Ab, 4th Generation: NONREACTIVE

## 2021-02-23 LAB — HCV RNA,QUANTITATIVE REAL TIME PCR
HCV Quantitative Log: <1.18 Log IU/mL
HCV RNA, PCR, QN: <15 IU/mL

## 2021-02-27 ENCOUNTER — Encounter: Payer: Self-pay | Admitting: Family Medicine

## 2021-03-01 ENCOUNTER — Encounter: Payer: Self-pay | Admitting: Family Medicine

## 2021-03-09 ENCOUNTER — Other Ambulatory Visit: Payer: Self-pay | Admitting: Family Medicine

## 2021-03-09 DIAGNOSIS — I10 Essential (primary) hypertension: Secondary | ICD-10-CM

## 2021-03-12 ENCOUNTER — Encounter: Payer: Self-pay | Admitting: Family Medicine

## 2021-03-12 DIAGNOSIS — I10 Essential (primary) hypertension: Secondary | ICD-10-CM

## 2021-03-12 MED ORDER — AMLODIPINE BESYLATE 10 MG PO TABS: 10.0000 mg | ORAL_TABLET | Freq: Every day | ORAL | 3 refills | Status: DC

## 2021-03-12 MED ORDER — HYDROCHLOROTHIAZIDE 25 MG PO TABS: 25.0000 mg | ORAL_TABLET | Freq: Every day | ORAL | 3 refills | Status: DC

## 2021-03-12 NOTE — Telephone Encounter (Signed)
Dr. Einar Pheasant,,  See pt's BP readings in mychart message.

## 2021-03-17 ENCOUNTER — Other Ambulatory Visit: Payer: Self-pay | Admitting: Family Medicine

## 2021-03-17 DIAGNOSIS — E66813 Obesity, class 3: Secondary | ICD-10-CM

## 2021-04-06 ENCOUNTER — Encounter: Payer: Self-pay | Admitting: Family Medicine

## 2021-04-07 NOTE — Telephone Encounter (Signed)
Please have patient scheduled to see Dr. Einar Pheasant for follow up regarding her BP soon after her return. Her BP has increased despite the increased dose of her amlodipine to 10 mg so something isn't adding up.

## 2021-04-13 ENCOUNTER — Other Ambulatory Visit: Payer: Self-pay

## 2021-04-13 ENCOUNTER — Encounter: Payer: Self-pay | Admitting: Family Medicine

## 2021-04-13 ENCOUNTER — Ambulatory Visit (INDEPENDENT_AMBULATORY_CARE_PROVIDER_SITE_OTHER): Payer: BC Managed Care – PPO | Admitting: Family Medicine

## 2021-04-13 VITALS — BP 138/96 | HR 75 | Temp 97.7°F | Ht 68.75 in | Wt 312.0 lb

## 2021-04-13 DIAGNOSIS — I10 Essential (primary) hypertension: Secondary | ICD-10-CM

## 2021-04-13 DIAGNOSIS — L309 Dermatitis, unspecified: Secondary | ICD-10-CM

## 2021-04-13 MED ORDER — CLOBETASOL PROPIONATE 0.05 % EX OINT: 1.0000 "application " | TOPICAL_OINTMENT | Freq: Two times a day (BID) | CUTANEOUS | 0 refills | Status: DC

## 2021-04-13 MED ORDER — METOPROLOL SUCCINATE ER 25 MG PO TB24: 25.0000 mg | ORAL_TABLET | Freq: Every day | ORAL | 0 refills | Status: DC

## 2021-04-13 NOTE — Progress Notes (Signed)
Subjective:     [name] is a 45 y.o. female presenting for Hypertension (Has been getting higher readings at home: high as 160s/110s. Started amlodipine and HCTZ 150s over 100s) and Rash (Started a few months ago. Only on arms. No changes in chemicals at home. Wears uniforms at work. Has tried Benadryl, cortisone cream, Benadryl cream.)     Hypertension  Rash   #Rash - has been present for months - only on arms - started before the medicaiton - thought it might be related to uniform but has been doing short sleeves - itchy - has done hydrocortisone cream and benadryl cream w/o improvement  #Hypertension - on HCTZ - getting cramps - no improvement in bp - weight is up but was holding her purse during weight check -   #obesity - was on phentermine w/ improvement  - but knows with bp  Review of Systems  Skin:  Positive for rash.    Social History   Tobacco Use  Smoking Status Never  Smokeless Tobacco Never        Objective:    BP Readings from Last 3 Encounters:  04/13/21 (!) 138/96  02/18/21 (!) 130/100  11/08/20 (!) 135/96   Wt Readings from Last 3 Encounters:  04/13/21 (!) 312 lb (141.5 kg)  02/18/21 (!) 308 lb 4 oz (139.8 kg)  02/27/20 (!) 299 lb 4 oz (135.7 kg)    BP (!) 138/96 (BP Location: Left Arm, Patient Position: Sitting, Cuff Size: Large)   Pulse 75   Temp 97.7 F (36.5 C)   Ht 5' 8.75" (1.746 m)   Wt (!) 312 lb (141.5 kg)   SpO2 98%   BMI 46.41 kg/m    Physical Exam Constitutional:      General: She is not in acute distress.    Appearance: She is well-developed. She is not diaphoretic.  HENT:     Right Ear: External ear normal.     Left Ear: External ear normal.  Eyes:     Conjunctiva/sclera: Conjunctivae normal.  Cardiovascular:     Rate and Rhythm: Normal rate.  Pulmonary:     Effort: Pulmonary effort is normal.  Musculoskeletal:     Cervical back: Neck supple.  Skin:    General: Skin is warm and dry.      Capillary Refill: Capillary refill takes less than 2 seconds.     Comments: Raised macular papular rash on bilateral arms with erythematous skin  Neurological:     Mental Status: She is alert. Mental status is at baseline.  Psychiatric:        Mood and Affect: Mood normal.        Behavior: Behavior normal.          Assessment & Plan:   Problem List Items Addressed This Visit       Cardiovascular and Mediastinum   Essential hypertension - Primary    Poorly controlled. Stop HCTZ as cramping and no improvement. Pt hesitant to try ACE/ARB. So will cont amlodipine 10 mg and add metoprolol suc 25 mg daily. Update via mychart in 2-3 weeks.       Relevant Medications   metoprolol succinate (TOPROL-XL) 25 MG 24 hr tablet     Musculoskeletal and Integument   Dermatitis    Suspect some kind of dermatitis vs eczema. No response to son's triamcinolone so will try clobetasol. Referral to derm if no improvement.       Relevant Medications   clobetasol ointment (TEMOVATE)  0.05 %   Other Relevant Orders   Ambulatory referral to Dermatology     Other   Obesity, Class III, BMI 40-49.9 (morbid obesity) ([place])    Work on [employer]. Check with insurance about ozempic/wegovy.         Return if symptoms worsen or fail to improve.  Lesleigh Noe, MD  This visit occurred during the SARS-CoV-2 public health emergency.  Safety protocols were in place, including screening questions prior to the visit, additional usage of staff PPE, and extensive cleaning of exam room while observing appropriate contact time as indicated for disinfecting solutions.

## 2021-04-13 NOTE — Patient Instructions (Addendum)
Ozempic/Wegovy - check insurance  Blood pressure - continue amlodipine - start metoprolol - if low heart rate, cut in half - mychart message in 2 weeks with updates  Rash - start cream, twice daily - derm referral if not improved

## 2021-04-13 NOTE — Assessment & Plan Note (Signed)
Work on [employer]. Check with insurance about ozempic/wegovy.

## 2021-04-13 NOTE — Assessment & Plan Note (Signed)
Suspect some kind of dermatitis vs eczema. No response to son's triamcinolone so will try clobetasol. Referral to derm if no improvement.

## 2021-04-13 NOTE — Assessment & Plan Note (Signed)
Poorly controlled. Stop HCTZ as cramping and no improvement. Pt hesitant to try ACE/ARB. So will cont amlodipine 10 mg and add metoprolol suc 25 mg daily. Update via mychart in 2-3 weeks.

## 2021-05-05 ENCOUNTER — Other Ambulatory Visit: Payer: Self-pay | Admitting: Family Medicine

## 2021-05-05 DIAGNOSIS — I10 Essential (primary) hypertension: Secondary | ICD-10-CM

## 2021-05-17 ENCOUNTER — Other Ambulatory Visit: Payer: Self-pay | Admitting: Family Medicine

## 2021-05-17 DIAGNOSIS — I10 Essential (primary) hypertension: Secondary | ICD-10-CM

## 2021-06-02 ENCOUNTER — Encounter: Payer: Self-pay | Admitting: Family Medicine

## 2021-08-11 ENCOUNTER — Other Ambulatory Visit: Payer: Self-pay | Admitting: Family Medicine

## 2021-08-11 DIAGNOSIS — L309 Dermatitis, unspecified: Secondary | ICD-10-CM

## 2021-08-13 ENCOUNTER — Other Ambulatory Visit: Payer: Self-pay | Admitting: Family Medicine

## 2021-09-04 ENCOUNTER — Other Ambulatory Visit: Payer: Self-pay | Admitting: Family Medicine

## 2021-09-04 DIAGNOSIS — I10 Essential (primary) hypertension: Secondary | ICD-10-CM

## 2021-09-07 MED ORDER — IBUPROFEN 800 MG PO TABS: 800.0000 mg | ORAL_TABLET | Freq: Three times a day (TID) | ORAL | 0 refills | Status: DC | PRN

## 2021-09-07 MED ORDER — METOPROLOL SUCCINATE ER 25 MG PO TB24: 25.0000 mg | ORAL_TABLET | Freq: Every day | ORAL | 0 refills | Status: DC

## 2021-09-07 NOTE — Telephone Encounter (Signed)
Please have patient schedule f/u visit to discuss blood pressure medication.   Last mychart messages indicated poor control.

## 2021-09-07 NOTE — Telephone Encounter (Signed)
There were some messages back and forth between patient and provider via mychart. Please review refill requests and when did patient need to come back for follow up?

## 2021-09-07 NOTE — Telephone Encounter (Signed)
Called pt to get scheduled for follow up. Lvmtcb

## 2021-09-17 ENCOUNTER — Ambulatory Visit: Payer: BC Managed Care – PPO | Admitting: Family Medicine

## 2021-09-30 ENCOUNTER — Other Ambulatory Visit: Payer: Self-pay

## 2021-09-30 ENCOUNTER — Ambulatory Visit: Payer: BC Managed Care – PPO | Admitting: Family Medicine

## 2021-09-30 ENCOUNTER — Encounter: Payer: Self-pay | Admitting: Family Medicine

## 2021-09-30 VITALS — BP 162/100 | HR 90 | Ht 69.0 in | Wt 321.0 lb

## 2021-09-30 DIAGNOSIS — I10 Essential (primary) hypertension: Secondary | ICD-10-CM

## 2021-09-30 DIAGNOSIS — G4726 Circadian rhythm sleep disorder, shift work type: Secondary | ICD-10-CM | POA: Diagnosis not present

## 2021-09-30 DIAGNOSIS — R748 Abnormal levels of other serum enzymes: Secondary | ICD-10-CM

## 2021-09-30 DIAGNOSIS — R252 Cramp and spasm: Secondary | ICD-10-CM | POA: Diagnosis not present

## 2021-09-30 MED ORDER — SPIRONOLACTONE 25 MG PO TABS: 25.0000 mg | ORAL_TABLET | Freq: Every day | ORAL | 1 refills | Status: DC

## 2021-09-30 NOTE — Assessment & Plan Note (Signed)
Do suspect this may be related to being out of shape and lack of exercise.  Advised regular stretching and workout routine.  Offered physical therapy for closer assessment of muscles she declined at this time.  We will check labs including electrolytes for monitoring of spironolactone.  Of note she previously had an elevated CK but never came back in for follow-up lab tests.  Will check CK again and consider additional work-up if still elevated ?

## 2021-09-30 NOTE — Progress Notes (Signed)
? ?Subjective:  ? ?  ?[name] is a 46 y.o. female presenting for Hypertension (Pt stated--d/c metoprolol 2 weeks ago due to causing body cramps.) ?  ? ? ?Hypertension ? ? ?#HTN ?- failed hctz due to cramping ?- failed metoprolol due to cramping and body aches ?- having HA ?- no sob, cp ?- also getting some blurred vision - glasses help some ?- last eye appt 6 months ago ? ?Was taking amlodipine and metoprolol and was still getting high BP ?- ran out of metoprolol and body aches went away ? ?Nervous about the angioedema with ACE/ARB ? ?Is still having muscle cramping ?- hx of elevated CK ?- cramping through the legs ?- toes, jaws ?- will take muscle relaxants ?- Worst/most frequent: will also get stomach cramps - no longer has GB and gets diarrhea but this is not related to the stomach cramps ?- no regular stretching/exercise ?- getting cramps in legs during intercourse ? ? ?Review of Systems ? ? ?Social History  ? ?Tobacco Use  ?Smoking Status Never  ?Smokeless Tobacco Never  ? ? ? ?   ?Objective:  ?  ?BP Readings from Last 3 Encounters:  ?09/30/21 (!) 162/100  ?04/13/21 (!) 138/96  ?02/18/21 (!) 130/100  ? ?Wt Readings from Last 3 Encounters:  ?09/30/21 (!) 321 lb (145.6 kg)  ?04/13/21 (!) 312 lb (141.5 kg)  ?02/18/21 (!) 308 lb 4 oz (139.8 kg)  ? ? ?BP (!) 162/100   Pulse 90   Ht 5\' 9"  (1.753 m)   Wt (!) 321 lb (145.6 kg)   SpO2 97%   BMI 47.40 kg/m?  ? ? ?Physical Exam ?Constitutional:   ?   General: She is not in acute distress. ?   Appearance: She is well-developed. She is not diaphoretic.  ?HENT:  ?   Right Ear: External ear normal.  ?   Left Ear: External ear normal.  ?Eyes:  ?   Conjunctiva/sclera: Conjunctivae normal.  ?Cardiovascular:  ?   Rate and Rhythm: Normal rate and regular rhythm.  ?Pulmonary:  ?   Effort: Pulmonary effort is normal. No respiratory distress.  ?   Breath sounds: Normal breath sounds. No wheezing.  ?Musculoskeletal:  ?   Cervical back: Neck supple.  ?Skin: ?    General: Skin is warm and dry.  ?   Capillary Refill: Capillary refill takes less than 2 seconds.  ?Neurological:  ?   Mental Status: She is alert. Mental status is at baseline.  ?Psychiatric:     ?   Mood and Affect: Mood normal.     ?   Behavior: Behavior normal.  ? ? ? ? ? ?   ?Assessment & Plan:  ? ?Problem List Items Addressed This Visit   ? ?  ? Cardiovascular and Mediastinum  ? Essential hypertension - Primary  ?  Blood pressure elevated.  Intolerant of HCTZ and metoprolol, though questionable as she notes cramping is the main cause but she continues to have cramping of medication.  Continue amlodipine 10 mg, start spironolactone 25 mg.  She is worried about possible angioedema with ACE or ARB, discussed if blood pressure not responding would likely consider advanced hypertension clinic for continued support and treatment. ?  ?  ? Relevant Medications  ? spironolactone (ALDACTONE) 25 MG tablet  ? Other Relevant Orders  ? CBC  ? Comprehensive metabolic panel  ? Ferritin  ? Lipid panel  ? TSH  ?  ? Other  ? Shift work sleep  disorder  ?  Doing well on as needed Ambien. ?  ?  ? Muscle cramps  ?  Do suspect this may be related to being out of shape and lack of exercise.  Advised regular stretching and workout routine.  Offered physical therapy for closer assessment of muscles she declined at this time.  We will check labs including electrolytes for monitoring of spironolactone.  Of note she previously had an elevated CK but never came back in for follow-up lab tests.  Will check CK again and consider additional work-up if still elevated ?  ?  ? ?Other Visit Diagnoses   ? ? Muscle cramping      ? Relevant Orders  ? CBC  ? Comprehensive metabolic panel  ? Ferritin  ? Elevated CK      ? Relevant Orders  ? CK  ? Morbid obesity ([place])      ? Relevant Orders  ? Hemoglobin A1c  ? ?  ? ? ? ?Return in about 4 weeks (around 10/28/2021) for blood pressure. ? ?Lesleigh Noe, MD ? ?This visit occurred during the SARS-CoV-2  public health emergency.  Safety protocols were in place, including screening questions prior to the visit, additional usage of staff PPE, and extensive cleaning of exam room while observing appropriate contact time as indicated for disinfecting solutions.  ? ?

## 2021-09-30 NOTE — Assessment & Plan Note (Signed)
Doing well on as needed Ambien. ?

## 2021-09-30 NOTE — Assessment & Plan Note (Signed)
Blood pressure elevated.  Intolerant of HCTZ and metoprolol, though questionable as she notes cramping is the main cause but she continues to have cramping of medication.  Continue amlodipine 10 mg, start spironolactone 25 mg.  She is worried about possible angioedema with ACE or ARB, discussed if blood pressure not responding would likely consider advanced hypertension clinic for continued support and treatment. ?

## 2021-09-30 NOTE — Patient Instructions (Addendum)
Cramping - would recommend doing a core exercise - Pilates might be a good option - would also recommend finding a youtube video and doing a lower body stretching routine for 5-10 minutes 1-2 times per day - mychart if you want a referral to PT  Blood pressure - start spironolactone - continue amlodipine - update via mychart in 1 week - return for labs in 1 week  Check with insurance about Wegovy or Mounjaro  DASH Eating Plan DASH stands for Dietary Approaches to Stop Hypertension. The DASH eating plan is a healthy eating plan that has been shown to: Reduce high blood pressure (hypertension). Reduce your risk for type 2 diabetes, heart disease, and stroke. Help with weight loss. What are tips for following this plan? Reading food labels Check food labels for the amount of salt (sodium) per serving. Choose foods with less than 5 percent of the Daily Value of sodium. Generally, foods with less than 300 milligrams (mg) of sodium per serving fit into this eating plan. To find whole grains, look for the word "whole" as the first word in the ingredient list. Shopping Buy products labeled as "low-sodium" or "no salt added." Buy fresh foods. Avoid canned foods and pre-made or frozen meals. Cooking Avoid adding salt when cooking. Use salt-free seasonings or herbs instead of table salt or sea salt. Check with your health care provider or pharmacist before using salt substitutes. Do not fry foods. Cook foods using healthy methods such as baking, boiling, grilling, roasting, and broiling instead. Cook with heart-healthy oils, such as olive, canola, avocado, soybean, or sunflower oil. Meal planning  Eat a balanced diet that includes: 4 or more servings of fruits and 4 or more servings of vegetables each day. Try to fill one-half of your plate with fruits and vegetables. 6-8 servings of whole grains each day. Less than 6 oz (170 g) of lean meat, poultry, or fish each day. A 3-oz (85-g) serving of  meat is about the same size as a deck of cards. One egg equals 1 oz (28 g). 2-3 servings of low-fat dairy each day. One serving is 1 cup (237 mL). 1 serving of nuts, seeds, or beans 5 times each week. 2-3 servings of heart-healthy fats. Healthy fats called omega-3 fatty acids are found in foods such as walnuts, flaxseeds, fortified milks, and eggs. These fats are also found in cold-water fish, such as sardines, salmon, and mackerel. Limit how much you eat of: Canned or prepackaged foods. Food that is high in trans fat, such as some fried foods. Food that is high in saturated fat, such as fatty meat. Desserts and other sweets, sugary drinks, and other foods with added sugar. Full-fat dairy products. Do not salt foods before eating. Do not eat more than 4 egg yolks a week. Try to eat at least 2 vegetarian meals a week. Eat more home-cooked food and less restaurant, buffet, and fast food. Lifestyle When eating at a restaurant, ask that your food be prepared with less salt or no salt, if possible. If you drink alcohol: Limit how much you use to: 0-1 drink a day for women who are not pregnant. 0-2 drinks a day for men. Be aware of how much alcohol is in your drink. In the [place] one drink equals one 12 oz bottle of beer (355 mL), one 5 oz glass of wine (148 mL), or one 1 oz glass of hard liquor (44 mL). General information Avoid eating more than 2,300 mg of salt a day.  If you have hypertension, you may need to reduce your sodium intake to 1,500 mg a day. Work with your health care provider to maintain a healthy body weight or to lose weight. Ask what an ideal weight is for you. Get at least 30 minutes of exercise that causes your heart to beat faster (aerobic exercise) most days of the week. Activities may include walking, swimming, or biking. Work with your health care provider or dietitian to adjust your eating plan to your individual calorie needs. What foods should I eat? Fruits All fresh,  dried, or frozen fruit. Canned fruit in natural juice (without added sugar). Vegetables Fresh or frozen vegetables (raw, steamed, roasted, or grilled). Low-sodium or reduced-sodium tomato and vegetable juice. Low-sodium or reduced-sodium tomato sauce and tomato paste. Low-sodium or reduced-sodium canned vegetables. Grains Whole-grain or whole-wheat bread. Whole-grain or whole-wheat pasta. Brown rice. [place]. Bulgur. Whole-grain and low-sodium cereals. Pita bread. Low-fat, low-sodium crackers. Whole-wheat flour tortillas. Meats and other proteins Skinless chicken or [place]. Ground chicken or [place]. Pork with fat trimmed off. Fish and seafood. Egg whites. Dried beans, peas, or lentils. Unsalted nuts, nut butters, and seeds. Unsalted canned beans. Lean cuts of beef with fat trimmed off. Low-sodium, lean precooked or cured meat, such as sausages or meat loaves. Dairy Low-fat (1%) or fat-free (skim) milk. Reduced-fat, low-fat, or fat-free cheeses. Nonfat, low-sodium ricotta or cottage cheese. Low-fat or nonfat yogurt. Low-fat, low-sodium cheese. Fats and oils Soft margarine without trans fats. Vegetable oil. Reduced-fat, low-fat, or light mayonnaise and salad dressings (reduced-sodium). Canola, safflower, olive, avocado, soybean, and sunflower oils. Avocado. Seasonings and condiments Herbs. Spices. Seasoning mixes without salt. Other foods Unsalted popcorn and pretzels. Fat-free sweets. The items listed above may not be a complete list of foods and beverages you can eat. Contact a dietitian for more information. What foods should I avoid? Fruits Canned fruit in a light or heavy syrup. Fried fruit. Fruit in cream or butter sauce. Vegetables Creamed or fried vegetables. Vegetables in a cheese sauce. Regular canned vegetables (not low-sodium or reduced-sodium). Regular canned tomato sauce and paste (not low-sodium or reduced-sodium). Regular tomato and vegetable juice (not low-sodium or  reduced-sodium). Angie Fava. Olives. Grains Baked goods made with fat, such as croissants, muffins, or some breads. Dry pasta or rice meal packs. Meats and other proteins Fatty cuts of meat. Ribs. Fried meat. Berniece Salines. Bologna, salami, and other precooked or cured meats, such as sausages or meat loaves. Fat from the back of a pig (fatback). Bratwurst. Salted nuts and seeds. Canned beans with added salt. Canned or smoked fish. Whole eggs or egg yolks. Chicken or [place] with skin. Dairy Whole or 2% milk, cream, and half-and-half. Whole or full-fat cream cheese. Whole-fat or sweetened yogurt. Full-fat cheese. Nondairy creamers. Whipped toppings. Processed cheese and cheese spreads. Fats and oils Butter. Stick margarine. Lard. Shortening. Ghee. Bacon fat. Tropical oils, such as coconut, palm kernel, or palm oil. Seasonings and condiments Onion salt, garlic salt, seasoned salt, table salt, and sea salt. Worcestershire sauce. Tartar sauce. Barbecue sauce. Teriyaki sauce. Soy sauce, including reduced-sodium. Steak sauce. Canned and packaged gravies. Fish sauce. Oyster sauce. Cocktail sauce. Store-bought horseradish. Ketchup. Mustard. Meat flavorings and tenderizers. Bouillon cubes. Hot sauces. Pre-made or packaged marinades. Pre-made or packaged taco seasonings. Relishes. Regular salad dressings. Other foods Salted popcorn and pretzels. The items listed above may not be a complete list of foods and beverages you should avoid. Contact a dietitian for more information. Where to find more information National Heart, Lung,  and Blood Institute: https://wilson-eaton.com/ American Heart Association: www.heart.org Academy of Nutrition and Dietetics: www.eatright.[place]: www.kidney.org Summary The DASH eating plan is a healthy eating plan that has been shown to reduce high blood pressure (hypertension). It may also reduce your risk for type 2 diabetes, heart disease, and stroke. When on the DASH  eating plan, aim to eat more fresh fruits and vegetables, whole grains, lean proteins, low-fat dairy, and heart-healthy fats. With the DASH eating plan, you should limit salt (sodium) intake to 2,300 mg a day. If you have hypertension, you may need to reduce your sodium intake to 1,500 mg a day. Work with your health care provider or dietitian to adjust your eating plan to your individual calorie needs. This information is not intended to replace advice given to you by your health care provider. Make sure you discuss any questions you have with your health care provider. Document Revised: 06/21/2019 Document Reviewed: 06/21/2019 Elsevier Patient Education  2022 [employer].

## 2021-10-08 ENCOUNTER — Other Ambulatory Visit (INDEPENDENT_AMBULATORY_CARE_PROVIDER_SITE_OTHER): Payer: BC Managed Care – PPO

## 2021-10-08 ENCOUNTER — Other Ambulatory Visit: Payer: Self-pay | Admitting: Family Medicine

## 2021-10-08 ENCOUNTER — Other Ambulatory Visit: Payer: Self-pay

## 2021-10-08 DIAGNOSIS — I10 Essential (primary) hypertension: Secondary | ICD-10-CM

## 2021-10-08 DIAGNOSIS — R748 Abnormal levels of other serum enzymes: Secondary | ICD-10-CM | POA: Diagnosis not present

## 2021-10-08 DIAGNOSIS — R252 Cramp and spasm: Secondary | ICD-10-CM | POA: Diagnosis not present

## 2021-10-08 LAB — COMPREHENSIVE METABOLIC PANEL
ALT: 36 U/L — ABNORMAL HIGH (ref 0–35)
AST: 25 U/L (ref 0–37)
Albumin: 4.7 g/dL (ref 3.5–5.2)
Alkaline Phosphatase: 50 U/L (ref 39–117)
BUN: 14 mg/dL (ref 6–23)
CO2: 25 mEq/L (ref 19–32)
Calcium: 9.3 mg/dL (ref 8.4–10.5)
Chloride: 102 mEq/L (ref 96–112)
Creatinine, Ser: 1.1 mg/dL (ref 0.40–1.20)
GFR: 61.12 mL/min (ref >60.00)
Glucose, Bld: 89 mg/dL (ref 70–99)
Potassium: 4.1 mEq/L (ref 3.5–5.1)
Sodium: 135 mEq/L (ref 135–145)
Total Bilirubin: 0.5 mg/dL (ref 0.2–1.2)
Total Protein: 7.3 g/dL (ref 6.0–8.3)

## 2021-10-08 LAB — CBC
HCT: 40.4 % (ref 36.0–46.0)
Hemoglobin: 13 g/dL (ref 12.0–15.0)
MCHC: 32.3 g/dL (ref 30.0–36.0)
MCV: 83.1 fl (ref 78.0–100.0)
Platelets: 259 10*3/uL (ref 150.0–400.0)
RBC: 4.87 Mil/uL (ref 3.87–5.11)
RDW: 14.5 % (ref 11.5–15.5)
WBC: 4.3 10*3/uL (ref 4.0–10.5)

## 2021-10-08 LAB — FERRITIN: Ferritin: 89.7 ng/mL (ref 10.0–291.0)

## 2021-10-08 LAB — LIPID PANEL
Cholesterol: 188 mg/dL (ref 0–200)
HDL: 34.5 mg/dL — ABNORMAL LOW (ref >39.00)
LDL Cholesterol: 130 mg/dL — ABNORMAL HIGH (ref 0–99)
NonHDL: 153.95
Total CHOL/HDL Ratio: 5
Triglycerides: 122 mg/dL (ref 0.0–149.0)
VLDL: 24.4 mg/dL (ref 0.0–40.0)

## 2021-10-08 LAB — HEMOGLOBIN A1C: Hgb A1c MFr Bld: 5.8 % (ref 4.6–6.5)

## 2021-10-08 LAB — CK: Total CK: 364 U/L — ABNORMAL HIGH (ref 7–177)

## 2021-10-08 LAB — TSH: TSH: 3.73 u[IU]/mL (ref 0.35–5.50)

## 2021-10-08 NOTE — Telephone Encounter (Signed)
Name of Medication: cyclobenzaprine 10 mg ?Name of Pharmacy: [facility] ?Last Fill or Written Date and Quantity: # 30 on 08/14/2021 ?Last Office Visit and Type: 09/30/2021 hypertension ?Next Office Visit and Type: none scheduled ? ? ?

## 2021-10-08 NOTE — Telephone Encounter (Signed)
?  Encourage patient to contact the pharmacy for refills or they can request refills through [facility] ? ?Did the patient contact the pharmacy:  N ? ? ?LAST APPOINTMENT DATE:  09/30/21 ? ?NEXT APPOINTMENT DATE: not scheduled ? ?MEDICATION: cyclobenzaprine (FLEXERIL) 10 MG tablet ? ?Is the patient out of medication? Unknown ? ?If not, how much is left? ? ?Is this a 90 day supply:  ? ?PHARMACY:  ? [id number] [place] [place]  [phone] ?Fax:  [phone] ?  ? ? ?Let patient know to contact pharmacy at the end of the day to make sure medication is ready. ? ?Please notify patient to allow 48-72 hours to process ?  ?

## 2021-10-11 MED ORDER — CYCLOBENZAPRINE HCL 10 MG PO TABS: 5.0000 mg | ORAL_TABLET | Freq: Every evening | ORAL | 0 refills | Status: DC | PRN

## 2021-10-12 ENCOUNTER — Encounter: Payer: Self-pay | Admitting: Family Medicine

## 2021-10-12 DIAGNOSIS — R252 Cramp and spasm: Secondary | ICD-10-CM

## 2021-10-12 DIAGNOSIS — R748 Abnormal levels of other serum enzymes: Secondary | ICD-10-CM

## 2021-10-28 ENCOUNTER — Other Ambulatory Visit: Payer: Self-pay | Admitting: Family Medicine

## 2021-10-28 DIAGNOSIS — I10 Essential (primary) hypertension: Secondary | ICD-10-CM

## 2021-11-19 ENCOUNTER — Encounter: Payer: Self-pay | Admitting: Family Medicine

## 2021-11-26 ENCOUNTER — Other Ambulatory Visit: Payer: Self-pay | Admitting: Family Medicine

## 2021-11-26 DIAGNOSIS — I10 Essential (primary) hypertension: Secondary | ICD-10-CM

## 2021-12-13 ENCOUNTER — Other Ambulatory Visit: Payer: Self-pay | Admitting: Family Medicine

## 2021-12-13 DIAGNOSIS — I10 Essential (primary) hypertension: Secondary | ICD-10-CM

## 2021-12-14 NOTE — Telephone Encounter (Signed)
Is this okay to change to 90 days  ?

## 2021-12-24 ENCOUNTER — Other Ambulatory Visit: Payer: Self-pay | Admitting: Family Medicine

## 2021-12-24 NOTE — Telephone Encounter (Signed)
Last filled 05-11-21 #30 Last OV 09-30-21 No Future OV [facility]

## 2021-12-31 ENCOUNTER — Telehealth: Payer: Self-pay

## 2021-12-31 ENCOUNTER — Ambulatory Visit (INDEPENDENT_AMBULATORY_CARE_PROVIDER_SITE_OTHER): Payer: BC Managed Care – PPO | Admitting: Family Medicine

## 2021-12-31 ENCOUNTER — Encounter: Payer: Self-pay | Admitting: Family Medicine

## 2021-12-31 VITALS — BP 132/94 | HR 73 | Temp 98.5°F | Resp 16 | Ht 69.0 in | Wt 307.4 lb

## 2021-12-31 DIAGNOSIS — R109 Unspecified abdominal pain: Secondary | ICD-10-CM

## 2021-12-31 DIAGNOSIS — R10A0 Flank pain, unspecified side: Secondary | ICD-10-CM

## 2021-12-31 DIAGNOSIS — I10 Essential (primary) hypertension: Secondary | ICD-10-CM | POA: Diagnosis not present

## 2021-12-31 DIAGNOSIS — R011 Cardiac murmur, unspecified: Secondary | ICD-10-CM | POA: Diagnosis not present

## 2021-12-31 DIAGNOSIS — R809 Proteinuria, unspecified: Secondary | ICD-10-CM

## 2021-12-31 DIAGNOSIS — E66813 Obesity, class 3: Secondary | ICD-10-CM

## 2021-12-31 LAB — POCT URINALYSIS DIPSTICK
Bilirubin, UA: NEGATIVE
Blood, UA: NEGATIVE
Glucose, UA: NEGATIVE
Ketones, UA: NEGATIVE
Leukocytes, UA: NEGATIVE
Nitrite, UA: NEGATIVE
Protein, UA: POSITIVE — AB
Spec Grav, UA: >=1.03 — AB (ref 1.010–1.025)
Urobilinogen, UA: 0.2 E.U./dL
pH, UA: 5.5 (ref 5.0–8.0)

## 2021-12-31 LAB — BASIC METABOLIC PANEL
BUN: 13 mg/dL (ref 6–23)
CO2: 23 mEq/L (ref 19–32)
Calcium: 9.1 mg/dL (ref 8.4–10.5)
Chloride: 106 mEq/L (ref 96–112)
Creatinine, Ser: 0.95 mg/dL (ref 0.40–1.20)
GFR: 72.76 mL/min (ref >60.00)
Glucose, Bld: 99 mg/dL (ref 70–99)
Potassium: 4.2 mEq/L (ref 3.5–5.1)
Sodium: 135 mEq/L (ref 135–145)

## 2021-12-31 MED ORDER — WEGOVY 0.25 MG/0.5ML ~~LOC~~ SOAJ: 0.2500 mg | SUBCUTANEOUS | 0 refills | Status: DC

## 2021-12-31 MED ORDER — SPIRONOLACTONE 50 MG PO TABS: 50.0000 mg | ORAL_TABLET | Freq: Every day | ORAL | 0 refills | Status: DC

## 2021-12-31 MED ORDER — IBUPROFEN 800 MG PO TABS: 800.0000 mg | ORAL_TABLET | Freq: Three times a day (TID) | ORAL | 0 refills | Status: DC | PRN

## 2021-12-31 NOTE — Patient Instructions (Addendum)
Hypertension - continue amlodipine - increase spironolactone to 50 mg - nurse visit in 2 weeks with your home cuff  Flank pain - urine and blood work - if abnormal will get ultrasound of the kidneys

## 2021-12-31 NOTE — Progress Notes (Signed)
Subjective:     [name] is a 46 y.o. female presenting for Hypertension     HPI  #Hypertension - Taking spironolactone and amlodipine - could not take HCTZ and metoprolol  - checking at home 150s/110 - wrist cuff at home - still getting some blurred vision - saw eye doctor, not using glasses - does have occasional chest pain with reaching for something  Has lost 15 lbs Trying to eat right  Hard time finding time to exercise  #Bilateral flank pain - a few days - better now - no blood in urine, no burning -     Review of Systems  09/30/2021: Clinic - HTN - amlodipine, start spironolactone 11/19/2021: mychart - high bp, not working  Social History   Tobacco Use  Smoking Status Never  Smokeless Tobacco Never        Objective:    BP Readings from Last 3 Encounters:  12/31/21 (!) 132/94  09/30/21 (!) 162/100  04/13/21 (!) 138/96   Wt Readings from Last 3 Encounters:  12/31/21 (!) 307 lb 6 oz (139.4 kg)  09/30/21 (!) 321 lb (145.6 kg)  04/13/21 (!) 312 lb (141.5 kg)    BP (!) 132/94   Pulse 73   Temp 98.5 F (36.9 C)   Resp 16   Ht '5\' 9"'$  (1.753 m)   Wt (!) 307 lb 6 oz (139.4 kg)   SpO2 97%   BMI 45.39 kg/m    Physical Exam Constitutional:      General: She is not in acute distress.    Appearance: She is well-developed. She is not diaphoretic.  HENT:     Right Ear: External ear normal.     Left Ear: External ear normal.     Nose: Nose normal.  Eyes:     Conjunctiva/sclera: Conjunctivae normal.  Cardiovascular:     Rate and Rhythm: Normal rate and regular rhythm.     Heart sounds: Murmur heard.  Pulmonary:     Effort: Pulmonary effort is normal. No respiratory distress.     Breath sounds: Normal breath sounds. No wheezing.  Musculoskeletal:     Cervical back: Neck supple.  Skin:    General: Skin is warm and dry.     Capillary Refill: Capillary refill takes less than 2 seconds.  Neurological:     Mental Status: She is alert.  Mental status is at baseline.  Psychiatric:        Mood and Affect: Mood normal.        Behavior: Behavior normal.          Assessment & Plan:   Problem List Items Addressed This Visit       Cardiovascular and Mediastinum   Essential hypertension - Primary    Improved on spironolactone 25 mg but not at goal. Increase 50 mg. Cont amlodipine 10 mg. Nurse visit with home cuff in 2 weeks.        Relevant Medications   spironolactone (ALDACTONE) 50 MG tablet   Other Relevant Orders   Basic metabolic panel   POCT urinalysis dipstick (Completed)   US RENAL     Other   Obesity, Class III, BMI 40-49.9 (morbid obesity) ([place])    Pt interested in trying to get wegovy covered. If not covered advised healthy weight and wellness.        Relevant Medications   ibuprofen (ADVIL) 800 MG tablet   Semaglutide-Weight Management (WEGOVY) 0.25 MG/0.5ML SOAJ   Murmur    Pt  notes life long. Anticipate getting echo if bp does normalize       Proteinuria    In the setting of persistent HTN, recent flank pain, and proteinuria will get Renal US to evaluate kidney. Also check bmp        Relevant Orders   US RENAL   Other Visit Diagnoses     Flank pain       Relevant Orders   US RENAL        Return in about 2 weeks (around 01/14/2022) for nurse visit, 3 months for me.  Lesleigh Noe, MD

## 2021-12-31 NOTE — Assessment & Plan Note (Signed)
Pt notes life long. Anticipate getting echo if bp does normalize

## 2021-12-31 NOTE — Telephone Encounter (Signed)
Prior auth started for [facility] 0.'25MG'$ /0.5ML auto-injectors [name] (Key: [id number]) Rx #: [id number] Waiting for determination.

## 2021-12-31 NOTE — Assessment & Plan Note (Signed)
In the setting of persistent HTN, recent flank pain, and proteinuria will get Renal US to evaluate kidney. Also check bmp

## 2021-12-31 NOTE — Assessment & Plan Note (Signed)
Improved on spironolactone 25 mg but not at goal. Increase 50 mg. Cont amlodipine 10 mg. Nurse visit with home cuff in 2 weeks.

## 2021-12-31 NOTE — Assessment & Plan Note (Signed)
Pt interested in trying to get wegovy covered. If not covered advised healthy weight and wellness.

## 2022-01-03 ENCOUNTER — Ambulatory Visit
Admission: RE | Admit: 2022-01-03 | Discharge: 2022-01-03 | Disposition: A | Discharge status: Home / Self Care | Payer: BC Managed Care – PPO | Source: Ambulatory Visit | Attending: Family Medicine | Admitting: Family Medicine

## 2022-01-03 DIAGNOSIS — R809 Proteinuria, unspecified: Secondary | ICD-10-CM

## 2022-01-03 DIAGNOSIS — R109 Unspecified abdominal pain: Secondary | ICD-10-CM

## 2022-01-03 DIAGNOSIS — I10 Essential (primary) hypertension: Secondary | ICD-10-CM

## 2022-01-04 NOTE — Telephone Encounter (Signed)
The previous PA key [id number] was listed as can not be processed.  The PA is closed.  Please submit a new request.   Started new prior auth for [facility] 0.'25MG'$ /0.5ML auto-injectors.   Sandhya Prothero (Key: [id number]) Waiting for determination.

## 2022-01-05 NOTE — Telephone Encounter (Signed)
[facility] has approved Wegovy for the following time period: 01/04/22-08/06/22

## 2022-01-06 NOTE — Telephone Encounter (Signed)
Sent approval letter for [facility] to scanning.

## 2022-01-07 ENCOUNTER — Ambulatory Visit: Payer: BC Managed Care – PPO

## 2022-01-08 ENCOUNTER — Other Ambulatory Visit: Payer: Self-pay | Admitting: Family Medicine

## 2022-01-08 DIAGNOSIS — I10 Essential (primary) hypertension: Secondary | ICD-10-CM

## 2022-01-14 ENCOUNTER — Ambulatory Visit: Payer: BC Managed Care – PPO | Admitting: Family Medicine

## 2022-01-14 ENCOUNTER — Ambulatory Visit: Payer: BC Managed Care – PPO

## 2022-01-20 ENCOUNTER — Ambulatory Visit: Payer: BC Managed Care – PPO

## 2022-01-20 DIAGNOSIS — I10 Essential (primary) hypertension: Secondary | ICD-10-CM

## 2022-01-20 MED ORDER — SPIRONOLACTONE 100 MG PO TABS: 100.0000 mg | ORAL_TABLET | Freq: Every day | ORAL | 0 refills | Status: DC

## 2022-01-20 NOTE — Progress Notes (Signed)
Pt was advised by Dr Einar Pheasant to make a NV to have her blood pressure checked along side of the pt's cuff. She states she just took her medication.  I checked it and got 130/100.  Pt checked with her wrist cuff and got 142/94.  She denies headache, chest pain, jaw pain, or nausea.   I told her soeone would contact her soon with Dr Verda Cumins suggestions.

## 2022-01-20 NOTE — Addendum Note (Signed)
Addended by: Lesleigh Noe on: 01/20/2022 02:15 PM   Modules accepted: Orders

## 2022-01-21 ENCOUNTER — Other Ambulatory Visit: Payer: Self-pay | Admitting: Family Medicine

## 2022-01-24 ENCOUNTER — Encounter: Payer: Self-pay | Admitting: Family Medicine

## 2022-01-25 MED ORDER — WEGOVY 0.25 MG/0.5ML ~~LOC~~ SOAJ: 0.2500 mg | SUBCUTANEOUS | 0 refills | Status: DC

## 2022-03-02 ENCOUNTER — Other Ambulatory Visit: Payer: Self-pay | Admitting: Family Medicine

## 2022-03-02 DIAGNOSIS — G4726 Circadian rhythm sleep disorder, shift work type: Secondary | ICD-10-CM

## 2022-03-03 MED ORDER — ZOLPIDEM TARTRATE ER 6.25 MG PO TBCR: 6.2500 mg | EXTENDED_RELEASE_TABLET | Freq: Every evening | ORAL | 0 refills | Status: DC | PRN

## 2022-03-03 MED ORDER — CYCLOBENZAPRINE HCL 10 MG PO TABS: 10.0000 mg | ORAL_TABLET | Freq: Three times a day (TID) | ORAL | 0 refills | Status: DC | PRN

## 2022-03-31 ENCOUNTER — Other Ambulatory Visit: Payer: Self-pay | Admitting: Family Medicine

## 2022-03-31 DIAGNOSIS — I10 Essential (primary) hypertension: Secondary | ICD-10-CM

## 2022-04-17 ENCOUNTER — Other Ambulatory Visit: Payer: Self-pay | Admitting: Family Medicine

## 2022-04-17 DIAGNOSIS — I10 Essential (primary) hypertension: Secondary | ICD-10-CM

## 2022-04-21 ENCOUNTER — Ambulatory Visit: Payer: BC Managed Care – PPO | Admitting: Family Medicine

## 2022-04-21 DIAGNOSIS — I10 Essential (primary) hypertension: Secondary | ICD-10-CM

## 2022-04-28 ENCOUNTER — Ambulatory Visit (INDEPENDENT_AMBULATORY_CARE_PROVIDER_SITE_OTHER): Payer: BC Managed Care – PPO | Admitting: Family Medicine

## 2022-04-28 ENCOUNTER — Encounter: Payer: Self-pay | Admitting: Family Medicine

## 2022-04-28 VITALS — BP 140/102 | HR 73 | Temp 97.8°F | Ht 69.0 in | Wt 331.0 lb

## 2022-04-28 DIAGNOSIS — R400 Somnolence: Secondary | ICD-10-CM

## 2022-04-28 DIAGNOSIS — Z23 Encounter for immunization: Secondary | ICD-10-CM

## 2022-04-28 DIAGNOSIS — R0683 Snoring: Secondary | ICD-10-CM | POA: Diagnosis not present

## 2022-04-28 DIAGNOSIS — I10 Essential (primary) hypertension: Secondary | ICD-10-CM

## 2022-04-28 DIAGNOSIS — E66813 Obesity, class 3: Secondary | ICD-10-CM

## 2022-04-28 MED ORDER — HYDRALAZINE HCL 25 MG PO TABS: 25.0000 mg | ORAL_TABLET | Freq: Three times a day (TID) | ORAL | 1 refills | Status: DC

## 2022-04-28 MED ORDER — WEGOVY 0.25 MG/0.5ML ~~LOC~~ SOAJ: 0.2500 mg | SUBCUTANEOUS | 0 refills | Status: DC

## 2022-04-28 NOTE — Assessment & Plan Note (Signed)
She is high risk for sleep apnea, referral to pulm.  She also has an Epworth of 10.

## 2022-04-28 NOTE — Assessment & Plan Note (Signed)
Courage trying to follow a healthy diet, exercise limited secondary to chronic knee pain.  Paper prescription for [facility] provided advised checking with pharmacies for availability again.  Referral to healthy weight and wellness number provided.

## 2022-04-28 NOTE — Assessment & Plan Note (Signed)
Poorly controlled.  Patient is still hesitant to try an ACE or an ARB due to risk for angioedema.  Continue amlodipine 10 mg, spironolactone 100 mg, start hydralazine 25 mg 3 times daily.  She has failed metoprolol and hydrochlorothiazide due to side effects.  Referral to hypertension clinic in the event that she is not controlled on hydralazine.  Referral to pulmonology for evaluation for sleep apnea.

## 2022-04-28 NOTE — Progress Notes (Signed)
Subjective:     [name] is a 46 y.o. female presenting for Hypertension (Checks periodically at home. Says it is usually high.) and Weight (Nutrition never called to set up appt. Did not start Wegovy.)     Hypertension    #HTN - taking amlodipine 10 mg, spironolactone 100 mg - failed hctz - cramping and electrolyte issues - diet: not healthy - exercise: not with bad knee pain  Feeling tired  Feels like she needs naps Snores No one has said she stops breathing Wakes up feeling tired   #knee pain - had fluid taken off    Review of Systems  12/31/2021: Clinic - HTN - spironolactone to 50 mg. Cont amlodpine.  01/20/2022: Nurse - increase spironolactone to 100 mg  Social History   Tobacco Use  Smoking Status Never   Passive exposure: Never  Smokeless Tobacco Never        Objective:    BP Readings from Last 3 Encounters:  04/28/22 (!) 140/102  01/20/22 (!) 142/94  12/31/21 (!) 132/94   Wt Readings from Last 3 Encounters:  04/28/22 (!) 331 lb (150.1 kg)  12/31/21 (!) 307 lb 6 oz (139.4 kg)  09/30/21 (!) 321 lb (145.6 kg)    BP (!) 140/102 (BP Location: Left Arm, Cuff Size: Large)   Pulse 73   Temp 97.8 F (36.6 C)   Ht '5\' 9"'$  (1.753 m)   Wt (!) 331 lb (150.1 kg)   SpO2 90%   BMI 48.88 kg/m    Physical Exam Constitutional:      General: She is not in acute distress.    Appearance: She is well-developed. She is not diaphoretic.  HENT:     Head: Normocephalic and atraumatic.     Right Ear: External ear normal.     Left Ear: External ear normal.     Nose: Nose normal.  Eyes:     General: No scleral icterus.    Extraocular Movements: Extraocular movements intact.     Conjunctiva/sclera: Conjunctivae normal.  Cardiovascular:     Rate and Rhythm: Normal rate and regular rhythm.     Heart sounds: No murmur heard. Pulmonary:     Effort: Pulmonary effort is normal. No respiratory distress.     Breath sounds: Normal breath sounds. No  wheezing.  Abdominal:     General: Bowel sounds are normal. There is no distension.     Palpations: Abdomen is soft. There is no mass.     Tenderness: There is no abdominal tenderness. There is no guarding or rebound.  Musculoskeletal:        General: Normal range of motion.     Cervical back: Neck supple.  Lymphadenopathy:     Cervical: No cervical adenopathy.  Skin:    General: Skin is warm and dry.     Capillary Refill: Capillary refill takes less than 2 seconds.  Neurological:     Mental Status: She is alert and oriented to person, place, and time.     Deep Tendon Reflexes: Reflexes normal.  Psychiatric:        Mood and Affect: Mood normal.        Behavior: Behavior normal.           Assessment & Plan:   Problem List Items Addressed This Visit       Cardiovascular and Mediastinum   Essential hypertension - Primary    Poorly controlled.  Patient is still hesitant to try an ACE or an ARB  due to risk for angioedema.  Continue amlodipine 10 mg, spironolactone 100 mg, start hydralazine 25 mg 3 times daily.  She has failed metoprolol and hydrochlorothiazide due to side effects.  Referral to hypertension clinic in the event that she is not controlled on hydralazine.  Referral to pulmonology for evaluation for sleep apnea.      Relevant Medications   hydrALAZINE (APRESOLINE) 25 MG tablet   Semaglutide-Weight Management (WEGOVY) 0.25 MG/0.5ML SOAJ   Other Relevant Orders   Ambulatory referral to [facility]   Ambulatory referral to Pulmonology     Other   Obesity, Class III, BMI 40-49.9 (morbid obesity) ([place])    Courage trying to follow a healthy diet, exercise limited secondary to chronic knee pain.  Paper prescription for [facility] provided advised checking with pharmacies for availability again.  Referral to healthy weight and wellness number provided.      Relevant Medications   Semaglutide-Weight Management (WEGOVY) 0.25 MG/0.5ML SOAJ    Other Relevant Orders   Ambulatory referral to [facility]   Snoring    She is high risk for sleep apnea, referral to pulm.  She also has an Epworth of 10.      Relevant Orders   Ambulatory referral to Pulmonology   Other Visit Diagnoses     Daytime sleepiness       Relevant Orders   Ambulatory referral to Pulmonology   Need for influenza vaccination       Relevant Orders   Flu Vaccine QUAD 6+ mos PF IM (Fluarix Quad PF) (Completed)        Return in about 6 weeks (around 06/09/2022) for TOC with Tabitha for HTN and weight .  Lesleigh Noe, MD

## 2022-04-28 NOTE — Patient Instructions (Addendum)
Blood pressure - continue spironolactone and amlodipine - start Hydralazine 25 mg three times daily    Referrals - phone numbers below - call healthy weight - call Hypertension and Pulmonology if no call in 1 week  Healthy weight and wellness Address: [address], [place], [place] [address] Hours:  Open ? Closes 5?PM Updated by this business 5 weeks ago Phone: [phone]  [facility], contact us at [phone]  [facility]  [address] [place], [place]: [phone]  Your blood pressure high.   High blood pressure increases your risk for heart attack and stroke.    Please check your blood pressure 2-4 times a week.   To check your blood pressure 1) Sit in a quiet and relaxed place for 5 minutes 2) Make sure your feet are flat on the ground 3) Consider checking first thing in the morning   Normal blood pressure is less than 140/90 Ideally you blood pressure should be around 120/80  Other ways you can reduce your blood pressure:  1) Regular exercise -- Try to get 150 minutes (30 minutes, 5 days a week) of moderate to vigorous aerobic excercise -- Examples: brisk walking (2.5 miles per hour), water aerobics, dancing, gardening, tennis, biking slower than 10 miles per hour 2) DASH Diet - low fat meats, more fresh fruits and vegetables, whole grains, low salt 3) Quit smoking if you smoke 4) Loose 5-10% of your body weight

## 2022-04-29 ENCOUNTER — Ambulatory Visit: Payer: BC Managed Care – PPO | Admitting: Orthopaedic Surgery

## 2022-05-03 ENCOUNTER — Other Ambulatory Visit: Payer: Self-pay | Admitting: Family Medicine

## 2022-05-03 ENCOUNTER — Encounter: Payer: Self-pay | Admitting: Family Medicine

## 2022-05-03 DIAGNOSIS — E66813 Obesity, class 3: Secondary | ICD-10-CM

## 2022-05-04 MED ORDER — CYCLOBENZAPRINE HCL 10 MG PO TABS: 10.0000 mg | ORAL_TABLET | Freq: Three times a day (TID) | ORAL | 0 refills | Status: DC | PRN

## 2022-05-04 MED ORDER — IBUPROFEN 800 MG PO TABS: 800.0000 mg | ORAL_TABLET | Freq: Three times a day (TID) | ORAL | 0 refills | Status: DC | PRN

## 2022-05-17 ENCOUNTER — Encounter: Payer: Self-pay | Admitting: Cardiovascular Disease

## 2022-05-20 NOTE — Progress Notes (Unsigned)
Office Visit Note  Patient: [name]             Date of Birth: 1975-12-08           MRN: [id number]             PCP: Waunita Schooner, MD Referring: Waunita Schooner, MD Visit Date: 05/24/2022 Occupation: '@GUAROCC' @  Subjective:  Pain in joints and muscle cramps  History of Present Illness: [name] is a 46 y.o. female seen in consultation per request of Dr. Einar Pheasant for evaluation of elevated CK.  According the patient she elevated CK in 2021.  For the last 3 to 4 years she has been having increased muscle cramps.  She states muscle cramps can happen anytime if she is active or not active and even at nighttime.  She describes muscle cramps in all of her extremities and her trunk and also in her tongue.  She states the muscle cramps could be severe.  She was tried on hydralazine which made her muscle cramps worst and she stopped hydralazine.  She has tried Flexeril which does not help much.  She also gives history of chronic insomnia for many years.  She had right knee joint meniscal tear repair several years ago.  She has chronic discomfort in her bilateral knee joints.  She states this year she has had several episodes of truly joint swelling more prominent in the left knee joint.  She has been followed at [facility].  She states she had left knee joint aspiration on September 29.  She also was given a cortisone injection.  Been on Celebrex 200 mg twice a day since then.  She has not noticed much difference on Celebrex.  She has ongoing discomfort and is scheduled to have MRI of her left knee joint.  She also describes discomfort in her shoulders, hips, ankles.  She has chronic lower back pain.  None of the other joints are swollen.  She notices fullness in her ankles.  Is no history of oral ulcers, nasal ulcers, sicca symptoms, malar rash, photosensitivity, hair loss, Raynaud's phenomenon or lymphadenopathy.  She has been noticing increased shortness of breath.  She reports  difficulty climbing stairs and getting out of the chair due to joint discomfort.  There is no family history of autoimmune disease.  She is gravida 4, para 3, miscarriage 1.  There is no history of DVTs.  Activities of Daily Living:  Patient reports morning stiffness for all day. Patient Reports nocturnal pain.  Difficulty dressing/grooming: Reports Difficulty climbing stairs: Reports Difficulty getting out of chair: Reports Difficulty using hands for taps, buttons, cutlery, and/or writing: Denies  Review of Systems  Constitutional:  Positive for fatigue.  HENT:  Negative for mouth sores and mouth dryness.   Eyes:  Negative for dryness.  Respiratory:  Positive for shortness of breath.   Cardiovascular:  Negative for chest pain and palpitations.  Gastrointestinal:  Negative for blood in stool, constipation and diarrhea.  Endocrine: Negative for increased urination.  Genitourinary:  Negative for involuntary urination.  Musculoskeletal:  Positive for joint pain, gait problem, joint pain, joint swelling, myalgias, muscle weakness, morning stiffness, muscle tenderness and myalgias.  Skin:  Negative for color change, rash, hair loss and sensitivity to sunlight.  Allergic/Immunologic: Negative for susceptible to infections.  Neurological:  Negative for dizziness and headaches.  Hematological:  Negative for swollen glands.  Psychiatric/Behavioral:  Positive for sleep disturbance. Negative for depressed mood. The patient is not nervous/anxious.  PMFS History:  Patient Active Problem List   Diagnosis Date Noted   Snoring 04/28/2022   Murmur 12/31/2021   Proteinuria 12/31/2021   Dermatitis 04/13/2021   Muscle cramps 02/27/2020   Atypical chest pain 03/08/2019   Essential hypertension 08/14/2018   Obesity, Class III, BMI 40-49.9 (morbid obesity) ([place]) 08/14/2018    Past Medical History:  Diagnosis Date   Hypertension    Murmur     Family History  Problem Relation Age of Onset    Diabetes Mother    Hypertension Mother    Diabetes Father    Hypertension Father    Hypertension Sister    Autism Sister    Healthy Son    Healthy Daughter    Healthy Daughter    Past Surgical History:  Procedure Laterality Date   ABDOMINOPLASTY     ACHILLES TENDON SURGERY  06/08/2012   Procedure: ACHILLES TENDON REPAIR;  Surgeon: Newt Minion, MD;  Location: [place];  Service: Orthopedics;  Laterality: Right;  Right Achilles Reconstruction   APPENDECTOMY     CHOLECYSTECTOMY     HYSTEROSCOPY WITH D & C N/A 03/06/2014   Procedure: DILATATION AND CURETTAGE With IUD Removal;  Surgeon: Marvene Staff, MD;  Location: [facility];  Service: Gynecology;  Laterality: N/A;   KNEE SURGERY     right knee arthroscropic   Social History   Social History Narrative   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 [employer]   3 kids - ages 34 - 47   Husband - Eddie Dibbles   Enjoys: watching her kids play sports, spending time with husband   Exercise: not regular now - hoping to get back to it   Diet: not get currently, but hoping to do better   Immunization History  Administered Date(s) Administered   DTaP 07/12/1977, 10/10/1977, 12/15/1977, 12/06/1978, 04/02/1982   Hepatitis A 05/13/2014, 06/13/2014   Hepatitis A, Ped/Adol-2 Dose 01/19/2015   Hepatitis B 05/13/2014, 06/13/2014, 01/19/2015   IPV 07/12/1977, 10/10/1977, 12/15/1977, 04/02/1982   Influenza,inj,Quad PF,6+ Mos 04/28/2022   MMR 07/17/1978, 12/06/1978   PFIZER(Purple Top)SARS-COV-2 Vaccination 09/24/2019, 10/14/2019, 06/10/2020   Td 03/07/1995   Tdap 06/24/2012   Varicella 05/13/2014, 06/13/2014     Objective: Vital Signs: BP (!) 144/95 (BP Location: Right Arm, Patient Position: Sitting, Cuff Size: Large)   Pulse 74   Resp 17   Ht '5\' 9"'  (1.753 m)   Wt (!) 338 lb 9.6 oz (153.6 kg)   BMI 50.00 kg/m    Physical Exam Vitals and nursing note reviewed.  Constitutional:      Appearance: She is  well-developed.  HENT:     Head: Normocephalic and atraumatic.  Eyes:     Conjunctiva/sclera: Conjunctivae normal.  Cardiovascular:     Rate and Rhythm: Normal rate and regular rhythm.     Heart sounds: Normal heart sounds.  Pulmonary:     Effort: Pulmonary effort is normal.     Breath sounds: Normal breath sounds.  Abdominal:     General: Bowel sounds are normal.     Palpations: Abdomen is soft.  Musculoskeletal:     Cervical back: Normal range of motion.  Lymphadenopathy:     Cervical: No cervical adenopathy.  Skin:    General: Skin is warm and dry.     Capillary Refill: Capillary refill takes less than 2 seconds.  Neurological:     Mental Status: She is alert and oriented to person, place, and time.  Psychiatric:        Behavior: Behavior normal.      Musculoskeletal Exam: C-spine was in good range of motion.  She had no discomfort over thoracic or lumbar region.  She had tenderness over bilateral SI joints.  Shoulder joints and elbow joints with good range of motion.  CMC, PIP and DIP were in good range of motion with no synovitis.  She good range of motion of bilateral hip joints with tenderness over trochanteric region.  She had discomfort range of motion of bilateral knee joints and warmth was noted in the left knee joint with minimal effusion.  There was no tenderness over ankles or MTPs.  No swelling was noted.  CDAI Exam: CDAI Score: -- Patient Global: --; Provider Global: -- Swollen: --; Tender: -- Joint Exam 05/24/2022   No joint exam has been documented for this visit   There is currently no information documented on the homunculus. Go to the Rheumatology activity and complete the homunculus joint exam.  Investigation: No additional findings.  Imaging: XR Pelvis 1-2 Views  Result Date: 05/24/2022 Bilateral SI joints sclerosis was noted.  No SI joint narrowing or erosive changes were noted.  Bilateral hip joints showed no significant narrowing.  Degenerative  changes were noted in the lumbar spine. Impression: Mild SI joint sclerosis was noted most likely degenerative changes.   Recent Labs: Lab Results  Component Value Date   WBC 4.3 10/08/2021   HGB 13.0 10/08/2021   PLT 259.0 10/08/2021   NA 135 12/31/2021   K 4.2 12/31/2021   CL 106 12/31/2021   CO2 23 12/31/2021   GLUCOSE 99 12/31/2021   BUN 13 12/31/2021   CREATININE 0.95 12/31/2021   BILITOT 0.5 10/08/2021   ALKPHOS 50 10/08/2021   AST 25 10/08/2021   ALT 36 (H) 10/08/2021   PROT 7.3 10/08/2021   ALBUMIN 4.7 10/08/2021   CALCIUM 9.1 12/31/2021   GFRAA >60 08/09/2018    Speciality Comments: No specialty comments available.  Procedures:  No procedures performed Allergies: Beeswax   Assessment / Plan:     Visit Diagnoses: Elevated CK - 10/08/21: CK 364 -patient gives history of muscle cramps for at least 3 to 4 years.  Her CK was elevated in 2021.  Her CK is a still mildly elevated.  She gives history of muscle spasms in her tongue, extremities and torso.  I will obtain additional labs and will refer her to neurology for further evaluation.  Plan: CK, Aldolase, 3-Hydroxy-3-Methylglutaryl-Coenzyme A Reductase (HMGCR) AB (IgG), Myositis Specific II Antibodies Panel  Muscle cramping - Plan: Magnesium  Chronic pain of both shoulders -she complains of pain and discomfort in her bilateral shoulders for several years.  I reviewed records in epic everywhere.  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  She had good range of motion of bilateral shoulder joints today.  Chronic pain of both hips-she can planes of pain and discomfort in her bilateral hips.  Both hips joints were in good range of motion.  She had tenderness over bilateral trochanteric bursa consistent with trochanteric bursitis.  Chronic pain of both knees - Status post right knee joint meniscal tear repair many years ago. -She complains of chronic pain and discomfort in her knee joints.   She states for the last several months she has been having pain and swelling in her  bilateral knees.  She had left knee joint aspiration on September 29 at [facility].  She was also placed  on Celebrex 200 mg p.o. twice daily.  She states she has a MRI scheduled for her left knee joint.  Synovial fluid was not analyzed.  I advised her to get synovial fluid analysis for future aspirations.  I will obtain additional labs today.  Plan: Uric acid, Sedimentation rate, Rheumatoid factor, Cyclic citrul peptide antibody, IgG, ANA, Angiotensin converting enzyme  Effusion, left knee-she has minimal effusion on palpation today.  She also had warmth in her left knee joint.  She had aspiration by Raliegh Ip.  Pain in joint involving ankle and foot, unspecified laterality-she complains of intermittent swelling in her ankles and her feet.  No warmth swelling or effusion was noted today on the examination.  Chronic SI joint pain -she complains of discomfort in her SI joints for many years.  She had tenderness on palpation.  Plan: HLA-B27 antigen, XR Pelvis 1-2 Views  Family history of psoriasis and sister  Essential hypertension-blood pressure was elevated today.  She is on spironolactone and amlodipine.  She was on hydralazine which she stopped as she thought it caused her muscle spasms to get worse.  Murmur-patient states she has a benign heart murmur for many years.  Primary insomnia-she can history of chronic insomnia for many years.  Other fatigue -she gives history of increased fatigue.  I will obtain following labs.  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  She gets serial chest x-rays at work. - Plan: QuantiFERON-TB Gold Plus  Vitamin D deficiency - Plan: VITAMIN D 25 Hydroxy (Vit-D Deficiency, Fractures)  Orders: Orders Placed This Encounter  Procedures   XR Pelvis 1-2  Views   COMPLETE METABOLIC PANEL WITH GFR   CBC with Differential/Platelet   CK   TSH   Uric acid   Sedimentation rate   Magnesium   Rheumatoid factor   Cyclic citrul peptide antibody, IgG   ANA   Angiotensin converting enzyme   HLA-B27 antigen   Aldolase   3-Hydroxy-3-Methylglutaryl-Coenzyme A Reductase (HMGCR) AB (IgG)   Myositis Specific II Antibodies Panel   VITAMIN D 25 Hydroxy (Vit-D Deficiency, Fractures)   QuantiFERON-TB Gold Plus   Serum protein electrophoresis with reflex   Ambulatory referral to Neurology   No orders of the defined types were placed in this encounter.    Follow-Up Instructions: Return for Elevated CK, pain in multiple joints.   Bo Merino, MD  Note - This record has been created using Editor, commissioning.  Chart creation errors have been sought, but may not always  have been located. Such creation errors do not reflect on  the standard of medical care.

## 2022-05-24 ENCOUNTER — Encounter: Payer: Self-pay | Admitting: Rheumatology

## 2022-05-24 ENCOUNTER — Ambulatory Visit: Payer: BC Managed Care – PPO | Attending: Rheumatology | Admitting: Rheumatology

## 2022-05-24 ENCOUNTER — Ambulatory Visit (INDEPENDENT_AMBULATORY_CARE_PROVIDER_SITE_OTHER): Payer: BC Managed Care – PPO

## 2022-05-24 VITALS — BP 144/95 | HR 74 | Resp 17 | Ht 69.0 in | Wt 338.6 lb

## 2022-05-24 DIAGNOSIS — R5383 Other fatigue: Secondary | ICD-10-CM

## 2022-05-24 DIAGNOSIS — R748 Abnormal levels of other serum enzymes: Secondary | ICD-10-CM | POA: Diagnosis not present

## 2022-05-24 DIAGNOSIS — R252 Cramp and spasm: Secondary | ICD-10-CM | POA: Diagnosis not present

## 2022-05-24 DIAGNOSIS — M25551 Pain in right hip: Secondary | ICD-10-CM

## 2022-05-24 DIAGNOSIS — R809 Proteinuria, unspecified: Secondary | ICD-10-CM

## 2022-05-24 DIAGNOSIS — M533 Sacrococcygeal disorders, not elsewhere classified: Secondary | ICD-10-CM

## 2022-05-24 DIAGNOSIS — G8929 Other chronic pain: Secondary | ICD-10-CM

## 2022-05-24 DIAGNOSIS — Z840 Family history of diseases of the skin and subcutaneous tissue: Secondary | ICD-10-CM

## 2022-05-24 DIAGNOSIS — M25511 Pain in right shoulder: Secondary | ICD-10-CM | POA: Diagnosis not present

## 2022-05-24 DIAGNOSIS — I10 Essential (primary) hypertension: Secondary | ICD-10-CM

## 2022-05-24 DIAGNOSIS — L309 Dermatitis, unspecified: Secondary | ICD-10-CM

## 2022-05-24 DIAGNOSIS — E559 Vitamin D deficiency, unspecified: Secondary | ICD-10-CM

## 2022-05-24 DIAGNOSIS — M25561 Pain in right knee: Secondary | ICD-10-CM

## 2022-05-24 DIAGNOSIS — F5101 Primary insomnia: Secondary | ICD-10-CM

## 2022-05-24 DIAGNOSIS — M25512 Pain in left shoulder: Secondary | ICD-10-CM

## 2022-05-24 DIAGNOSIS — R7611 Nonspecific reaction to tuberculin skin test without active tuberculosis: Secondary | ICD-10-CM

## 2022-05-24 DIAGNOSIS — M25562 Pain in left knee: Secondary | ICD-10-CM

## 2022-05-24 DIAGNOSIS — M25552 Pain in left hip: Secondary | ICD-10-CM

## 2022-05-24 DIAGNOSIS — R011 Cardiac murmur, unspecified: Secondary | ICD-10-CM

## 2022-05-24 DIAGNOSIS — M25462 Effusion, left knee: Secondary | ICD-10-CM

## 2022-05-24 DIAGNOSIS — M25579 Pain in unspecified ankle and joints of unspecified foot: Secondary | ICD-10-CM

## 2022-05-25 ENCOUNTER — Other Ambulatory Visit: Payer: Self-pay | Admitting: *Deleted

## 2022-05-25 DIAGNOSIS — E559 Vitamin D deficiency, unspecified: Secondary | ICD-10-CM

## 2022-05-25 MED ORDER — VITAMIN D (ERGOCALCIFEROL) 1.25 MG (50000 UNIT) PO CAPS: ORAL_CAPSULE | ORAL | 0 refills | Status: DC

## 2022-05-25 NOTE — Progress Notes (Signed)
Vitamin D is low at 10.  Please call in vitamin D 50,000 units twice a week for 3 months.  Repeat vitamin D level in 3 months.  Patient should stay on vitamin D 2000 units over-the-counter after finishing the course of vitamin D.  We will discuss labs at the follow-up visit.

## 2022-05-26 ENCOUNTER — Ambulatory Visit (INDEPENDENT_AMBULATORY_CARE_PROVIDER_SITE_OTHER): Payer: BC Managed Care – PPO | Admitting: Internal Medicine

## 2022-05-26 ENCOUNTER — Encounter (INDEPENDENT_AMBULATORY_CARE_PROVIDER_SITE_OTHER): Payer: Self-pay | Admitting: Internal Medicine

## 2022-05-26 VITALS — BP 140/104 | HR 99 | Temp 99.2°F | Ht 69.0 in | Wt 325.2 lb

## 2022-05-26 DIAGNOSIS — I10 Essential (primary) hypertension: Secondary | ICD-10-CM

## 2022-05-26 DIAGNOSIS — Z6841 Body Mass Index (BMI) 40.0 and over, adult: Secondary | ICD-10-CM | POA: Diagnosis not present

## 2022-05-26 DIAGNOSIS — R7303 Prediabetes: Secondary | ICD-10-CM

## 2022-05-26 NOTE — Progress Notes (Signed)
Office: [phone]  /  Fax: [phone]   Initial Visit  [name] was seen in clinic today to evaluate for obesity. She is interested in losing weight to improve overall health and reduce the risk of weight related complications. She presents today to review program treatment options, initial physical assessment, and evaluation.     She was referred by: PCP  When asked what else they would like to accomplish? She states: Adopt healthier eating patterns, Improve existing medical conditions, Reduce number of medications, and Improve quality of life  When asked how has your weight affected you? She states: Contributed to medical problems, Contributed to orthopedic problems or mobility issues, Having fatigue, Having poor endurance, and Problems with eating patterns  Some associated conditions: Hypertension, Hyperlipidemia, and Prediabetes  Contributing factors: Family history, Disruption of circadian rhythm, Nutritional, Stress, Reduced physical activity, and Eating patterns  Weight promoting medications identified: None  Current nutrition plan: None  Current level of physical activity: None  Current or previous pharmacotherapy: GLP-1  Response to medication: Other: Not available so never started   Past medical history includes:   Past Medical History:  Diagnosis Date   Hypertension    Murmur      Objective:   BP (!) 140/104 Comment: manually  Pulse 99   Temp 99.2 F (37.3 C)   Ht '5\' 9"'$  (1.753 m)   Wt (!) 325 lb 3.2 oz (147.5 kg)   SpO2 99%   BMI 48.02 kg/m  She was weighed on the bioimpedance scale: Body mass index is 48.02 kg/m.  Peak Weight:325 ,Visceral Fat Rating:16, Body Fat%:47.9, Weight trend over the last 12 months: Increasing  General:  Alert, oriented and cooperative. Patient is in no acute distress.  Respiratory: Normal respiratory effort, no problems with respiration noted  Extremities: Normal range of motion.    Mental Status: Normal mood  and affect. Normal behavior. Normal judgment and thought content.   Assessment and Plan:  1. Class 3 severe obesity with serious comorbidity and body mass index (BMI) of 45.0 to 49.9 in adult, unspecified obesity type ([place]) We reviewed weight, biometrics, associated medical conditions and contributing factors with patient. She would benefit from weight loss therapy via a modified calorie, low-carb, high-protein nutritional plan tailored to their REE (resting energy expenditure) which will be determined by indirect calorimetry.  We will also assess for cardiometabolic risk and nutritional derangements via fasting serologies at her next appointment.   2. Essential hypertension Uncontrolled. I feel stress and anxiety maybe be contributing. On multiple medications.   3. Prediabetes A1c 5.8, counseled on disease state and risk of progression.  She will benefit from weight loss therapy.        Obesity Treatment / Action Plan:  Patient will work on garnering support from family and friends to begin weight loss journey. Will work on eliminating or reducing the presence of highly palatable, calorie dense foods in the home. Will complete provided nutritional and psychosocial assessment questionnaire before the next appointment. Will be scheduled for indirect calorimetry to determine resting energy expenditure in a fasting state.  This will allow [place] to create a reduced calorie, high-protein meal plan to promote loss of fat mass while preserving muscle mass. Will work on managing stress via relaxation methods as this may result in unhealthy eating patterns. Was counseled on nutritional approaches to weight loss and benefits of complex carbs and high quality protein as part of nutritional weight management. Was counseled on pharmacotherapy and role as an adjunct  in weight management.   Obesity Education Performed Today:  She was weighed on the bioimpedance scale and results were discussed and  documented in the synopsis.  We discussed obesity as a disease and the importance of a more detailed evaluation of all the factors contributing to the disease.  We discussed the importance of long term lifestyle changes which include nutrition, exercise and behavioral modifications as well as the importance of customizing this to her specific health and social needs.  We discussed the benefits of reaching a healthier weight to alleviate the symptoms of existing conditions and reduce the risks of the biomechanical, metabolic and psychological effects of obesity.  [name] appears to be in the action stage of change and states they are ready to start intensive lifestyle modifications and behavioral modifications.  30 minutes was spent today on this visit including the above counseling, pre-visit chart review, and post-visit documentation.  Reviewed by clinician on day of visit: allergies, medications, problem list, medical history, surgical history, family history, social history, and previous encounter notes.     I have reviewed the above documentation for accuracy and completeness, and I agree with the above.  Thomes Dinning, MD

## 2022-06-02 ENCOUNTER — Other Ambulatory Visit: Payer: Self-pay

## 2022-06-02 DIAGNOSIS — R778 Other specified abnormalities of plasma proteins: Secondary | ICD-10-CM

## 2022-06-02 DIAGNOSIS — I10 Essential (primary) hypertension: Secondary | ICD-10-CM

## 2022-06-02 DIAGNOSIS — R899 Unspecified abnormal finding in specimens from other organs, systems and tissues: Secondary | ICD-10-CM

## 2022-06-02 LAB — CBC WITH DIFFERENTIAL/PLATELET
Absolute Monocytes: 423 cells/uL (ref 200–950)
Basophils Absolute: 32 cells/uL (ref 0–200)
Basophils Relative: 0.7 %
Eosinophils Absolute: 131 cells/uL (ref 15–500)
Eosinophils Relative: 2.9 %
HCT: 40.7 % (ref 35.0–45.0)
Hemoglobin: 13.6 g/dL (ref 11.7–15.5)
Lymphs Abs: 1526 cells/uL (ref 850–3900)
MCH: 28.6 pg (ref 27.0–33.0)
MCHC: 33.4 g/dL (ref 32.0–36.0)
MCV: 85.5 fL (ref 80.0–100.0)
MPV: 10.6 fL (ref 7.5–12.5)
Monocytes Relative: 9.4 %
Neutro Abs: 2390 cells/uL (ref 1500–7800)
Neutrophils Relative %: 53.1 %
Platelets: 328 10*3/uL (ref 140–400)
RBC: 4.76 10*6/uL (ref 3.80–5.10)
RDW: 13.9 % (ref 11.0–15.0)
Total Lymphocyte: 33.9 %
WBC: 4.5 10*3/uL (ref 3.8–10.8)

## 2022-06-02 LAB — URIC ACID: Uric Acid, Serum: 4.7 mg/dL (ref 2.5–7.0)

## 2022-06-02 LAB — QUANTIFERON-TB GOLD PLUS
Mitogen-NIL: >10 IU/mL
NIL: 0.01 IU/mL
QuantiFERON-TB Gold Plus: NEGATIVE
TB1-NIL: 0.01 IU/mL
TB2-NIL: 0.01 IU/mL

## 2022-06-02 LAB — COMPLETE METABOLIC PANEL WITH GFR
AG Ratio: 1.6 (calc) (ref 1.0–2.5)
ALT: 46 U/L — ABNORMAL HIGH (ref 6–29)
AST: 27 U/L (ref 10–35)
Albumin: 4.4 g/dL (ref 3.6–5.1)
Alkaline phosphatase (APISO): 48 U/L (ref 31–125)
BUN: 13 mg/dL (ref 7–25)
CO2: 24 mmol/L (ref 20–32)
Calcium: 9.3 mg/dL (ref 8.6–10.2)
Chloride: 108 mmol/L (ref 98–110)
Creat: 0.94 mg/dL (ref 0.50–0.99)
Globulin: 2.7 g/dL (calc) (ref 1.9–3.7)
Glucose, Bld: 90 mg/dL (ref 65–99)
Potassium: 4.7 mmol/L (ref 3.5–5.3)
Sodium: 138 mmol/L (ref 135–146)
Total Bilirubin: 0.3 mg/dL (ref 0.2–1.2)
Total Protein: 7.1 g/dL (ref 6.1–8.1)
eGFR: 76 mL/min/{1.73_m2} (ref >=60)

## 2022-06-02 LAB — ANTI-NUCLEAR AB-TITER (ANA TITER): ANA Titer 1: 1:40 {titer} — ABNORMAL HIGH

## 2022-06-02 LAB — PROTEIN ELECTROPHORESIS, SERUM, WITH REFLEX
Abnormal Protein Band1: 0.4 g/dL — ABNORMAL HIGH
Albumin ELP: 4.2 g/dL (ref 3.8–4.8)
Alpha 1: 0.2 g/dL (ref 0.2–0.3)
Alpha 2: 0.7 g/dL (ref 0.5–0.9)
Beta 2: 0.3 g/dL (ref 0.2–0.5)
Beta Globulin: 0.4 g/dL (ref 0.4–0.6)
Gamma Globulin: 1.1 g/dL (ref 0.8–1.7)
Total Protein: 7 g/dL (ref 6.1–8.1)

## 2022-06-02 LAB — RHEUMATOID FACTOR: Rheumatoid fact SerPl-aCnc: <14 IU/mL (ref <14)

## 2022-06-02 LAB — MYOSITIS SPECIFIC II ANTIBODIES PANEL
EJ AB: <11 SI (ref <11)
JO-1 AB: <11 SI (ref <11)
MDA-5 AB: <11 SI (ref <11)
MI-2 ALPHA AB: <11 SI (ref <11)
MI-2 BETA AB: <11 SI (ref <11)
NXP-2 AB: <11 SI (ref <11)
OJ AB: <11 SI (ref <11)
PL-12 AB: <11 SI (ref <11)
PL-7 AB: <11 SI (ref <11)
SRP-AB: <11 SI (ref <11)
TIF-1y AB: <11 SI (ref <11)

## 2022-06-02 LAB — VITAMIN D 25 HYDROXY (VIT D DEFICIENCY, FRACTURES): Vit D, 25-Hydroxy: 10 ng/mL — ABNORMAL LOW (ref 30–100)

## 2022-06-02 LAB — HMGCR AB (IGG): HMGCR AB (IGG): <2 CU (ref <20)

## 2022-06-02 LAB — HLA-B27 ANTIGEN: HLA-B27 Antigen: NEGATIVE

## 2022-06-02 LAB — SEDIMENTATION RATE: Sed Rate: 2 mm/h (ref 0–20)

## 2022-06-02 LAB — IFE INTERPRETATION

## 2022-06-02 LAB — CK: Total CK: 288 U/L — ABNORMAL HIGH (ref 29–143)

## 2022-06-02 LAB — ANA: Anti Nuclear Antibody (ANA): POSITIVE — AB

## 2022-06-02 LAB — ALDOLASE: Aldolase: 8.2 U/L — ABNORMAL HIGH (ref <=8.1)

## 2022-06-02 LAB — CYCLIC CITRUL PEPTIDE ANTIBODY, IGG: Cyclic Citrullin Peptide Ab: <16 UNITS

## 2022-06-02 LAB — ANGIOTENSIN CONVERTING ENZYME: Angiotensin-Converting Enzyme: 56 U/L (ref 9–67)

## 2022-06-02 LAB — MAGNESIUM: Magnesium: 2.1 mg/dL (ref 1.5–2.5)

## 2022-06-02 LAB — TSH: TSH: 3.4 mIU/L

## 2022-06-02 NOTE — Progress Notes (Signed)
IFE abnormal.  Please refer patient to hematology for evaluation.  I will discuss other the results at the follow-up visit.  Please make sure that the patient has a follow-up appointment

## 2022-06-02 NOTE — Progress Notes (Signed)
Office Visit Note  Patient: [name]             Date of Birth: 1976-07-15           MRN: [id number]             PCP: Waunita Schooner, MD Referring: Waunita Schooner, MD Visit Date: 06/09/2022 Occupation: _0 @  Subjective:  Right knee pain  History of Present Illness: [name] is a 46 y.o. female with history of evaded CK, muscle cramps and joint pain.  She continues to have pain and discomfort in the bilateral knee joints.  She states she had left knee joint cortisone injection at [facility].  She will require surgery for meniscal tear repair in the future.  Her right knee joint has been hurting.  She states she had never had work-up for her right knee joint pain.  She continues to have some discomfort in her shoulders and trochanteric region which is manageable.  She continues to have muscle cramps.  She has an appointment coming up with the neurologist.  Activities of Daily Living:  Patient reports morning stiffness for all day. Patient Reports nocturnal pain.  Difficulty dressing/grooming: Denies Difficulty climbing stairs: Reports Difficulty getting out of chair: Reports Difficulty using hands for taps, buttons, cutlery, and/or writing: Denies  Review of Systems  Constitutional:  Positive for fatigue.  HENT:  Positive for mouth dryness. Negative for mouth sores.   Eyes:  Negative for dryness.  Respiratory:  Positive for shortness of breath.   Cardiovascular:  Negative for chest pain and palpitations.  Gastrointestinal:  Negative for blood in stool, constipation and diarrhea.  Endocrine: Negative for increased urination.  Genitourinary:  Negative for involuntary urination.  Musculoskeletal:  Positive for joint pain, gait problem, joint pain, joint swelling, myalgias, muscle weakness, morning stiffness, muscle tenderness and myalgias.  Skin:  Negative for color change, rash, hair loss and sensitivity to sunlight.  Allergic/Immunologic: Negative for  susceptible to infections.  Neurological:  Negative for dizziness and headaches.  Hematological:  Negative for swollen glands.  Psychiatric/Behavioral:  Positive for sleep disturbance. Negative for depressed mood. The patient is not nervous/anxious.     PMFS History:  Patient Active Problem List   Diagnosis Date Noted   Snoring 04/28/2022   Murmur 12/31/2021   Proteinuria 12/31/2021   Dermatitis 04/13/2021   Muscle cramps 02/27/2020   Atypical chest pain 03/08/2019   Essential hypertension 08/14/2018   Obesity, Class III, BMI 40-49.9 (morbid obesity) ([place]) 08/14/2018    Past Medical History:  Diagnosis Date   Hypertension    Murmur     Family History  Problem Relation Age of Onset   Diabetes Mother    Hypertension Mother    Diabetes Father    Hypertension Father    Hypertension Sister    Autism Sister    Healthy Son    Healthy Daughter    Healthy Daughter    Past Surgical History:  Procedure Laterality Date   ABDOMINOPLASTY     ACHILLES TENDON SURGERY  06/08/2012   Procedure: ACHILLES TENDON REPAIR;  Surgeon: Newt Minion, MD;  Location: [facility];  Service: Orthopedics;  Laterality: Right;  Right Achilles Reconstruction   APPENDECTOMY     CHOLECYSTECTOMY     HYSTEROSCOPY WITH D & C N/A 03/06/2014   Procedure: DILATATION AND CURETTAGE With IUD Removal;  Surgeon: Marvene Staff, MD;  Location: [facility];  Service: Gynecology;  Laterality: N/A;   KNEE SURGERY  right knee arthroscropic   Social History   Social History Narrative   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 [employer]   3 kids - ages 86 - 66   Husband - [name]   Enjoys: watching her kids play sports, spending time with husband   Exercise: not regular now - hoping to get back to it   Diet: not get currently, but hoping to do better   Immunization History  Administered Date(s) Administered   DTaP 07/12/1977, 10/10/1977, 12/15/1977, 12/06/1978, 04/02/1982   Hepatitis  A 05/13/2014, 06/13/2014   Hepatitis A, Ped/Adol-2 Dose 01/19/2015   Hepatitis B 05/13/2014, 06/13/2014, 01/19/2015   IPV 07/12/1977, 10/10/1977, 12/15/1977, 04/02/1982   Influenza,inj,Quad PF,6+ Mos 04/28/2022   MMR 07/17/1978, 12/06/1978   PFIZER(Purple Top)SARS-COV-2 Vaccination 09/24/2019, 10/14/2019, 06/10/2020   Td 03/07/1995   Tdap 06/24/2012   Varicella 05/13/2014, 06/13/2014     Objective: Vital Signs: BP (!) 158/103 (BP Location: Left Arm, Patient Position: Sitting, Cuff Size: Large)   Pulse (!) 106   Resp 16   Ht 5' 9" (1.753 m)   Wt (!) 328 lb 6.4 oz (149 kg)   BMI 48.50 kg/m    Physical Exam Vitals and nursing note reviewed.  Constitutional:      Appearance: She is well-developed.  HENT:     Head: Normocephalic and atraumatic.  Eyes:     Conjunctiva/sclera: Conjunctivae normal.  Cardiovascular:     Rate and Rhythm: Normal rate and regular rhythm.     Heart sounds: Normal heart sounds.  Pulmonary:     Effort: Pulmonary effort is normal.     Breath sounds: Normal breath sounds.  Abdominal:     General: Bowel sounds are normal.     Palpations: Abdomen is soft.  Musculoskeletal:     Cervical back: Normal range of motion.  Lymphadenopathy:     Cervical: No cervical adenopathy.  Skin:    General: Skin is warm and dry.     Capillary Refill: Capillary refill takes less than 2 seconds.  Neurological:     Mental Status: She is alert and oriented to person, place, and time.  Psychiatric:        Behavior: Behavior normal.      Musculoskeletal Exam: Cervical spine was in good range of motion.  Shoulder joints, elbow joints, wrist joints, MCPs PIPs and DIPs with good range of motion.  Hip joints with good range of motion.  Left knee joint was in a brace.  Right knee joint was in good range of motion without any warmth swelling or effusion.  There was no tenderness over ankles or MTPs.  CDAI Exam: CDAI Score: -- Patient Global: --; Provider Global: -- Swollen:  --; Tender: -- Joint Exam 06/09/2022   No joint exam has been documented for this visit   There is currently no information documented on the homunculus. Go to the Rheumatology activity and complete the homunculus joint exam.  Investigation: No additional findings.  Imaging: XR Pelvis 1-2 Views  Result Date: 05/24/2022 Bilateral SI joints sclerosis was noted.  No SI joint narrowing or erosive changes were noted.  Bilateral hip joints showed no significant narrowing.  Degenerative changes were noted in the lumbar spine. Impression: Mild SI joint sclerosis was noted most likely degenerative changes.   Recent Labs: Lab Results  Component Value Date   WBC 4.5 05/24/2022   HGB 13.6 05/24/2022   PLT 328 05/24/2022   NA 138 05/24/2022  K 4.7 05/24/2022   CL 108 05/24/2022   CO2 24 05/24/2022   GLUCOSE 90 05/24/2022   BUN 13 05/24/2022   CREATININE 0.94 05/24/2022   BILITOT 0.3 05/24/2022   ALKPHOS 50 10/08/2021   AST 27 05/24/2022   ALT 46 (H) 05/24/2022   PROT 7.1 05/24/2022   PROT 7.0 05/24/2022   ALBUMIN 4.7 10/08/2021   CALCIUM 9.3 05/24/2022   GFRAA >60 08/09/2018   QFTBGOLDPLUS NEGATIVE 05/24/2022   May 24, 2022 IFE IgG lambda monoclonal band present, vitamin D 10, TSH normal CK288, aldolase 8.2, HMG CR antibody negative, myositis panel negative, TB Gold negative, ANA 1: 40NS, uric acid 4.7, ESR 2, magnesium 2.1, RF negative, anti-CCP negative, ACE 56, HLA-B27 negative   Speciality Comments: No specialty comments available.  Procedures:  No procedures performed Allergies: Beeswax   Assessment / Plan:     Visit Diagnoses: Elevated CK - History of elevated CK for the last 4 years.  Myositis panel negative.  May 24, 2022 CK288, aldolase 8.2, HMG CR antibody negative, myositis panel negative.  Findings were discussed with the patient.  She will need EMG, nerve conduction velocity and neurology work-up.  She continues to have muscle cramps.  Patient was referred  to neurology.  Muscle cramping - History of muscle spasms in her tongue, extremities and torso per patient.  Chronic pain of both shoulders -she states that shoulder joint discomfort is manageable.  Previous x-rays showed left acromioclavicular arthritis.  Trochanteric bursitis of both hips-she continues to have some trochanteric bursa discomfort.  IT band stretches were discussed and a handout on IT band stretches was given.  Chronic pain of both knees - H/o bilateral meniscal tear.  Knee joint aspiration at [facility] in the past.  Patient states she had recent injection in the left knee joint.  She continues to have pain and discomfort in the right knee joint.  No warmth swelling or effusion was noted.- Plan: XR KNEE 3 VIEW RIGHT.  Moderate lateral compartment narrowing with lateral osteophytes and moderate patellofemoral narrowing was noted.  X-ray findings were reviewed with the patient.  A handout on lower extremity exercises was given.  Effusion, left knee - Minimal effusion was noted at the last visit.  Pain in joint involving ankle and foot, unspecified laterality - History of intermittent swelling of the ankles and her feet per patient.  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  Findings were discussed with the patient.  HLA-B27 negative.  Abnormal SPEP - Abnormal IFE.  Patient was referred to oncology.  Essential hypertension-her blood pressure was elevated at 158/103.  Patient states she is having difficulty controlling her blood pressure.  She is been referred to a cardiologist.  Other fatigue  Primary insomnia  Positive PPD - Since college per patient.  She gets serial chest x-rays at work.  TB Gold negative.  Vitamin D deficiency - Vitamin D 10.  Family history of psoriasis in sister  Orders: Orders Placed This Encounter  Procedures   XR KNEE 3 VIEW RIGHT   No orders of the defined types were placed in this  encounter.    Follow-Up Instructions: Return in about 3 months (around 09/09/2022) for elevated CK, OA.   Bo Merino, MD  Note - This record has been created using Editor, commissioning.  Chart creation errors have been sought, but may not always  have been located. Such creation errors do not reflect on  the  standard of medical care. 

## 2022-06-02 NOTE — Progress Notes (Signed)
IFE abnormal

## 2022-06-03 ENCOUNTER — Telehealth: Payer: Self-pay | Admitting: Hematology and Oncology

## 2022-06-03 NOTE — Telephone Encounter (Signed)
Scheduled appointment per 11/03. Patient is aware of appointment date and time. Patient is aware to arrive 15 mins prior to appointment time and to bring updated insurance cards. Patient is aware of location.

## 2022-06-09 ENCOUNTER — Other Ambulatory Visit: Payer: Self-pay | Admitting: Family Medicine

## 2022-06-09 ENCOUNTER — Ambulatory Visit (INDEPENDENT_AMBULATORY_CARE_PROVIDER_SITE_OTHER): Payer: BC Managed Care – PPO

## 2022-06-09 ENCOUNTER — Encounter: Payer: Self-pay | Admitting: Rheumatology

## 2022-06-09 ENCOUNTER — Ambulatory Visit: Payer: BC Managed Care – PPO | Attending: Rheumatology | Admitting: Rheumatology

## 2022-06-09 VITALS — BP 144/86 | HR 99 | Resp 16 | Ht 69.0 in | Wt 328.4 lb

## 2022-06-09 DIAGNOSIS — M533 Sacrococcygeal disorders, not elsewhere classified: Secondary | ICD-10-CM

## 2022-06-09 DIAGNOSIS — R748 Abnormal levels of other serum enzymes: Secondary | ICD-10-CM

## 2022-06-09 DIAGNOSIS — I10 Essential (primary) hypertension: Secondary | ICD-10-CM

## 2022-06-09 DIAGNOSIS — M7062 Trochanteric bursitis, left hip: Secondary | ICD-10-CM

## 2022-06-09 DIAGNOSIS — M25462 Effusion, left knee: Secondary | ICD-10-CM

## 2022-06-09 DIAGNOSIS — M7061 Trochanteric bursitis, right hip: Secondary | ICD-10-CM

## 2022-06-09 DIAGNOSIS — R252 Cramp and spasm: Secondary | ICD-10-CM | POA: Diagnosis not present

## 2022-06-09 DIAGNOSIS — Z840 Family history of diseases of the skin and subcutaneous tissue: Secondary | ICD-10-CM

## 2022-06-09 DIAGNOSIS — R778 Other specified abnormalities of plasma proteins: Secondary | ICD-10-CM

## 2022-06-09 DIAGNOSIS — F5101 Primary insomnia: Secondary | ICD-10-CM

## 2022-06-09 DIAGNOSIS — M25562 Pain in left knee: Secondary | ICD-10-CM

## 2022-06-09 DIAGNOSIS — M25511 Pain in right shoulder: Secondary | ICD-10-CM

## 2022-06-09 DIAGNOSIS — M25561 Pain in right knee: Secondary | ICD-10-CM

## 2022-06-09 DIAGNOSIS — G8929 Other chronic pain: Secondary | ICD-10-CM | POA: Diagnosis not present

## 2022-06-09 DIAGNOSIS — E559 Vitamin D deficiency, unspecified: Secondary | ICD-10-CM

## 2022-06-09 DIAGNOSIS — M25579 Pain in unspecified ankle and joints of unspecified foot: Secondary | ICD-10-CM

## 2022-06-09 DIAGNOSIS — M25512 Pain in left shoulder: Secondary | ICD-10-CM

## 2022-06-09 DIAGNOSIS — R5383 Other fatigue: Secondary | ICD-10-CM

## 2022-06-09 DIAGNOSIS — R7611 Nonspecific reaction to tuberculin skin test without active tuberculosis: Secondary | ICD-10-CM

## 2022-06-09 NOTE — Telephone Encounter (Signed)
  Encourage patient to contact the pharmacy for refills or they can request refills through [facility]  Did the patient contact the pharmacy: Yes  LAST APPOINTMENT DATE:  04/28/2022  NEXT APPOINTMENT DATE: 08/30/2022 (TOC)  MEDICATION: spironolactone (ALDACTONE) 100 MG tablet   Is the patient out of medication? Yes  PHARMACY: [id number] [place], [place] - [address]  Let patient know to contact pharmacy at the end of the day to make sure medication is ready.  Please notify patient to allow 48-72 hours to process

## 2022-06-09 NOTE — Patient Instructions (Signed)
Iliotibial Band Syndrome Rehab Ask your health care provider which exercises are safe for you. Do exercises exactly as told by your health care provider and adjust them as directed. It is normal to feel mild stretching, pulling, tightness, or discomfort as you do these exercises. Stop right away if you feel sudden pain or your pain gets significantly worse. Do not begin these exercises until told by your health care provider. Stretching and range-of-motion exercises These exercises warm up your muscles and joints and improve the movement and flexibility of your hip and pelvis. Quadriceps stretch, prone  Lie on your abdomen (prone position) on a firm surface, such as a bed or padded floor. Bend your left / right knee and reach back to hold your ankle or pant leg. If you cannot reach your ankle or pant leg, loop a belt around your foot and grab the belt instead. Gently pull your heel toward your buttocks. Your knee should not slide out to the side. You should feel a stretch in the front of your thigh and knee (quadriceps). Hold this position for __________ seconds. Repeat __________ times. Complete this exercise __________ times a day. Iliotibial band stretch An iliotibial band is a strong band of muscle tissue that runs from the outer side of your hip to the outer side of your thigh and knee. Lie on your side with your left / right leg in the top position. Bend both of your knees and grab your left / right ankle. Stretch out your bottom arm to help you balance. Slowly bring your top knee back so your thigh goes behind your trunk. Slowly lower your top leg toward the floor until you feel a gentle stretch on the outside of your left / right hip and thigh. If you do not feel a stretch and your knee will not fall farther, place the heel of your other foot on top of your knee and pull your knee down toward the floor with your foot. Hold this position for __________ seconds. Repeat __________ times.  Complete this exercise __________ times a day. Strengthening exercises These exercises build strength and endurance in your hip and pelvis. Endurance is the ability to use your muscles for a long time, even after they get tired. Straight leg raises, side-lying This exercise strengthens the muscles that rotate the leg at the hip and move it away from your body (hip abductors). Lie on your side with your left / right leg in the top position. Lie so your head, shoulder, hip, and knee line up. You may bend your bottom knee to help you balance. Roll your hips slightly forward so your hips are stacked directly over each other and your left / right knee is facing forward. Tense the muscles in your outer thigh and lift your top leg 4-6 inches (10-15 cm). Hold this position for __________ seconds. Slowly lower your leg to return to the starting position. Let your muscles relax completely before doing another repetition. Repeat __________ times. Complete this exercise __________ times a day. Leg raises, prone This exercise strengthens the muscles that move the hips backward (hip extensors). Lie on your abdomen (prone position) on your bed or a firm surface. You can put a pillow under your hips if that is more comfortable for your lower back. Bend your left / right knee so your foot is straight up in the air. Squeeze your buttocks muscles and lift your left / right thigh off the bed. Do not let your back arch. Tense   your thigh muscle as hard as you can without increasing any knee pain. Hold this position for __________ seconds. Slowly lower your leg to return to the starting position and allow it to relax completely. Repeat __________ times. Complete this exercise __________ times a day. Hip hike Stand sideways on a bottom step. Stand on your left / right leg with your other foot unsupported next to the step. You can hold on to a railing or wall for balance if needed. Keep your knees straight and your  torso square. Then lift your left / right hip up toward the ceiling. Slowly let your left / right hip lower toward the floor, past the starting position. Your foot should get closer to the floor. Do not lean or bend your knees. Repeat __________ times. Complete this exercise __________ times a day. This information is not intended to replace advice given to you by your health care provider. Make sure you discuss any questions you have with your health care provider. Document Revised: 09/25/2019 Document Reviewed: 09/25/2019 Elsevier Patient Education  [place]. Exercises for Chronic Knee Pain Chronic knee pain is pain that lasts longer than 3 months. For most people with chronic knee pain, exercise and weight loss is an important part of treatment. Your health care provider may want you to focus on: Strengthening the muscles that support your knee. This can take pressure off your knee and lessen pain. Preventing knee stiffness. Maintaining or increasing how far you can move your knee. Losing weight (if this applies) to take pressure off your knee, decrease your risk for injury, and make it easier for you to exercise. Your health care provider will help you develop an exercise program that matches your needs and physical abilities. Below are simple, low-impact exercises you can do at home. Ask your health care provider or a physical therapist how often you should do your exercise program and how many times to repeat each exercise. General safety tips Follow these safety tips for exercising with chronic knee pain: Get your health care provider's approval before doing any exercises. Start slowly and stop any time an exercise causes pain. Do not exercise if your knee pain is flaring up. Warm up first. Stretching a cold muscle can cause an injury. Do 5-10 minutes of easy movement or light stretching before beginning your exercise routine. Do 5-10 minutes of low-impact activity (like walking  or cycling) before starting strengthening exercises. Contact your health care provider any time you have pain during or after exercising. Exercise may cause discomfort but should not be painful. It is normal to be a little stiff or sore after exercising.  Stretching and range-of-motion exercises Front thigh stretch  Stand up straight and support your body by holding on to a chair or resting one hand on a wall. With your legs straight and close together, bend one knee to lift your heel up toward your buttocks. Using one hand for support, grab your ankle with your free hand. Pull your foot up closer toward your buttocks to feel the stretch in front of your thigh. Hold the stretch for 30 seconds. Repeat __________ times. Complete this exercise __________ times a day. Back thigh stretch  Sit on the floor with your back straight and your legs out straight in front of you. Place the palms of your hands on the floor and slide them toward your feet as you bend at the hip. Try to touch your nose to your knees and feel the stretch in the  back of your thighs. Hold for 30 seconds. Repeat __________ times. Complete this exercise __________ times a day. Calf stretch  Stand facing a wall. Place the palms of your hands flat against the wall, arms extended, and lean slightly against the wall. Get into a lunge position with one leg bent at the knee and the other leg stretched out straight behind you. Keep both feet facing the wall and increase the bend in your knee while keeping the heel of the other leg flat on the ground. You should feel the stretch in your calf. Hold for 30 seconds. Repeat __________ times. Complete this exercise __________ times a day. Strengthening exercises Straight leg lift Lie on your back with one knee bent and the other leg out straight. Slowly lift the straight leg without bending the knee. Lift until your foot is about 12 inches (30 cm) off the floor. Hold for 3-5 seconds  and slowly lower your leg. Repeat __________ times. Complete this exercise __________ times a day. Single leg dip Stand between two chairs and put both hands on the backs of the chairs for support. Extend one leg out straight with your body weight resting on the heel of the standing leg. Slowly bend your standing knee to dip your body to the level that is comfortable for you. Hold for 3-5 seconds. Repeat __________ times. Complete this exercise __________ times a day. Hamstring curls Stand straight, knees close together, facing the back of a chair. Hold on to the back of a chair with both hands. Keep one leg straight. Bend the other knee while bringing the heel up toward the buttock until the knee is bent at a 90-degree angle (right angle). Hold for 3-5 seconds. Repeat __________ times. Complete this exercise __________ times a day. Wall squat Stand straight with your back, hips, and head against a wall. Step forward one foot at a time with your back still against the wall. Your feet should be 2 feet (61 cm) from the wall at shoulder width. Keeping your back, hips, and head against the wall, slide down the wall to as close of a sitting position as you can get. Hold for 5-10 seconds, then slowly slide back up. Repeat __________ times. Complete this exercise __________ times a day. Step-ups Step up with one foot onto a sturdy platform or stool that is about 6 inches (15 cm) high. Face sideways with one foot on the platform and one on the ground. Place all your weight on the platform foot and lift your body off the ground until your knee extends. Let your other leg hang free to the side. Hold for 3-5 seconds then slowly lower your weight down to the floor foot. Repeat __________ times. Complete this exercise __________ times a day. Contact a health care provider if: Your exercise causes pain. Your pain is worse after you exercise. Your pain prevents you from doing your exercises. This  information is not intended to replace advice given to you by your health care provider. Make sure you discuss any questions you have with your health care provider. Document Revised: 11/21/2019 Document Reviewed: 07/15/2019 Elsevier Patient Education  [place].

## 2022-06-15 MED ORDER — SPIRONOLACTONE 100 MG PO TABS: 100.0000 mg | ORAL_TABLET | Freq: Every day | ORAL | 0 refills | Status: DC

## 2022-06-15 NOTE — Telephone Encounter (Signed)
Pt last saw Dr Einar Pheasant on 04/28/22 and pt was to return in 6 wks. Pt has new pt appt with [facility] on 06/22/22 and 08/30/2022 TOC with Eugenia Pancoast FNP.  Sending note to Dutch Quint FNP.

## 2022-06-15 NOTE — Addendum Note (Signed)
Addended by: Helene Shoe on: 06/15/2022 10:18 AM   Modules accepted: Orders

## 2022-06-22 ENCOUNTER — Ambulatory Visit (HOSPITAL_BASED_OUTPATIENT_CLINIC_OR_DEPARTMENT_OTHER): Payer: BC Managed Care – PPO | Admitting: Family

## 2022-06-22 NOTE — Progress Notes (Deleted)
Advanced Hypertension Clinic Assessment:    Date:  06/22/2022   ID:  [name], [name] [birth year], MRN [id number]  PCP:  Waunita Schooner, MD  Cardiologist:  None  Nephrologist:  Referring MD: Waunita Schooner, MD   CC: Hypertension  History of Present Illness:    [name] is a 46 y.o. female with a hx of hypertension, obesity, murmur (no prior echo on file) *** here to establish care in the Advanced Hypertension Clinic.   PCP notes she is hesitant to try ACE/ARB due to risk for angioedema. 04/28/22 Amlodipine '10mg'$ , Spironolactone '100mg'$  were contiued and Hydralazine '25mg'$  TID added. She was referred to pulmonology for sleep study.   Saw rheumatology 05/24/22. She was referred to neurology for muscle spasms in tongue, extremities, and torso. It was noted she had self discontinued her Hydralazine due to cramping, magnesium level was normal 2.1. Her IFE was abnormal and she was referred to hematology. ***  [name] was diagnosed with hypertension ***. It has been {Blank single:19197::"easy","difficult","***"} to control. Blood pressure {Blank single:19197::"not checked routinely at home","checked with arm cuff at home","checked with wrist cuff at home","***"}. Readings have been ***. she reports tobacco use ***. Alcohol use ***. For exercise she ***. Knee pain limits physical activity. she eats {Blank multiple:19196::"***","at home","outside of the home"} and {Blank single:19197::"does","does not"} follow low sodium diet.   Previous antihypertensives: HCTZ - cramping, electrolyte issues Metoprolol - did not tolerate Hydralazine - pt reported cramping Lisinopril - ***not clear if she actually took  Secondary Causes of Hypertension  Medications/Herbal: OCP, steroids, stimulants, antidepressants, weight loss medication, immune suppressants, NSAIDs, sympathomimetics, alcohol, caffeine, licorice, ginseng, St. John's wort, chemo  Sleep Apnea Renal artery  stenosis Hyperaldosteronism Hyper/hypothyroidism Pheochromocytoma: palpitations, tachycardia, headache, diaphoresis (plasma metanephrines) Cushing's syndrome: Cushingoid facies, central obesity, proximal muscle weakness, and ecchymoses, adrenal incidentaloma (cortisol) Coarctation of the aorta  Past Medical History:  Diagnosis Date   Hypertension    Murmur     Past Surgical History:  Procedure Laterality Date   ABDOMINOPLASTY     ACHILLES TENDON SURGERY  06/08/2012   Procedure: ACHILLES TENDON REPAIR;  Surgeon: Newt Minion, MD;  Location: [place];  Service: Orthopedics;  Laterality: Right;  Right Achilles Reconstruction   APPENDECTOMY     CHOLECYSTECTOMY     HYSTEROSCOPY WITH D & C N/A 03/06/2014   Procedure: DILATATION AND CURETTAGE With IUD Removal;  Surgeon: Marvene Staff, MD;  Location: [facility];  Service: Gynecology;  Laterality: N/A;   KNEE SURGERY     right knee arthroscropic    Current Medications: No outpatient medications have been marked as taking for the 06/22/22 encounter (Appointment) with Loel Dubonnet, NP.     Allergies:   Beeswax   Social History   Socioeconomic History   Marital status: Married    Spouse name: Eddie Dibbles   Number of children: 3   Years of education: Nursing school   Highest education level: Not on file  Occupational History   Not on file  Tobacco Use   Smoking status: Never    Passive exposure: Never   Smokeless tobacco: Never  Vaping Use   Vaping Use: Never used  Substance and Sexual Activity   Alcohol use: Yes    Comment: once a month, 1-2 servings   Drug use: No   Sexual activity: Yes    Birth control/protection: I.U.D.  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 [employer]   3 kids - ages 48 - 17   Husband - Eddie Dibbles   Enjoys: watching her kids play sports, spending time with husband   Exercise: not regular now - hoping to get  back to it   Diet: not get currently, but hoping to do better   Social Determinants of Health   Financial Resource Strain: Low Risk  (08/09/2018)   Overall Financial Resource Strain (CARDIA)    Difficulty of Paying Living Expenses: Not hard at all  Food Insecurity: Not on file  Transportation Needs: Not on file  Physical Activity: Not on file  Stress: Not on file  Social Connections: Not on file     Family History: The patient's ***family history includes Autism in her sister; Diabetes in her father and mother; Healthy in her daughter, daughter, and son; Hypertension in her father, mother, and sister.  ROS:   Please see the history of present illness.    *** All other systems reviewed and are negative.  EKGs/Labs/Other Studies Reviewed:    EKG:  EKG is *** ordered today.  The ekg ordered today demonstrates ***  Recent Labs: 05/24/2022: ALT 46; BUN 13; Creat 0.94; Hemoglobin 13.6; Magnesium 2.1; Platelets 328; Potassium 4.7; Sodium 138; TSH 3.40   Recent Lipid Panel    Component Value Date/Time   CHOL 188 10/08/2021 1025   TRIG 122.0 10/08/2021 1025   HDL 34.50 (L) 10/08/2021 1025   CHOLHDL 5 10/08/2021 1025   VLDL 24.4 10/08/2021 1025   LDLCALC 130 (H) 10/08/2021 1025    Physical Exam:   VS:  There were no vitals taken for this visit. , BMI There is no height or weight on file to calculate BMI. GENERAL:  Well appearing HEENT: Pupils equal round and reactive, fundi not visualized, oral mucosa unremarkable NECK:  No jugular venous distention, waveform within normal limits, carotid upstroke brisk and symmetric, no bruits, no thyromegaly LYMPHATICS:  No cervical adenopathy LUNGS:  Clear to auscultation bilaterally HEART:  RRR.  PMI not displaced or sustained,S1 and S2 within normal limits, no S3, no S4, no clicks, no rubs, *** murmurs ABD:  Flat, positive bowel sounds normal in frequency in pitch, no bruits, no rebound, no guarding, no midline pulsatile mass, no  hepatomegaly, no splenomegaly EXT:  2 plus pulses throughout, no edema, no cyanosis no clubbing SKIN:  No rashes no nodules NEURO:  Cranial nerves II through XII grossly intact, motor grossly intact throughout [facility]:  Cognitively intact, oriented to person place and time   ASSESSMENT/PLAN:    HTN -  05/2022 normal TSH  Screening for Secondary Hypertension: { Click here to document screening for secondary causes of HTN  :[id number]}    Relevant Labs/Studies:    Latest Ref Rng & Units 05/24/2022    8:55 AM 12/31/2021   10:18 AM 10/08/2021   10:25 AM  Basic Labs  Sodium 135 - 146 mmol/L 138  135  135   Potassium 3.5 - 5.3 mmol/L 4.7  4.2  4.1   Creatinine 0.50 - 0.99 mg/dL 0.94  0.95  1.10        Latest Ref Rng & Units 05/24/2022    8:55 AM 10/08/2021   10:25 AM  Thyroid   TSH mIU/L 3.40  3.73                     she consents to be monitored in our remote patient monitoring  program through [place].  she will track his blood pressure twice daily and understands that these trends will help [place] to adjust her medications as needed prior to his next appointment.  she *** interested in enrolling in the PREP exercise and nutrition program through the [facility].     Disposition:    FU with MD/PharmD in {gen number 2-03:559741} {Days to years:10300}    Medication Adjustments/Labs and Tests Ordered: Current medicines are reviewed at length with the patient today.  Concerns regarding medicines are outlined above.  No orders of the defined types were placed in this encounter.  No orders of the defined types were placed in this encounter.    Signed, Loel Dubonnet, NP  06/22/2022 10:05 AM    [place]

## 2022-06-25 NOTE — Progress Notes (Signed)
[place] [phone]   [phone] [place] NOTE  Patient Care Team: Waunita Schooner, MD as PCP - General (Family Medicine)  Hematological/Oncological History # IgG Lambda Monoclonal Gammopathy  05/24/2022: SPEP showed M protein 0.4 with IgG lambda specificity on IFE 06/27/2022: establish care with Dr. Lorenso Courier   CHIEF COMPLAINTS/PURPOSE OF CONSULTATION:  "Abnormal SPEP "  HISTORY OF PRESENTING ILLNESS:  [name] 46 y.o. female with medical history significant for hypertension and heart murmur who presents for evaluation of a newly diagnosed monoclonal gammopathy.  On review of the previous records [name] was evaluated by rheumatology on 05/24/2022.  She was being seen by rheumatology due to pain in her joints and muscle cramps.  At that time an SPEP was collected which showed an M protein of 0.4.  Subsequent IFE showed an IgG lambda monoclonal protein.  Due to concern for this finding the patient was referred to hematology for further evaluation and management.  On exam today [name] reports that she was originally seeing rheumatology because she was "cramping everywhere".  She reports that there was no clear cause.  As part of her evaluation she underwent an SPEP which showed her monoclonal protein.  She reports that her knees hurt bad but her legs hurt the most.  She reports that her energy is quite poor and she is "always tired".  She is always sleepy and currently ranks her energy as a 4 out of 10.  Her appetite has been okay and her bowels are moving well.  She is not having any trouble with nausea, vomiting, or diarrhea.  On further discussion she reports that her mother had type 2 diabetes and hypertension.  She has a maternal aunt with breast cancer in uncle who also passed away of cancer.  She has a sister who is healthy and 3 healthy children.  She is a never smoker and drinks approximately 1-2 alcoholic beverages per  month.  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  She reports that she is not having any bubbling or change in the color of her urine.  She is not having any fevers, chills, sweats.  A full 10 point ROS was otherwise negative.  MEDICAL HISTORY:  Past Medical History:  Diagnosis Date   Hypertension    Murmur     SURGICAL HISTORY: Past Surgical History:  Procedure Laterality Date   ABDOMINOPLASTY     ACHILLES TENDON SURGERY  06/08/2012   Procedure: ACHILLES TENDON REPAIR;  Surgeon: Newt Minion, MD;  Location: [place];  Service: Orthopedics;  Laterality: Right;  Right Achilles Reconstruction   APPENDECTOMY     CHOLECYSTECTOMY     HYSTEROSCOPY WITH D & C N/A 03/06/2014   Procedure: DILATATION AND CURETTAGE With IUD Removal;  Surgeon: Marvene Staff, MD;  Location: [facility];  Service: Gynecology;  Laterality: N/A;   KNEE SURGERY     right knee arthroscropic    SOCIAL HISTORY: Social History   Socioeconomic History   Marital status: Married    Spouse name: Eddie Dibbles   Number of children: 3   Years of education: Nursing school   Highest education level: Not on file  Occupational History   Not on file  Tobacco Use   Smoking status: Never    Passive exposure: Never   Smokeless tobacco: Never  Vaping Use   Vaping Use: Never used  Substance and Sexual Activity   Alcohol use: Yes    Comment: once  a month, 1-2 servings   Drug use: No   Sexual activity: Yes    Birth control/protection: I.U.D.  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 [employer]   3 kids - ages 73 - 74   Husband - Eddie Dibbles   Enjoys: watching her kids play sports, spending time with husband   Exercise: not regular now - hoping to get back to it   Diet: not get currently, but hoping to do better   Social Determinants of Health   Financial Resource Strain: Low Risk  (08/09/2018)   Overall Financial Resource Strain  (CARDIA)    Difficulty of Paying Living Expenses: Not hard at all  Food Insecurity: Not on file  Transportation Needs: Not on file  Physical Activity: Not on file  Stress: Not on file  Social Connections: Not on file  Intimate Partner Violence: Not on file    FAMILY HISTORY: Family History  Problem Relation Age of Onset   Diabetes Mother    Hypertension Mother    Diabetes Father    Hypertension Father    Hypertension Sister    Autism Sister    Healthy Son    Healthy Daughter    Healthy Daughter     ALLERGIES:  is allergic to beeswax.  MEDICATIONS:  Current Outpatient Medications  Medication Sig Dispense Refill   Vitamin D, Ergocalciferol, (DRISDOL) 1.25 MG (50000 UNIT) CAPS capsule Take one capsule by mouth  twice a week. 24 capsule 0   amLODipine (NORVASC) 10 MG tablet TAKE 1 TABLET BY MOUTH EVERY DAY 90 tablet 3   cyclobenzaprine (FLEXERIL) 10 MG tablet Take 1 tablet (10 mg total) by mouth 3 (three) times daily as needed for muscle spasms. 30 tablet 0   hydrALAZINE (APRESOLINE) 25 MG tablet Take 1 tablet (25 mg total) by mouth 3 (three) times daily. (Patient not taking: Reported on 05/24/2022) 90 tablet 1   ibuprofen (ADVIL) 800 MG tablet Take 1 tablet (800 mg total) by mouth every 8 (eight) hours as needed. 90 tablet 0   levonorgestrel (MIRENA) 20 MCG/24HR IUD 1 each by Intrauterine route once.     Semaglutide-Weight Management (WEGOVY) 0.25 MG/0.5ML SOAJ Inject 0.25 mg into the skin once a week. (Patient not taking: Reported on 05/24/2022) 2 mL 0   spironolactone (ALDACTONE) 100 MG tablet Take 1 tablet (100 mg total) by mouth daily. Pt needs an appt with PCP for further refills. 90 tablet 0   zolpidem (AMBIEN CR) 6.25 MG CR tablet Take 1 tablet (6.25 mg total) by mouth at bedtime as needed. 30 tablet 0   No current facility-administered medications for this visit.    REVIEW OF SYSTEMS:   Constitutional: ( - ) fevers, ( - )  chills , ( - ) night sweats Eyes: ( - )  blurriness of vision, ( - ) double vision, ( - ) watery eyes Ears, nose, mouth, throat, and face: ( - ) mucositis, ( - ) sore throat Respiratory: ( - ) cough, ( - ) dyspnea, ( - ) wheezes Cardiovascular: ( - ) palpitation, ( - ) chest discomfort, ( - ) lower extremity swelling Gastrointestinal:  ( - ) nausea, ( - ) heartburn, ( - ) change in bowel habits Skin: ( - ) abnormal skin rashes Lymphatics: ( - ) new lymphadenopathy, ( - ) easy bruising Neurological: ( - ) numbness, ( - )  tingling, ( - ) new weaknesses Behavioral/Psych: ( - ) mood change, ( - ) new changes  All other systems were reviewed with the patient and are negative.  PHYSICAL EXAMINATION:  Vitals:   06/27/22 1305  BP: (!) 154/101  Pulse: 83  Resp: 14  Temp: (!) 96.3 F (35.7 C)  SpO2: 100%   Filed Weights   06/27/22 1305  Weight: (!) 335 lb 1.6 oz (152 kg)    GENERAL: well appearing middle-aged African-American female in NAD  SKIN: skin color, texture, turgor are normal, no rashes or significant lesions EYES: conjunctiva are pink and non-injected, sclera clear LUNGS: clear to auscultation and percussion with normal breathing effort HEART: regular rate & rhythm and no murmurs and no lower extremity edema Musculoskeletal: no cyanosis of digits and no clubbing  PSYCH: alert & oriented x 3, fluent speech NEURO: no focal motor/sensory deficits  LABORATORY DATA:  I have reviewed the data as listed    Latest Ref Rng & Units 06/27/2022    1:55 PM 05/24/2022    8:55 AM 10/08/2021   10:25 AM  CBC  WBC 4.0 - 10.5 K/uL 4.0  4.5  4.3   Hemoglobin 12.0 - 15.0 g/dL 13.3  13.6  13.0   Hematocrit 36.0 - 46.0 % 40.5  40.7  40.4   Platelets 150 - 400 K/uL 280  328  259.0        Latest Ref Rng & Units 06/27/2022    1:55 PM 05/24/2022    8:55 AM 12/31/2021   10:18 AM  CMP  Glucose 70 - 99 mg/dL 108  90  99   BUN 6 - 20 mg/dL _0 Creatinine 0.44 - 1.00 mg/dL 0.91  0.94  0.95   Sodium 135 - 145 mmol/L 137  138   135   Potassium 3.5 - 5.1 mmol/L 3.9  4.7  4.2   Chloride 98 - 111 mmol/L 107  108  106   CO2 22 - 32 mmol/L _1 Calcium 8.9 - 10.3 mg/dL 9.4  9.3  9.1   Total Protein 6.5 - 8.1 g/dL 7.3  7.1    7.0    Total Bilirubin 0.3 - 1.2 mg/dL 0.6  0.3    Alkaline Phos 38 - 126 U/L 52     AST 15 - 41 U/L 38  27    ALT 0 - 44 U/L 55  46       ASSESSMENT & PLAN Karl H Ventola 46 y.o. female with medical history significant for hypertension and heart murmur who presents for evaluation of a newly diagnosed monoclonal gammopathy.  After review of the labs, review of the records, and discussion with the patient the patients findings are most consistent with a monoclonal gammopathy.   Monoclonal Gammopathies are a group of medical conditions defined by the presence of a monoclonal protein (an M protein) in the blood or urine. Monoclonal gammopathies include monoclonal gammopathy of unknown significance (MGUS), Monoclonal gammopathies of renal or neurological significance,  smoldering multiple myeloma (SMM), multiple myeloma (MM), AL amyloidosis, and Waldenstrom macroglobulinemia. The goal of the initial workup is to determine which monoclonal gammopathy a patient has. The workup consists of evaluating protein in the serum (with serum protein electrophoresis (SPEP) and serum free light chains) , evaluating protein in the urine (UPEP), and evaluation of the skeleton (DG Bone Met Survey) to assure no lytic lesions. Baseline bloodwork includes CMP and CBC. If  no CRAB criteria or high risk criteria are noted then the diagnosis is MGUS. MGUS must be followed with bloodwork periodically to assure it does not convert to multiple myeloma (occurs to approximately 1% of patients per year). If there are CRAB criteria or high risk features (such as elevated serum free light chain ratio (taking into account renal function), a non IgG M protein, or M protein >1.5) then a bone marrow biopsy must be pursued.     #IgG Lambda Monoclonal Gammopathy of Undetermined Significance --today will order an SPEP, UPEP, SFLC and beta 2 microglobulin --additionally will collect new baseline CBC, CMP, and LDH --recommend a metastatic bone survey to assess for lytic lesions --will consider the need for a bone marrow biopsy pending the above results -- Return to clinic pending the results of the above studies.   Orders Placed This Encounter  Procedures   DG Bone Survey Met    Standing Status:   Future    Standing Expiration Date:   07/01/2023    Order Specific Question:   Reason for Exam (SYMPTOM  OR DIAGNOSIS REQUIRED)    Answer:   MGUS, assess for lytic lesions    Order Specific Question:   Is patient pregnant?    Answer:   No    Order Specific Question:   Preferred imaging location?    Answer:   [facility]   CBC with Differential ([place] Only)    Standing Status:   Future    Number of Occurrences:   1    Standing Expiration Date:   06/28/2023   CMP ([place] only)    Standing Status:   Future    Number of Occurrences:   1    Standing Expiration Date:   06/28/2023   Lactate dehydrogenase (LDH)    Standing Status:   Future    Number of Occurrences:   1    Standing Expiration Date:   06/27/2023   Multiple Myeloma Panel (SPEP&IFE w/QIG)    Standing Status:   Future    Number of Occurrences:   1    Standing Expiration Date:   06/27/2023   Kappa/lambda light chains    Standing Status:   Future    Number of Occurrences:   1    Standing Expiration Date:   06/27/2023   Beta 2 microglobulin    Standing Status:   Future    Number of Occurrences:   1    Standing Expiration Date:   06/27/2023   24-Hr Ur UPEP/UIFE/Light Chains/TP    Standing Status:   Future    Standing Expiration Date:   06/27/2023    All questions were answered. The patient knows to call the clinic with any problems, questions or concerns.  A total of more than 60 minutes were spent on this encounter with  face-to-face time and non-face-to-face time, including preparing to see the patient, ordering tests and/or medications, counseling the patient and coordination of care as outlined above.   Ledell Peoples, MD Department of Hematology/Oncology [place] [facility]: [address] Phone: [phone] Pager: [phone] Email: [email or web address]  07/03/2022 5:26 PM

## 2022-06-27 ENCOUNTER — Inpatient Hospital Stay: Payer: BC Managed Care – PPO | Attending: Hematology and Oncology | Admitting: Hematology and Oncology

## 2022-06-27 ENCOUNTER — Inpatient Hospital Stay: Payer: BC Managed Care – PPO

## 2022-06-27 ENCOUNTER — Other Ambulatory Visit: Payer: Self-pay

## 2022-06-27 VITALS — BP 154/101 | HR 83 | Temp 96.3°F | Resp 14 | Wt 335.1 lb

## 2022-06-27 DIAGNOSIS — I10 Essential (primary) hypertension: Secondary | ICD-10-CM | POA: Diagnosis not present

## 2022-06-27 DIAGNOSIS — D472 Monoclonal gammopathy: Secondary | ICD-10-CM

## 2022-06-27 DIAGNOSIS — Z803 Family history of malignant neoplasm of breast: Secondary | ICD-10-CM | POA: Insufficient documentation

## 2022-06-27 LAB — CBC WITH DIFFERENTIAL (CANCER CENTER ONLY)
Abs Immature Granulocytes: 0.01 10*3/uL (ref 0.00–0.07)
Basophils Absolute: 0 10*3/uL (ref 0.0–0.1)
Basophils Relative: 1 %
Eosinophils Absolute: 0.2 10*3/uL (ref 0.0–0.5)
Eosinophils Relative: 5 %
HCT: 40.5 % (ref 36.0–46.0)
Hemoglobin: 13.3 g/dL (ref 12.0–15.0)
Immature Granulocytes: 0 %
Lymphocytes Relative: 36 %
Lymphs Abs: 1.4 10*3/uL (ref 0.7–4.0)
MCH: 27.6 pg (ref 26.0–34.0)
MCHC: 32.8 g/dL (ref 30.0–36.0)
MCV: 84 fL (ref 80.0–100.0)
Monocytes Absolute: 0.4 10*3/uL (ref 0.1–1.0)
Monocytes Relative: 11 %
Neutro Abs: 1.9 10*3/uL (ref 1.7–7.7)
Neutrophils Relative %: 47 %
Platelet Count: 280 10*3/uL (ref 150–400)
RBC: 4.82 MIL/uL (ref 3.87–5.11)
RDW: 14.4 % (ref 11.5–15.5)
WBC Count: 4 10*3/uL (ref 4.0–10.5)
nRBC: 0 % (ref 0.0–0.2)

## 2022-06-27 LAB — CMP (CANCER CENTER ONLY)
ALT: 55 U/L — ABNORMAL HIGH (ref 0–44)
AST: 38 U/L (ref 15–41)
Albumin: 4.4 g/dL (ref 3.5–5.0)
Alkaline Phosphatase: 52 U/L (ref 38–126)
Anion gap: 6 (ref 5–15)
BUN: 9 mg/dL (ref 6–20)
CO2: 24 mmol/L (ref 22–32)
Calcium: 9.4 mg/dL (ref 8.9–10.3)
Chloride: 107 mmol/L (ref 98–111)
Creatinine: 0.91 mg/dL (ref 0.44–1.00)
GFR, Estimated: >60 mL/min (ref >60)
Glucose, Bld: 108 mg/dL — ABNORMAL HIGH (ref 70–99)
Potassium: 3.9 mmol/L (ref 3.5–5.1)
Sodium: 137 mmol/L (ref 135–145)
Total Bilirubin: 0.6 mg/dL (ref 0.3–1.2)
Total Protein: 7.3 g/dL (ref 6.5–8.1)

## 2022-06-27 LAB — LACTATE DEHYDROGENASE: LDH: 206 U/L — ABNORMAL HIGH (ref 98–192)

## 2022-06-28 LAB — KAPPA/LAMBDA LIGHT CHAINS
Kappa free light chain: 13.4 mg/L (ref 3.3–19.4)
Kappa, lambda light chain ratio: 1.33 (ref 0.26–1.65)
Lambda free light chains: 10.1 mg/L (ref 5.7–26.3)

## 2022-06-29 LAB — MULTIPLE MYELOMA PANEL, SERUM
Albumin SerPl Elph-Mcnc: 3.8 g/dL (ref 2.9–4.4)
Albumin/Glob SerPl: 1.3 (ref 0.7–1.7)
Alpha 1: 0.2 g/dL (ref 0.0–0.4)
Alpha2 Glob SerPl Elph-Mcnc: 0.6 g/dL (ref 0.4–1.0)
B-Globulin SerPl Elph-Mcnc: 1.1 g/dL (ref 0.7–1.3)
Gamma Glob SerPl Elph-Mcnc: 1.2 g/dL (ref 0.4–1.8)
Globulin, Total: 3 g/dL (ref 2.2–3.9)
IgA: 108 mg/dL (ref 87–352)
IgG (Immunoglobin G), Serum: 1211 mg/dL (ref 586–1602)
IgM (Immunoglobulin M), Srm: 74 mg/dL (ref 26–217)
M Protein SerPl Elph-Mcnc: 0.5 g/dL — ABNORMAL HIGH
Total Protein ELP: 6.8 g/dL (ref 6.0–8.5)

## 2022-06-29 LAB — BETA 2 MICROGLOBULIN, SERUM: Beta-2 Microglobulin: 1.2 mg/L (ref 0.6–2.4)

## 2022-07-04 ENCOUNTER — Telehealth: Payer: Self-pay | Admitting: Hematology and Oncology

## 2022-07-04 NOTE — Telephone Encounter (Signed)
Per 12/3 IB, message left

## 2022-07-06 ENCOUNTER — Ambulatory Visit (HOSPITAL_COMMUNITY)
Admission: RE | Admit: 2022-07-06 | Discharge: 2022-07-06 | Disposition: A | Discharge status: Home / Self Care | Payer: BC Managed Care – PPO | Source: Ambulatory Visit | Attending: Hematology and Oncology | Admitting: Hematology and Oncology

## 2022-07-06 DIAGNOSIS — D472 Monoclonal gammopathy: Secondary | ICD-10-CM | POA: Insufficient documentation

## 2022-08-12 ENCOUNTER — Encounter: Payer: Self-pay | Admitting: Cardiology

## 2022-08-12 ENCOUNTER — Ambulatory Visit: Payer: BC Managed Care – PPO | Admitting: Cardiology

## 2022-08-12 VITALS — BP 160/90 | HR 81 | Resp 18 | Ht 69.0 in | Wt 337.2 lb

## 2022-08-12 DIAGNOSIS — R0683 Snoring: Secondary | ICD-10-CM

## 2022-08-12 DIAGNOSIS — D472 Monoclonal gammopathy: Secondary | ICD-10-CM

## 2022-08-12 DIAGNOSIS — I10 Essential (primary) hypertension: Secondary | ICD-10-CM

## 2022-08-12 DIAGNOSIS — E782 Mixed hyperlipidemia: Secondary | ICD-10-CM

## 2022-08-12 DIAGNOSIS — R252 Cramp and spasm: Secondary | ICD-10-CM

## 2022-08-12 DIAGNOSIS — R011 Cardiac murmur, unspecified: Secondary | ICD-10-CM

## 2022-08-12 MED ORDER — HYDRALAZINE HCL 25 MG PO TABS: 25.0000 mg | ORAL_TABLET | Freq: Three times a day (TID) | ORAL | 1 refills | Status: DC

## 2022-08-12 NOTE — Progress Notes (Signed)
d  ID:  [name], [name] 23-Jan-1976, MRN [id number]  PCP:  Waunita Schooner, MD  Cardiologist:  Rex Kras, DO, [facility] (established care 08/12/2022)  REASON FOR CONSULT: HTN and Cardiac murmur  REQUESTING PHYSICIAN:  Waunita Schooner, MD [address] [place],  [place] [address]  Chief Complaint  Patient presents with   Hypertension   Heart Murmur   New Patient (Initial Visit)    HPI  [name] is a 47 y.o. African-American female who presents to the clinic for evaluation of hypertension and cardiac murmur at the request of Waunita Schooner, MD. 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   Patient is accompanied by her husband Eddie Dibbles at today's office visit.  She is referred to the practice for evaluation and management of hypertension and cardiac murmur.  Hypertension: Diagnosed at the age of 38-41 per patient.  She was started on antihypertensive medications but has been struggling with generalized muscle cramps with various antihypertensive medications.  She has tried HCTZ, metoprolol, amlodipine, spironolactone, and hydralazine.  She is currently not on any blood pressure medications due to side effects.  Other contributing factors may include: Eating out (2 meals per day), pain medications (Aleve, Motrin).  Due to generalized muscle aches she was referred to rheumatology and now is currently being worked up for IgG lambda monoclonal gammopathy with hematology oncology.  Review of systems also positive for chest pain.  Ongoing for the last several months, occurring twice a day, pressure-like sensation, intensity 5 out of 10, lasting for few minutes, self resolved.  Not brought on by effort related activities, does not resolve with rest.  Feels generalized tired and fatigue -sleep 3 to 4 hours per night.  She was scheduled for left meniscus repairs procedure was  canceled due to elevated blood pressures.  FUNCTIONAL STATUS: No structured exercise program or daily routine (hip and knee pain)   ALLERGIES: Allergies  Allergen Reactions   Beeswax     Bee stings    MEDICATION LIST PRIOR TO VISIT: Current Meds  Medication Sig   cyclobenzaprine (FLEXERIL) 10 MG tablet Take 1 tablet (10 mg total) by mouth 3 (three) times daily as needed for muscle spasms.   ibuprofen (ADVIL) 800 MG tablet Take 1 tablet (800 mg total) by mouth every 8 (eight) hours as needed.   levonorgestrel (MIRENA) 20 MCG/24HR IUD 1 each by Intrauterine route once.   zolpidem (AMBIEN CR) 6.25 MG CR tablet Take 1 tablet (6.25 mg total) by mouth at bedtime as needed.     PAST MEDICAL HISTORY: Past Medical History:  Diagnosis Date   Hypertension    Murmur     PAST SURGICAL HISTORY: Past Surgical History:  Procedure Laterality Date   ABDOMINOPLASTY     ACHILLES TENDON SURGERY  06/08/2012   Procedure: ACHILLES TENDON REPAIR;  Surgeon: Newt Minion, MD;  Location: [facility];  Service: Orthopedics;  Laterality: Right;  Right Achilles Reconstruction   APPENDECTOMY     CHOLECYSTECTOMY     HYSTEROSCOPY WITH D & C N/A 03/06/2014   Procedure: DILATATION AND CURETTAGE With IUD Removal;  Surgeon: Marvene Staff, MD;  Location: [facility];  Service: Gynecology;  Laterality: N/A;   KNEE SURGERY     right knee arthroscropic    FAMILY HISTORY: The patient family history includes Autism in her sister; Diabetes in her father and mother; Healthy in her daughter, daughter, and son; Hypertension in  her father, mother, and sister.  SOCIAL HISTORY:  The patient  reports that she has never smoked. She has never been exposed to tobacco smoke. She has never used smokeless tobacco. She reports current alcohol use. She reports that she does not use drugs.  REVIEW OF SYSTEMS: Review of Systems  Constitutional: Positive for malaise/fatigue.       Obesity  Cardiovascular:  Positive for chest pain  (see HPI). Negative for claudication, dyspnea on exertion, irregular heartbeat, leg swelling, near-syncope, orthopnea, palpitations, paroxysmal nocturnal dyspnea and syncope.  Respiratory:  Positive for snoring. Negative for shortness of breath.   Hematologic/Lymphatic: Negative for bleeding problem.  Musculoskeletal:  Positive for joint pain, muscle cramps and stiffness. Negative for myalgias.  Neurological:  Positive for headaches. Negative for dizziness and light-headedness.    PHYSICAL EXAM:    08/12/2022    8:39 AM 06/27/2022    1:05 PM 06/09/2022   11:42 AM  Vitals with BMI  Height '5\' 9"'$     Weight 337 lbs 3 oz 335 lbs 2 oz   BMI 49.67 59.16   Systolic 384 665 993  Diastolic 90 570 86  Pulse 81 83 99    Physical Exam  Constitutional: No distress.  Age appropriate, hemodynamically stable.   Neck: No JVD present.  Cardiovascular: Normal rate, regular rhythm, S1 normal, S2 normal, intact distal pulses and normal pulses. Exam reveals no gallop, no S3 and no S4.  Murmur heard. Pulses:      Dorsalis pedis pulses are 2+ on the right side and 2+ on the left side.       Posterior tibial pulses are 2+ on the right side and 2+ on the left side.  Pulmonary/Chest: Effort normal and breath sounds normal. No stridor. She has no wheezes. She has no rales.  Abdominal: Soft. Bowel sounds are normal. She exhibits no distension. There is no abdominal tenderness.  Abdominal obesity  Musculoskeletal:        General: No edema.     Cervical back: Neck supple.  Neurological: She is alert and oriented to person, place, and time. She has intact cranial nerves (2-12).  Skin: Skin is warm and moist.    RADIOLOGY: Renal ultrasound: 01/03/2022: 1. Normal ultrasound appearance of the kidneys 2. The liver appears echogenic suggesting hepatic steatosis, this may be correlated with LFTs  DG bone survey: 07/06/2022 1. No worrisome lytic or sclerotic lesions. 2. Reversal the normal cervical  lordosis. 3. 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   CARDIAC DATABASE: EKG: 08/12/2022: Sinus rhythm, 75 bpm, LAE, nonspecific T wave abnormality.  Echocardiogram: No results found for this or any previous visit from the past 1095 days.    Stress Testing: No results found for this or any previous visit from the past 1095 days.   Heart Catheterization: None  LABORATORY DATA:    Latest Ref Rng & Units 06/27/2022    1:55 PM 05/24/2022    8:55 AM 10/08/2021   10:25 AM  CBC  WBC 4.0 - 10.5 K/uL 4.0  4.5  4.3   Hemoglobin 12.0 - 15.0 g/dL 13.3  13.6  13.0   Hematocrit 36.0 - 46.0 % 40.5  40.7  40.4   Platelets 150 - 400 K/uL 280  328  259.0        Latest Ref Rng & Units 06/27/2022    1:55 PM 05/24/2022    8:55 AM 12/31/2021   10:18 AM  CMP  Glucose 70 - 99 mg/dL 108  90  99   BUN 6 - 20 mg/dL '9  13  13   '$ Creatinine 0.44 - 1.00 mg/dL 0.91  0.94  0.95   Sodium 135 - 145 mmol/L 137  138  135   Potassium 3.5 - 5.1 mmol/L 3.9  4.7  4.2   Chloride 98 - 111 mmol/L 107  108  106   CO2 22 - 32 mmol/L '24  24  23   '$ Calcium 8.9 - 10.3 mg/dL 9.4  9.3  9.1   Total Protein 6.5 - 8.1 g/dL 7.3  7.1    7.0    Total Bilirubin 0.3 - 1.2 mg/dL 0.6  0.3    Alkaline Phos 38 - 126 U/L 52     AST 15 - 41 U/L 38  27    ALT 0 - 44 U/L 55  46      Lipid Panel     Component Value Date/Time   CHOL 188 10/08/2021 1025   TRIG 122.0 10/08/2021 1025   HDL 34.50 (L) 10/08/2021 1025   CHOLHDL 5 10/08/2021 1025   VLDL 24.4 10/08/2021 1025   LDLCALC 130 (H) 10/08/2021 1025    No components found for: "NTPROBNP" No results for input(s): "PROBNP" in the last 8760 hours. Recent Labs    10/08/21 1025 05/24/22 0855  TSH 3.73 3.40    BMP Recent Labs    12/31/21 1018 05/24/22 0855 06/27/22 1355  NA 135 138 137  K 4.2 4.7 3.9  CL 106 108 107  CO2 '23 24 24  '$ GLUCOSE 99 90 108*  BUN '13 13 9  '$ CREATININE 0.95 0.94 0.91  CALCIUM 9.1 9.3 9.4  GFRNONAA  --   --  >60     HEMOGLOBIN A1C Lab Results  Component Value Date   HGBA1C 5.8 10/08/2021    IMPRESSION:    ICD-10-CM   1. Murmur  R01.1 EKG 12-Lead    ECHOCARDIOGRAM COMPLETE    2. Essential hypertension  I10 Aldosterone + renin activity w/ ratio    CMP14+EGFR    Hemoglobin and hematocrit, blood    TSH    Metanephrines, plasma    Metanephrines, Urine, 24 hour    5 HIAA, quantitative, Urine, 24 hour    Cortisol-am, blood    ECHOCARDIOGRAM COMPLETE    Ambulatory referral to Sleep Studies    hydrALAZINE (APRESOLINE) 25 MG tablet    Urine Microalbumin w/creat. ratio    3. Muscle cramps  R25.2 CK Total (and CKMB)    C-reactive protein    Sed Rate (ESR)    4. Mixed hyperlipidemia  E78.2 CMP14+EGFR    Lipid Panel With LDL/HDL Ratio    LDL cholesterol, direct    5. Snoring  R06.83 Ambulatory referral to Sleep Studies    6. Gammopathy  D47.2     7. Class 3 severe obesity due to excess calories with serious comorbidity and body mass index (BMI) of 45.0 to 49.9 in adult [facility])  E66.01    Z68.42        RECOMMENDATIONS: BARBARAANN AVANS is a 47 y.o. 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   Murmur Soft systolic murmur heard on examination could be physiological given her elevated high blood pressure. Will proceed with an echocardiogram for further evaluation.  Would like to have screening imaging performed given her underlying gammopathy.  Essential hypertension Uncontrolled Not taking any home medications due to generalized muscle  cramps. Currently prescribed amlodipine, hydralazine, spironolactone and in the past has tried hydrochlorothiazide and metoprolol (all cause cramps).  I educated the patient that it is very less likely that so many various antihypertensive medications are causing her to have muscle cramps.  I would recommend introducing 1  pharmacological class at a time to see if the symptoms resurface or worsen.. Will hold off on diuretics for now. Continue hydralazine 25 mg p.o. 3 times daily. Asked her to keep a BP log at home regularly and to review it either myself or PCP. Educated her on the importance of reducing salt in her diet and decreasing eating out. Check aldosterone and ratio, TSH, cortisol level, plasma metanephrines, 24-hour urine for metanephrines and if [place] IAA, and urine protein creatinine ratio. Given her uncontrolled hypertension, snoring, poor sleeping habits will refer to sleep medicine to evaluate for sleep apnea. I cannot rule out component of rheumatological disease contributing to her generalized muscle cramps. I am concerned that prolonged uncontrolled BP may increase her risk for stroke, cardiovascular comorbidities, and progression of CKD.  For these reasons she is more than welcome to follow-up with tertiary care with regards to blood pressure management given her symptoms.  Muscle cramps Currently follows up with rheumatology.  Mixed hyperlipidemia Currently not on statin therapy. Patient would like to avoid pharmacological therapy due to her baseline muscle cramps. Is willing to check fasting lipid profile. Further recommendations to follow.  Her precordial pain appears to be noncardiac.  However, would like to have better blood pressure management prior to proceeding with exercise treadmill stress test.  In the interim if she has worsening discomfort which increases in intensity, frequency, duration or has cardiac discomfort she is asked to go to the closest ER via EMS for further evaluation and management patient and husband are in agreement.  FINAL MEDICATION LIST END OF ENCOUNTER: Meds ordered this encounter  Medications   hydrALAZINE (APRESOLINE) 25 MG tablet    Sig: Take 1 tablet (25 mg total) by mouth 3 (three) times daily.    Dispense:  90 tablet    Refill:  1     Current  Outpatient Medications:    cyclobenzaprine (FLEXERIL) 10 MG tablet, Take 1 tablet (10 mg total) by mouth 3 (three) times daily as needed for muscle spasms., Disp: 30 tablet, Rfl: 0   ibuprofen (ADVIL) 800 MG tablet, Take 1 tablet (800 mg total) by mouth every 8 (eight) hours as needed., Disp: 90 tablet, Rfl: 0   levonorgestrel (MIRENA) 20 MCG/24HR IUD, 1 each by Intrauterine route once., Disp: , Rfl:    zolpidem (AMBIEN CR) 6.25 MG CR tablet, Take 1 tablet (6.25 mg total) by mouth at bedtime as needed., Disp: 30 tablet, Rfl: 0   hydrALAZINE (APRESOLINE) 25 MG tablet, Take 1 tablet (25 mg total) by mouth 3 (three) times daily., Disp: 90 tablet, Rfl: 1  Orders Placed This Encounter  Procedures   Aldosterone + renin activity w/ ratio   CMP14+EGFR   Hemoglobin and hematocrit, blood   TSH   Metanephrines, plasma   Metanephrines, Urine, 24 hour   5 HIAA, quantitative, Urine, 24 hour   Cortisol-am, blood   Lipid Panel With LDL/HDL Ratio   LDL cholesterol, direct   CK Total (and CKMB)   C-reactive protein   Sed Rate (ESR)   Urine Microalbumin w/creat. ratio   Ambulatory referral to Sleep Studies   EKG 12-Lead   ECHOCARDIOGRAM COMPLETE    There are no Patient Instructions on file  for this visit.   --Continue cardiac medications as reconciled in final medication list. --Return in about 3 weeks (around 09/02/2022) for Follow up, BP. or sooner if needed. --Continue follow-up with your primary care physician regarding the management of your other chronic comorbid conditions.  Patient's questions and concerns were addressed to her satisfaction. She voices understanding of the instructions provided during this encounter.   This note was created using a voice recognition software as a result there may be grammatical errors inadvertently enclosed that do not reflect the nature of this encounter. Every attempt is made to correct such errors.  Rex Kras, [place], [facility]  Pager: [phone] Office:  [phone]

## 2022-08-13 LAB — MICROALBUMIN / CREATININE URINE RATIO
Creatinine, Urine: 305.2 mg/dL
Microalb/Creat Ratio: 9 mg/g creat (ref 0–29)
Microalbumin, Urine: 27 ug/mL

## 2022-08-19 LAB — SEDIMENTATION RATE: Sed Rate: 31 mm/hr (ref 0–32)

## 2022-08-19 LAB — CMP14+EGFR
ALT: 71 IU/L — ABNORMAL HIGH (ref 0–32)
AST: 55 IU/L — ABNORMAL HIGH (ref 0–40)
Albumin/Globulin Ratio: 1.7 (ref 1.2–2.2)
Albumin: 4.7 g/dL (ref 3.9–4.9)
Alkaline Phosphatase: 57 IU/L (ref 44–121)
BUN/Creatinine Ratio: 12 (ref 9–23)
BUN: 13 mg/dL (ref 6–24)
Bilirubin Total: 0.7 mg/dL (ref 0.0–1.2)
CO2: 16 mmol/L — ABNORMAL LOW (ref 20–29)
Calcium: 9.7 mg/dL (ref 8.7–10.2)
Chloride: 104 mmol/L (ref 96–106)
Creatinine, Ser: 1.09 mg/dL — ABNORMAL HIGH (ref 0.57–1.00)
Globulin, Total: 2.7 g/dL (ref 1.5–4.5)
Glucose: 109 mg/dL — ABNORMAL HIGH (ref 70–99)
Potassium: 4.7 mmol/L (ref 3.5–5.2)
Sodium: 138 mmol/L (ref 134–144)
Total Protein: 7.4 g/dL (ref 6.0–8.5)
eGFR: 64 mL/min/{1.73_m2} (ref >59)

## 2022-08-19 LAB — HEMOGLOBIN AND HEMATOCRIT, BLOOD
Hematocrit: 42.6 % (ref 34.0–46.6)
Hemoglobin: 13.8 g/dL (ref 11.1–15.9)

## 2022-08-19 LAB — LIPID PANEL WITH LDL/HDL RATIO
Cholesterol, Total: 202 mg/dL — ABNORMAL HIGH (ref 100–199)
HDL: 41 mg/dL (ref >39)
LDL Chol Calc (NIH): 137 mg/dL — ABNORMAL HIGH (ref 0–99)
LDL/HDL Ratio: 3.3 ratio — ABNORMAL HIGH (ref 0.0–3.2)
Triglycerides: 135 mg/dL (ref 0–149)
VLDL Cholesterol Cal: 24 mg/dL (ref 5–40)

## 2022-08-19 LAB — METANEPHRINES, PLASMA
Metanephrine, Free: 42 pg/mL (ref 0.0–88.0)
Normetanephrine, Free: 201.1 pg/mL (ref 0.0–218.9)

## 2022-08-19 LAB — ALDOSTERONE + RENIN ACTIVITY W/ RATIO
Aldos/Renin Ratio: 10.6 (ref 0.0–30.0)
Aldosterone: 14.4 ng/dL (ref 0.0–30.0)
Renin Activity, Plasma: 1.36 ng/mL/hr (ref 0.167–5.380)

## 2022-08-19 LAB — CORTISOL-AM, BLOOD: Cortisol - AM: 5.9 ug/dL — ABNORMAL LOW (ref 6.2–19.4)

## 2022-08-19 LAB — TSH: TSH: 2.49 u[IU]/mL (ref 0.450–4.500)

## 2022-08-19 LAB — CK TOTAL AND CKMB (NOT AT ARMC)
CK-MB Index: 2.2 ng/mL (ref 0.0–5.3)
Total CK: 260 U/L — ABNORMAL HIGH (ref 32–182)

## 2022-08-19 LAB — C-REACTIVE PROTEIN: CRP: <1 mg/L (ref 0–10)

## 2022-08-19 LAB — LDL CHOLESTEROL, DIRECT: LDL Direct: 144 mg/dL — ABNORMAL HIGH (ref 0–99)

## 2022-08-24 NOTE — Progress Notes (Signed)
Gave patient results. She acknowledged understanding, and will schedule an appt with rheumatology. She had no further questions. Also reminded patient of 09/05/22 appt with out office.

## 2022-08-30 ENCOUNTER — Ambulatory Visit: Payer: BC Managed Care – PPO | Admitting: Family

## 2022-08-30 ENCOUNTER — Encounter: Payer: Self-pay | Admitting: Family

## 2022-08-30 VITALS — BP 138/92 | HR 98 | Temp 98.8°F | Ht 69.0 in | Wt 339.0 lb

## 2022-08-30 DIAGNOSIS — R7303 Prediabetes: Secondary | ICD-10-CM

## 2022-08-30 DIAGNOSIS — Z91030 Bee allergy status: Secondary | ICD-10-CM | POA: Diagnosis not present

## 2022-08-30 DIAGNOSIS — I10 Essential (primary) hypertension: Secondary | ICD-10-CM

## 2022-08-30 DIAGNOSIS — G4726 Circadian rhythm sleep disorder, shift work type: Secondary | ICD-10-CM

## 2022-08-30 DIAGNOSIS — R252 Cramp and spasm: Secondary | ICD-10-CM

## 2022-08-30 DIAGNOSIS — G479 Sleep disorder, unspecified: Secondary | ICD-10-CM

## 2022-08-30 DIAGNOSIS — E559 Vitamin D deficiency, unspecified: Secondary | ICD-10-CM

## 2022-08-30 DIAGNOSIS — R197 Diarrhea, unspecified: Secondary | ICD-10-CM

## 2022-08-30 DIAGNOSIS — M62838 Other muscle spasm: Secondary | ICD-10-CM

## 2022-08-30 DIAGNOSIS — E66813 Obesity, class 3: Secondary | ICD-10-CM

## 2022-08-30 DIAGNOSIS — R7989 Other specified abnormal findings of blood chemistry: Secondary | ICD-10-CM

## 2022-08-30 MED ORDER — EPINEPHRINE 0.3 MG/0.3ML IJ SOAJ: 0.3000 mg | INTRAMUSCULAR | 0 refills | Status: AC | PRN

## 2022-08-30 MED ORDER — ZOLPIDEM TARTRATE ER 6.25 MG PO TBCR: 6.2500 mg | EXTENDED_RELEASE_TABLET | Freq: Every evening | ORAL | 0 refills | Status: AC | PRN

## 2022-08-30 MED ORDER — TIZANIDINE HCL 4 MG PO TABS: ORAL_TABLET | ORAL | 0 refills | Status: DC

## 2022-08-30 NOTE — Assessment & Plan Note (Signed)
Pt advised of the following: Work on a diabetic diet, try to incorporate exercise at least 20-30 a day for 3 days a week or more.  A1c ordered pending results. 

## 2022-08-30 NOTE — Assessment & Plan Note (Signed)
Continue f/u with cardiologist  Hydralazine as prescribed for now

## 2022-08-30 NOTE — Patient Instructions (Addendum)
Try some over the counter magnesium malate 300 or 400 mg once daily.   Take vitamin D3 2000 IU once daily over the counter.   Recommend looking at the autoimmune protocol and or also looking at the antiinflammatory diet.    Regards,   Leilany Digeronimo   ------------------------------------ Exercising to Lose Weight Getting regular exercise is important for everyone. It is especially important if you are overweight. Being overweight increases your risk of heart disease, stroke, diabetes, high blood pressure, and several types of cancer. Exercising, and reducing the calories you consume, can help you lose weight and improve fitness and health. Exercise can be moderate or vigorous intensity. To lose weight, most people need to do a certain amount of moderate or vigorous-intensity exercise each week. How can exercise affect me? You lose weight when you exercise enough to burn more calories than you eat. Exercise also reduces body fat and builds muscle. The more muscle you have, the more calories you burn. Exercise also: Improves mood. Reduces stress and tension. Improves your overall fitness, flexibility, and endurance. Increases bone strength. Moderate-intensity exercise  Moderate-intensity exercise is any activity that gets you moving enough to burn at least three times more energy (calories) than if you were sitting. Examples of moderate exercise include: Walking a mile in 15 minutes. Doing light yard work. Biking at an easy pace. Most people should get at least 150 minutes of moderate-intensity exercise a week to maintain their body weight. Vigorous-intensity exercise Vigorous-intensity exercise is any activity that gets you moving enough to burn at least six times more calories than if you were sitting. When you exercise at this intensity, you should be working hard enough that you are not able to carry on a conversation. Examples of vigorous exercise include: Running. Playing a  team sport, such as football, basketball, and soccer. Jumping rope. Most people should get at least 75 minutes a week of vigorous exercise to maintain their body weight. What actions can I take to lose weight? The amount of exercise you need to lose weight depends on: Your age. The type of exercise. Any health conditions you have. Your overall physical ability. Talk to your health care provider about how much exercise you need and what types of activities are safe for you. Nutrition  Make changes to your diet as told by your health care provider or diet and nutrition specialist (dietitian). This may include: Eating fewer calories. Eating more protein. Eating less unhealthy fats. Eating a diet that includes fresh fruits and vegetables, whole grains, low-fat dairy products, and lean protein. Avoiding foods with added fat, salt, and sugar. Drink plenty of water while you exercise to prevent dehydration or heat stroke. Activity Choose an activity that you enjoy and set realistic goals. Your health care provider can help you make an exercise plan that works for you. Exercise at a moderate or vigorous intensity most days of the week. The intensity of exercise may vary from person to person. You can tell how intense a workout is for you by paying attention to your breathing and heartbeat. Most people will notice their breathing and heartbeat get faster with more intense exercise. Do resistance training twice each week, such as: Push-ups. Sit-ups. Lifting weights. Using resistance bands. Getting short amounts of exercise can be just as helpful as long, structured periods of exercise. If you have trouble finding time to exercise, try doing these things as part of your daily routine: Get up, stretch, and walk around every 30 minutes throughout  the day. Go for a walk during your lunch break. Park your car farther away from your destination. If you take public transportation, get off one stop  early and walk the rest of the way. Make phone calls while standing up and walking around. Take the stairs instead of elevators or escalators. Wear comfortable clothes and shoes with good support. Do not exercise so much that you hurt yourself, feel dizzy, or get very short of breath. Where to find more information [place].S. Department of Health and Human Services: [employer]at Centers for Disease Control and Prevention: http://www.wolf.info/ Contact a health care provider: Before starting a new exercise program. If you have questions or concerns about your weight. If you have a medical problem that keeps you from exercising. Get help right away if: You have any of the following while exercising: Injury. Dizziness. Difficulty breathing or shortness of breath that does not go away when you stop exercising. Chest pain. Rapid heartbeat. These symptoms may represent a serious problem that is an emergency. Do not wait to see if the symptoms will go away. Get medical help right away. Call your local emergency services (911 in the [place] Do not drive yourself to the hospital. Summary Getting regular exercise is especially important if you are overweight. Being overweight increases your risk of heart disease, stroke, diabetes, high blood pressure, and several types of cancer. Losing weight happens when you burn more calories than you eat. Reducing the amount of calories you eat, and getting regular moderate or vigorous exercise each week, helps you lose weight. This information is not intended to replace advice given to you by your health care provider. Make sure you discuss any questions you have with your health care provider. Document Revised: 09/13/2020 Document Reviewed: 09/13/2020 Elsevier Patient Education  [place].  FNP-C

## 2022-08-30 NOTE — Assessment & Plan Note (Addendum)
Ck elevation, being monitored by rheumatology and cardiology  Recommend otc mag malate 300 mg once daily Make sure drinking 4-6 glasses water daily Rx tizanidine 4 mg 1/2 to 1 tablet po qhs prn

## 2022-08-30 NOTE — Assessment & Plan Note (Signed)
Pt advised to work on diet and exercise as tolerated Advised to try low impact diets

## 2022-08-30 NOTE — Progress Notes (Signed)
New Patient Office Visit  Subjective:  Patient ID: [name], female    DOB: 1976/06/01  Age: 47 y.o. MRN: [id number]  CC:  Chief Complaint  Patient presents with   Establish Care    TOC from [name]    HPI [name] is here for a transition of care visit. Oriented to practice routines and expectations.  Prior provider was: [name]  Pt is with acute concerns.   Ongoing elevated blood pressure. Has tried combination of medications in the pasts, but often with weird body cramps. She states if she takes blood pressure medication she finds she has body cramps. She has tried HCTZ metoprolol amlodipine, spironolactone and hydralazine.    Elevated CK, slightly elevated, has seen rheumatology and also   Cardiology.   Elevated m protein spike: seeing hematology.   Elevated LDL:  Lab Results  Component Value Date   CHOL 202 (H) 08/12/2022   HDL 41 08/12/2022   LDLCALC 137 (H) 08/12/2022   LDLDIRECT 144 (H) 08/12/2022   TRIG 135 08/12/2022   CHOLHDL 5 29/93/7169   Last metabolic panel Lab Results  Component Value Date   GLUCOSE 109 (H) 08/12/2022   NA 138 08/12/2022   K 4.7 08/12/2022   CL 104 08/12/2022   CO2 16 (L) 08/12/2022   BUN 13 08/12/2022   CREATININE 1.09 (H) 08/12/2022   EGFR 64 08/12/2022   CALCIUM 9.7 08/12/2022   PROT 7.4 08/12/2022   ALBUMIN 4.7 08/12/2022   LABGLOB 2.7 08/12/2022   AGRATIO 1.7 08/12/2022   BILITOT 0.7 08/12/2022   ALKPHOS 57 08/12/2022   AST 55 (H) 08/12/2022   ALT 71 (H) 08/12/2022   ANIONGAP 6 06/27/2022      Lab Results  Component Value Date   CKTOTAL 260 (H) 08/12/2022   CKMBINDEX 2.2 08/12/2022   TROPONINI <0.03 08/09/2018      chronic concerns:  Sleep disorder: ambien 6.25 mg CR bedtime, not always taking nightly. Hasn't been using it lately. Without ambien sleeps four hours, with it she sleeps about 6-7 hours a night. Does not sleep walk.   As mirena in place.     ROS: Negative  unless specifically indicated above in HPI.   Current Outpatient Medications:    EPINEPHrine 0.3 mg/0.3 mL IJ SOAJ injection, Inject 0.3 mg into the muscle as needed for anaphylaxis., Disp: 1 each, Rfl: 0   hydrALAZINE (APRESOLINE) 25 MG tablet, Take 1 tablet (25 mg total) by mouth 3 (three) times daily., Disp: 90 tablet, Rfl: 1   levonorgestrel (MIRENA) 20 MCG/24HR IUD, 1 each by Intrauterine route once., Disp: , Rfl:    tiZANidine (ZANAFLEX) 4 MG tablet, Take 1/2 to 1 tablet po qhs prn muscle spasm, Disp: 30 tablet, Rfl: 0   zolpidem (AMBIEN CR) 6.25 MG CR tablet, Take 1 tablet (6.25 mg total) by mouth at bedtime as needed., Disp: 30 tablet, Rfl: 0 Past Medical History:  Diagnosis Date   Hypertension    Murmur    Past Surgical History:  Procedure Laterality Date   ABDOMINOPLASTY     ACHILLES TENDON SURGERY  06/08/2012   Procedure: ACHILLES TENDON REPAIR;  Surgeon: Karen Minion, MD;  Location: [place];  Service: Orthopedics;  Laterality: Right;  Right Achilles Reconstruction   APPENDECTOMY     CHOLECYSTECTOMY     HYSTEROSCOPY WITH D & C N/A 03/06/2014   Procedure: DILATATION AND CURETTAGE With IUD Removal;  Surgeon: Karen Staff, MD;  Location: [facility];  Service: Gynecology;  Laterality: N/A;   KNEE SURGERY     right knee arthroscropic    Objective:   Today's Vitals: BP (!) 138/92   Pulse 98   Temp 98.8 F (37.1 C) (Oral)   Ht '5\' 9"'$  (1.753 m)   Wt (!) 339 lb (153.8 kg)   SpO2 99%   BMI 50.06 kg/m   Physical Exam Vitals reviewed.  Constitutional:      General: She is not in acute distress.    Appearance: Normal appearance. She is obese. She is not ill-appearing, toxic-appearing or diaphoretic.  HENT:     Head: Normocephalic.  Cardiovascular:     Rate and Rhythm: Normal rate and regular rhythm.  Pulmonary:     Effort: Pulmonary effort is normal.  Musculoskeletal:        General: Normal range of motion.  Neurological:     General: No focal deficit present.      Mental Status: She is alert and oriented to person, place, and time. Mental status is at baseline.  Psychiatric:        Mood and Affect: Mood normal.        Behavior: Behavior normal.        Thought Content: Thought content normal.        Judgment: Judgment normal.     Assessment & Plan:  Allergy to honey bee venom -     EPINEPHrine; Inject 0.3 mg into the muscle as needed for anaphylaxis.  Dispense: 1 each; Refill: 0  Shift work sleep disorder  Sleep disorder -     Zolpidem Tartrate ER; Take 1 tablet (6.25 mg total) by mouth at bedtime as needed.  Dispense: 30 tablet; Refill: 0  Vitamin D deficiency  Muscle spasms of both lower extremities -     tiZANidine HCl; Take 1/2 to 1 tablet po qhs prn muscle spasm  Dispense: 30 tablet; Refill: 0  Elevated LFTs  Diarrhea, unspecified type Assessment & Plan: Ordering celiac panel pending results (for elevated lfts and also elevated ck) Advised pt trial without gluten to see if spasms improve   Orders: -     Celiac Pnl 2 rflx Endomysial Ab Ttr  Prediabetes Assessment & Plan: Pt advised of the following: Work on a diabetic diet, try to incorporate exercise at least 20-30 a day for 3 days a week or more.  A1c ordered pending results  Orders: -     Hemoglobin A1c  Essential hypertension Assessment & Plan: Continue f/u with cardiologist  Hydralazine as prescribed for now   Obesity, Class III, BMI 40-49.9 (morbid obesity) ([place]) Assessment & Plan: Pt advised to work on diet and exercise as tolerated Advised to try low impact diets   Muscle cramps Assessment & Plan: Ck elevation, being monitored by rheumatology and cardiology  Recommend otc mag malate 300 mg once daily Make sure drinking 4-6 glasses water daily Rx tizanidine 4 mg 1/2 to 1 tablet po qhs prn     Follow-up: Return in about 6 months (around 02/28/2023) for f/u obesity .   Karen Pancoast, FNP

## 2022-08-30 NOTE — Assessment & Plan Note (Signed)
Ordering celiac panel pending results (for elevated lfts and also elevated ck) Advised pt trial without gluten to see if spasms improve

## 2022-08-31 ENCOUNTER — Ambulatory Visit (HOSPITAL_COMMUNITY)
Admission: RE | Admit: 2022-08-31 | Discharge: 2022-08-31 | Disposition: A | Discharge status: Home / Self Care | Payer: BC Managed Care – PPO | Source: Ambulatory Visit | Attending: Cardiology | Admitting: Cardiology

## 2022-08-31 DIAGNOSIS — R079 Chest pain, unspecified: Secondary | ICD-10-CM | POA: Insufficient documentation

## 2022-08-31 DIAGNOSIS — R011 Cardiac murmur, unspecified: Secondary | ICD-10-CM | POA: Diagnosis not present

## 2022-08-31 DIAGNOSIS — I10 Essential (primary) hypertension: Secondary | ICD-10-CM | POA: Insufficient documentation

## 2022-08-31 LAB — HEMOGLOBIN A1C: Hgb A1c MFr Bld: 5.8 % (ref 4.6–6.5)

## 2022-08-31 NOTE — Progress Notes (Signed)
  Echocardiogram 2D Echocardiogram has been performed.  [name] 08/31/2022, 4:04 PM

## 2022-08-31 NOTE — Progress Notes (Signed)
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 female with history of elevated CK, muscle cramps and polyarthralgia.  She states that she continues to have elevated blood pressure.  Her cardiologist tried different medications but they cause muscle cramps.  She states her blood pressure is still elevated and for that reason she cannot have left knee joint meniscal tear repair.  She continues to have some discomfort in her right hip and her right knee.  None of the joints are painful.  She was evaluated by cardiology for for abnormal SPEP.  She is waiting to hear back from them.  She has not noticed any joint swelling.    Activities of Daily Living:  Patient reports morning stiffness for ongoing through the day due to sedentary work .   Patient Reports nocturnal pain.  Difficulty dressing/grooming: Reports Difficulty climbing stairs: Reports Difficulty getting out of chair: Reports Difficulty using hands for taps, buttons, cutlery, and/or writing: Denies  Review of Systems  Constitutional:  Positive for fatigue.  HENT:  Negative for mouth sores.   Eyes:  Positive for dryness.  Respiratory:  Negative for difficulty breathing.   Cardiovascular:  Positive for palpitations.  Gastrointestinal: Negative.  Negative for blood in stool, constipation and diarrhea.  Endocrine: Negative.  Negative for increased urination.  Genitourinary: Negative.  Negative for involuntary urination.  Musculoskeletal:  Positive for joint pain, joint pain, joint swelling, myalgias, muscle weakness, morning stiffness, muscle tenderness and myalgias. Negative for gait problem.  Skin: Negative.  Negative for color change,  rash, hair loss and sensitivity to sunlight.  Allergic/Immunologic: Negative.  Negative for susceptible to infections.  Neurological:  Positive for dizziness and headaches.  Hematological: Negative.  Negative for swollen glands.  Psychiatric/Behavioral:  Positive for sleep disturbance. Negative for depressed mood. The patient is nervous/anxious.     PMFS History:  Patient Active Problem List   Diagnosis Date Noted   Diarrhea 08/30/2022   Prediabetes 08/30/2022   Snoring 04/28/2022   Murmur 12/31/2021   Proteinuria 12/31/2021   Dermatitis 04/13/2021   Muscle cramps 02/27/2020   Essential hypertension 08/14/2018   Obesity, Class III, BMI 40-49.9 (morbid obesity) ([place]) 08/14/2018    Past Medical History:  Diagnosis Date   Hypertension    Murmur     Family History  Problem Relation Age of Onset   Diabetes Mother    Hypertension Mother    Diabetes Father    Hypertension Father    Hypertension Sister    Autism Sister    Healthy Son    Healthy Daughter    Healthy Daughter    Past Surgical History:  Procedure Laterality Date   ABDOMINOPLASTY     ACHILLES TENDON SURGERY  06/08/2012   Procedure: ACHILLES TENDON REPAIR;  Surgeon: Newt Minion, MD;  Location: [place];  Service: Orthopedics;  Laterality: Right;  Right Achilles Reconstruction   APPENDECTOMY     CHOLECYSTECTOMY     HYSTEROSCOPY WITH D & C N/A 03/06/2014   Procedure: DILATATION AND CURETTAGE With IUD Removal;  Surgeon: Marvene Staff,  MD;  Location: [place] [facility];  Service: Gynecology;  Laterality: N/A;   KNEE SURGERY     right knee arthroscropic   Social History   Social History Narrative      3 kids - ages 51 - 14   Husband - Eddie Dibbles   Enjoys: watching her kids play sports, spending time with husband   Exercise: not regular now - hoping to get back to it   Diet: not get currently, but hoping to do better   Immunization History  Administered Date(s) Administered   DTaP 07/12/1977, 10/10/1977, 12/15/1977,  12/06/1978, 04/02/1982   Hepatitis A 05/13/2014, 06/13/2014   Hepatitis A, Ped/Adol-2 Dose 01/19/2015   Hepatitis B 05/13/2014, 06/13/2014, 01/19/2015   IPV 07/12/1977, 10/10/1977, 12/15/1977, 04/02/1982   Influenza,inj,Quad PF,6+ Mos 04/28/2022   MMR 07/17/1978, 12/06/1978   PFIZER(Purple Top)SARS-COV-2 Vaccination 09/24/2019, 10/14/2019, 06/10/2020   Td 03/07/1995   Tdap 06/24/2012   Varicella 05/13/2014, 06/13/2014     Objective: Vital Signs: BP (!) 142/95 (BP Location: Left Arm, Patient Position: Sitting, Cuff Size: Large)   Pulse 69   Resp 14   Ht 5' 9"$  (1.753 m)   Wt (!) 341 lb (154.7 kg)   BMI 50.36 kg/m    Physical Exam Vitals and nursing note reviewed.  Constitutional:      Appearance: She is well-developed.  HENT:     Head: Normocephalic and atraumatic.  Eyes:     Conjunctiva/sclera: Conjunctivae normal.  Cardiovascular:     Rate and Rhythm: Normal rate and regular rhythm.     Heart sounds: Normal heart sounds.  Pulmonary:     Effort: Pulmonary effort is normal.     Breath sounds: Normal breath sounds.  Abdominal:     General: Bowel sounds are normal.     Palpations: Abdomen is soft.  Musculoskeletal:     Cervical back: Normal range of motion.  Lymphadenopathy:     Cervical: No cervical adenopathy.  Skin:    General: Skin is warm and dry.     Capillary Refill: Capillary refill takes less than 2 seconds.  Neurological:     Mental Status: She is alert and oriented to person, place, and time.  Psychiatric:        Behavior: Behavior normal.      Musculoskeletal Exam: Cervical spine was in good range of motion.  She had no tenderness over thoracic or lumbar spine.  Shoulders, elbows, wrist joints, MCPs PIPs and DIPs been good range of motion.  She had good range of motion of bilateral hip joints with some tenderness over right trochanteric region.  She had good range of motion of bilateral knee joints without any warmth swelling or effusion.  There was no  tenderness over ankles or MTP joints.  CDAI Exam: CDAI Score: -- Patient Global: --; Provider Global: -- Swollen: --; Tender: -- Joint Exam 09/14/2022   No joint exam has been documented for this visit   There is currently no information documented on the homunculus. Go to the Rheumatology activity and complete the homunculus joint exam.  Investigation: No additional findings.  Imaging: ECHOCARDIOGRAM COMPLETE  Result Date: 09/04/2022    ECHOCARDIOGRAM REPORT   Patient Name:   [name] Date of Exam: 08/31/2022 Medical Rec #:  [id number]           Height:       69.0 in Accession #:    [id number]          Weight:  339.0 lb Date of Birth:  07/07/1976           BSA:          2.585 m Patient Age:    69 years            BP:           175/118 mmHg Patient Gender: F                   HR:           71 bpm. Exam Location:  Outpatient Procedure: 2D Echo, Color Doppler and Cardiac Doppler Indications:    chest pain  History:        Patient has no prior history of Echocardiogram examinations.                 Signs/Symptoms:Murmur; Risk Factors:Hypertension.  Sonographer:    Johny Chess RDCS Referring Phys: [id number] Rex Kras  Sonographer Comments: Patient is obese. Image acquisition challenging due to patient body habitus. Global longitudinal strain was attempted. IMPRESSIONS  1. 2D Longitudinal Strain2D Strain GLS [facility]): 18.5 % 2D Strain GLS (A3C): 15.9 % 2D Strain GLS (A4C): 11.6 % 2D Strain GLS Avg:15.3 %     . Left ventricular ejection fraction, by estimation, is 55 to 60%. Left ventricular ejection fraction by PLAX is 59 %. The left ventricle has normal function. The left ventricle has no regional wall motion abnormalities. There is mild concentric left  ventricular hypertrophy. Left ventricular diastolic parameters are consistent with Grade II diastolic dysfunction (pseudonormalization). Elevated left atrial pressure. The E/e' is 14.1 Medial.  2. Right ventricular systolic function is  normal. The right ventricular size is normal. There is normal pulmonary artery systolic pressure.  3. Left atrial size was mildly dilated.  4. The mitral valve is normal in structure. Trivial mitral valve regurgitation.  5. The aortic valve is normal in structure. Aortic valve regurgitation is not visualized. No aortic stenosis is present.  6. The inferior vena cava is normal in size with greater than 50% respiratory variability, suggesting right atrial pressure of 3 mmHg. FINDINGS  Left Ventricle: 2D Longitudinal Strain2D Strain GLS [facility]): 18.5 % 2D Strain GLS (A3C): 15.9 % 2D Strain GLS (A4C): 11.6 % 2D Strain GLS Avg:15.3 %. Left ventricular ejection fraction, by estimation, is 55 to 60%. Left ventricular ejection fraction by PLAX is 59 %. The left ventricle has normal function. The left ventricle has no regional wall motion abnormalities. The left ventricular internal cavity size was normal in size. There is mild concentric left ventricular hypertrophy. Left ventricular diastolic parameters are consistent with Grade II diastolic dysfunction (pseudonormalization). Elevated left atrial pressure. The E/e' is 14.1 Medial. Right Ventricle: The right ventricular size is normal. No increase in right ventricular wall thickness. Right ventricular systolic function is normal. There is normal pulmonary artery systolic pressure. The tricuspid regurgitant velocity is 2.10 m/s, and  with an assumed right atrial pressure of 3 mmHg, the estimated right ventricular systolic pressure is 123XX123 mmHg. Left Atrium: Left atrial size was mildly dilated. Right Atrium: Right atrial size was normal in size. Pericardium: There is no evidence of pericardial effusion. Mitral Valve: The mitral valve is normal in structure. Trivial mitral valve regurgitation. Tricuspid Valve: The tricuspid valve is normal in structure. Tricuspid valve regurgitation is mild. Aortic Valve: The aortic valve is normal in structure. Aortic valve regurgitation is not  visualized. No aortic stenosis is present. Pulmonic Valve: The pulmonic valve was grossly normal. Pulmonic  valve regurgitation is trivial. Aorta: The aortic root is normal in size and structure. Venous: The inferior vena cava is normal in size with greater than 50% respiratory variability, suggesting right atrial pressure of 3 mmHg. IAS/Shunts: No atrial level shunt detected by color flow Doppler.  LEFT VENTRICLE PLAX 2D LV EF:         Left            Diastology                ventricular     LV e' medial:    5.44 cm/s                ejection        LV E/e' medial:  14.1                fraction by     LV e' lateral:   7.94 cm/s                PLAX is 59      LV E/e' lateral: 9.7                %. LVIDd:         4.80 cm         2D LVIDs:         3.30 cm         Longitudinal LV PW:         1.20 cm         Strain LV IVS:        1.20 cm         2D Strain GLS  18.5 % LVOT diam:     2.40 cm         (A2C): LV SV:         71              2D Strain GLS  15.9 % LV SV Index:   28              (A3C): LVOT Area:     4.52 cm        2D Strain GLS  11.6 %                                (A4C):                                2D Strain GLS  15.3 %                                Avg: RIGHT VENTRICLE RV Basal diam:  3.10 cm RV S prime:     11.50 cm/s TAPSE (M-mode): 1.6 cm LEFT ATRIUM             Index        RIGHT ATRIUM           Index LA diam:        4.10 cm 1.59 cm/m   RA Area:     13.90 cm LA Vol (A2C):   56.4 ml 21.81 ml/m  RA Volume:   33.20 ml  12.84 ml/m LA Vol (A4C):   74.9 ml 28.97 ml/m LA Biplane Vol: 66.2 ml 25.61 ml/m  AORTIC VALVE LVOT Vmax:   87.00 cm/s LVOT  Vmean:  53.100 cm/s LVOT VTI:    0.158 m  AORTA Ao Root diam: 3.00 cm Ao Asc diam:  3.30 cm MITRAL VALVE               TRICUSPID VALVE MV Area (PHT): 3.85 cm    TR Peak grad:   17.6 mmHg MV Decel Time: 197 msec    TR Vmax:        210.00 cm/s MV E velocity: 76.70 cm/s MV A velocity: 91.30 cm/s  SHUNTS MV E/A ratio:  0.84        Systemic VTI:  0.16 m                             Systemic Diam: 2.40 cm Adrian Prows MD Electronically signed by Adrian Prows MD Signature Date/Time: 09/04/2022/3:46:04 PM    Final     Recent Labs: Lab Results  Component Value Date   WBC 4.0 06/27/2022   HGB 13.8 08/12/2022   PLT 280 06/27/2022   NA 138 08/12/2022   K 4.7 08/12/2022   CL 104 08/12/2022   CO2 16 (L) 08/12/2022   GLUCOSE 109 (H) 08/12/2022   BUN 13 08/12/2022   CREATININE 1.09 (H) 08/12/2022   BILITOT 0.7 08/12/2022   ALKPHOS 57 08/12/2022   AST 55 (H) 08/12/2022   ALT 71 (H) 08/12/2022   PROT 7.4 08/12/2022   ALBUMIN 4.7 08/12/2022   CALCIUM 9.7 08/12/2022   GFRAA >60 08/09/2018   QFTBGOLDPLUS NEGATIVE 05/24/2022    Speciality Comments: No specialty comments available.  Procedures:  No procedures performed Allergies: Bee venom   Assessment / Plan:     Visit Diagnoses: Elevated CK - History of elevated CK for the last 4 years.  Myositis panel negative.  May 24, 2022 CK288, aldolase 8.2, HMG CR antibody negative, myositis panel negative.  Labs on August 12, 2022 showed CK of 260.  I had a detailed discussion with the patient.  She is not interested in muscle biopsy.  I will refer her to neurology for EMG nerve conduction velocity and further evaluation.  We may consider MRI of her quad muscle to evaluate this further.  Muscle cramping - History of muscle spasms in her tongue, extremities and torso per patient.  Patient will be evaluated by neurology.  Chronic pain of both shoulders -she continues to have intermittent discomfort in her shoulders.  She had good range of motion of bilateral shoulder joints without discomfort today.  Previous x-rays showed left acromioclavicular arthritis.  Trochanteric bursitis of both hips-she has intermittent discomfort in her bilateral trochanteric bursa.  She had tenderness over right trochanteric bursae today.  I would avoid cortisone injection because of elevated blood pressure.  Handout on IT band stretches was  given.  Chronic pain of both knees - H/o bilateral meniscal tear.  Knee joint aspiration at [facility] in the past.  Patient is followed by [facility].  Patient states she cannot have left knee joint surgery because of elevated blood pressure.  Pain in joint involving ankle and foot, unspecified laterality - History of intermittent swelling of the ankles and her feet per patient.  No swelling was noted on the examination.  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  HLA-B27 negative.  Abnormal SPEP - Abnormal IFE.  Patient was evaluated by oncology.  Other fatigue-she continues to have some fatigue.  Essential hypertension-blood pressure  was elevated at 152/99.  Repeat blood pressure was elevated at 142/95.  She was advised to monitor blood pressure closely and follow-up with a cardiologist.  BMI 50.36-weight loss diet and exercise was emphasized.  I also advised her to schedule an appointment with the weight management clinic.  Patient states she is thinking about it and will join them.  It will help her blood pressure and also joint pain.  Primary insomnia-good sleep hygiene was close.  Positive PPD - Since college per patient.  She gets serial chest x-rays at work.  TB Gold negative.  Vitamin D deficiency  Family history of psoriasis in sister  Orders: Orders Placed This Encounter  Procedures   Ambulatory referral to Neurology   No orders of the defined types were placed in this encounter.    Follow-Up Instructions: Return in about 4 months (around 01/13/2023) for elevated CK.   Bo Merino, MD  Note - This record has been created using Editor, commissioning.  Chart creation errors have been sought, but may not always  have been located. Such creation errors do not reflect on  the standard of medical care.

## 2022-09-04 LAB — ECHOCARDIOGRAM COMPLETE
Area-P 1/2: 3.85 cm2
S' Lateral: 3.3 cm

## 2022-09-05 ENCOUNTER — Ambulatory Visit: Payer: BC Managed Care – PPO | Admitting: Cardiology

## 2022-09-07 LAB — CELIAC PNL 2 RFLX ENDOMYSIAL AB TTR
(tTG) Ab, IgA: <1 U/mL
(tTG) Ab, IgG: <1 U/mL
Endomysial Ab IgA: NEGATIVE
Gliadin IgA: <1 U/mL
Gliadin IgG: <1 U/mL
Immunoglobulin A: 113 mg/dL (ref 47–310)

## 2022-09-07 LAB — METANEPHRINES, URINE, 24 HOUR
Metaneph Total, Ur: 73 ug/L
Metanephrines, 24H Ur: 117 ug/24 hr (ref 36–209)
Normetanephrine, 24H Ur: 502 ug/24 hr (ref 131–612)
Normetanephrine, Ur: 314 ug/L

## 2022-09-07 LAB — 5 HIAA, QUANTITATIVE, URINE, 24 HOUR

## 2022-09-07 NOTE — Progress Notes (Signed)
Per ST.

## 2022-09-08 ENCOUNTER — Ambulatory Visit: Payer: BC Managed Care – PPO | Admitting: Cardiology

## 2022-09-08 ENCOUNTER — Encounter: Payer: Self-pay | Admitting: Cardiology

## 2022-09-08 VITALS — BP 160/99 | HR 88 | Ht 69.0 in | Wt 335.4 lb

## 2022-09-08 DIAGNOSIS — E7800 Pure hypercholesterolemia, unspecified: Secondary | ICD-10-CM

## 2022-09-08 DIAGNOSIS — D472 Monoclonal gammopathy: Secondary | ICD-10-CM

## 2022-09-08 DIAGNOSIS — R0683 Snoring: Secondary | ICD-10-CM

## 2022-09-08 DIAGNOSIS — I10 Essential (primary) hypertension: Secondary | ICD-10-CM

## 2022-09-08 DIAGNOSIS — R252 Cramp and spasm: Secondary | ICD-10-CM

## 2022-09-08 MED ORDER — LABETALOL HCL 200 MG PO TABS: 200.0000 mg | ORAL_TABLET | Freq: Two times a day (BID) | ORAL | 0 refills | Status: DC

## 2022-09-08 NOTE — Progress Notes (Signed)
Noted  

## 2022-09-08 NOTE — Progress Notes (Signed)
d  ID:  Karen, Salazar 12-Aug-1975, MRN [id number]  PCP:  Eugenia Pancoast, FNP  Cardiologist:  Rex Kras, DO, [facility] (established care 08/12/2022)  Date: 09/08/22 Last Office Visit: 08/12/2022  Chief Complaint  Patient presents with   Follow-up   Results   Hypertension    HPI  [name] is a 47 y.o. 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   Referred to the practice for evaluation of hypertension and a cardiac murmur.  Hypertension: Diagnosed at the age of 62-41 as per patient.  She has been struggling with generalized muscle cramps and therefore her allergy lists include HCTZ, metoprolol, amlodipine, spironolactone, and hydralazine.  Other contributing factors include but not limited to use of NSAIDs (Aleve and Motrin), eating out often, and possible undiagnosed sleep apnea.  At the last office visit I advised her that her muscle cramps may be due to another reason such as rheumatologic and not associated with all of various antihypertensive medications.  She was agreeable to start low-dose hydralazine 25 mg p.o. 3 times daily.  Home blood pressure log reviewed.  She will be seeing rheumatology on September 14, 2022 and is currently working with hematology oncology with a working diagnosis of IgG lambda monoclonal gammopathy (next appointment June 2024, and establish care with new PCP.  Since last office visit she has had multiple blood work and echocardiogram results reviewed with her and noted below for further reference.   FUNCTIONAL STATUS: No structured exercise program or daily routine (hip and knee pain)   ALLERGIES: Allergies  Allergen Reactions   Bee Venom Anaphylaxis    MEDICATION LIST PRIOR TO VISIT: Current Meds  Medication Sig   EPINEPHrine 0.3 mg/0.3 mL IJ SOAJ injection Inject 0.3 mg into the muscle as  needed for anaphylaxis.   hydrALAZINE (APRESOLINE) 25 MG tablet Take 1 tablet (25 mg total) by mouth 3 (three) times daily.   labetalol (NORMODYNE) 200 MG tablet Take 1 tablet (200 mg total) by mouth 2 (two) times daily.   levonorgestrel (MIRENA) 20 MCG/24HR IUD 1 each by Intrauterine route once.   tiZANidine (ZANAFLEX) 4 MG tablet Take 1/2 to 1 tablet po qhs prn muscle spasm   zolpidem (AMBIEN CR) 6.25 MG CR tablet Take 1 tablet (6.25 mg total) by mouth at bedtime as needed.     PAST MEDICAL HISTORY: Past Medical History:  Diagnosis Date   Hypertension    Murmur     PAST SURGICAL HISTORY: Past Surgical History:  Procedure Laterality Date   ABDOMINOPLASTY     ACHILLES TENDON SURGERY  06/08/2012   Procedure: ACHILLES TENDON REPAIR;  Surgeon: Newt Minion, MD;  Location: [facility];  Service: Orthopedics;  Laterality: Right;  Right Achilles Reconstruction   APPENDECTOMY     CHOLECYSTECTOMY     HYSTEROSCOPY WITH D & C N/A 03/06/2014   Procedure: DILATATION AND CURETTAGE With IUD Removal;  Surgeon: Marvene Staff, MD;  Location: [facility];  Service: Gynecology;  Laterality: N/A;   KNEE SURGERY     right knee arthroscropic    FAMILY HISTORY: The patient family history includes Autism in her sister; Diabetes in her father and mother; Healthy in her daughter, daughter, and son; Hypertension in her father, mother, and sister.  SOCIAL HISTORY:  The patient  reports that she has never smoked. She has never been exposed to tobacco smoke. She has never used smokeless tobacco. She reports  current alcohol use. She reports that she does not use drugs.  REVIEW OF SYSTEMS: Review of Systems  Constitutional: Positive for malaise/fatigue.       Obesity  Cardiovascular:  Negative for chest pain, claudication, dyspnea on exertion, irregular heartbeat, leg swelling, near-syncope, orthopnea, palpitations, paroxysmal nocturnal dyspnea and syncope.  Respiratory:  Positive for snoring. Negative for  shortness of breath.   Hematologic/Lymphatic: Negative for bleeding problem.  Musculoskeletal:  Positive for joint pain, muscle cramps and stiffness. Negative for myalgias.  Neurological:  Positive for headaches. Negative for dizziness and light-headedness.    PHYSICAL EXAM:    09/08/2022    2:06 PM 09/08/2022    1:27 PM 08/30/2022    1:55 PM  Vitals with BMI  Height  5' 9"$  5' 9"$   Weight  335 lbs 6 oz 339 lbs  BMI  99991111 0000000  Systolic 0000000 0000000 0000000  Diastolic 99 [id number]  Pulse 88 80 98    Physical Exam  Constitutional: No distress.  Age appropriate, hemodynamically stable.   Neck: No JVD present.  Cardiovascular: Normal rate, regular rhythm, S1 normal, S2 normal, intact distal pulses and normal pulses. Exam reveals no gallop, no S3 and no S4.  Pulses:      Dorsalis pedis pulses are 2+ on the right side and 2+ on the left side.       Posterior tibial pulses are 2+ on the right side and 2+ on the left side.  Pulmonary/Chest: Effort normal and breath sounds normal. No stridor. She has no wheezes. She has no rales.  Abdominal: Soft. Bowel sounds are normal. She exhibits no distension. There is no abdominal tenderness.  Abdominal obesity  Musculoskeletal:        General: No edema.     Cervical back: Neck supple.  Neurological: She is alert and oriented to person, place, and time. She has intact cranial nerves (2-12).  Skin: Skin is warm and moist.    RADIOLOGY: Renal ultrasound: 01/03/2022: 1. Normal ultrasound appearance of the kidneys 2. The liver appears echogenic suggesting hepatic steatosis, this may be correlated with LFTs  DG bone survey: 07/06/2022 1. No worrisome lytic or sclerotic lesions. 2. Reversal the normal cervical lordosis. 3. 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   CARDIAC DATABASE: EKG: 08/12/2022: Sinus rhythm, 75 bpm, LAE, nonspecific T wave abnormality.  Echocardiogram: 08/31/2022 2D Longitudinal Strain2D Strain GLS [facility]): 18.5 %  2D Strain GLS (A3C):  15.9 % 2D Strain GLS (A4C): 11.6 % 2D Strain GLS Avg:15.3 %      . Left ventricular ejection fraction, by estimation, is 55 to 60%.  Left ventricular ejection fraction by PLAX is 59 %. The left ventricle has  normal function. The left ventricle has no regional wall motion  abnormalities. There is mild concentric left   ventricular hypertrophy. Left ventricular diastolic parameters are  consistent with Grade II diastolic dysfunction (pseudonormalization).  Elevated left atrial pressure. The E/e' is 14.1 Medial.   2. Right ventricular systolic function is normal. The right ventricular  size is normal. There is normal pulmonary artery systolic pressure.   3. Left atrial size was mildly dilated.   4. The mitral valve is normal in structure. Trivial mitral valve  regurgitation.   5. The aortic valve is normal in structure. Aortic valve regurgitation is  not visualized. No aortic stenosis is present.   6. The inferior vena cava is normal in size with greater than 50%  respiratory variability, suggesting right atrial pressure  of 3 mmHg.   Stress Testing: No results found for this or any previous visit from the past 1095 days.   Heart Catheterization: None  LABORATORY DATA:    Latest Ref Rng & Units 08/12/2022   10:22 AM 06/27/2022    1:55 PM 05/24/2022    8:55 AM  CBC  WBC 4.0 - 10.5 K/uL  4.0  4.5   Hemoglobin 11.1 - 15.9 g/dL 13.8  13.3  13.6   Hematocrit 34.0 - 46.6 % 42.6  40.5  40.7   Platelets 150 - 400 K/uL  280  328        Latest Ref Rng & Units 08/12/2022   10:22 AM 06/27/2022    1:55 PM 05/24/2022    8:55 AM  CMP  Glucose 70 - 99 mg/dL 109  108  90   BUN 6 - 24 mg/dL 13  9  13   $ Creatinine 0.57 - 1.00 mg/dL 1.09  0.91  0.94   Sodium 134 - 144 mmol/L 138  137  138   Potassium 3.5 - 5.2 mmol/L 4.7  3.9  4.7   Chloride 96 - 106 mmol/L 104  107  108   CO2 20 - 29 mmol/L 16  24  24   $ Calcium 8.7 - 10.2 mg/dL 9.7  9.4  9.3   Total Protein 6.0 -  8.5 g/dL 7.4  7.3  7.1    7.0   Total Bilirubin 0.0 - 1.2 mg/dL 0.7  0.6  0.3   Alkaline Phos 44 - 121 IU/L 57  52    AST 0 - 40 IU/L 55  38  27   ALT 0 - 32 IU/L 71  55  46     Lipid Panel  Lab Results  Component Value Date   CHOL 202 (H) 08/12/2022   HDL 41 08/12/2022   LDLCALC 137 (H) 08/12/2022   LDLDIRECT 144 (H) 08/12/2022   TRIG 135 08/12/2022   CHOLHDL 5 10/08/2021   No components found for: "NTPROBNP" No results for input(s): "PROBNP" in the last 8760 hours. Recent Labs    10/08/21 1025 05/24/22 0855 08/12/22 1022  TSH 3.73 3.40 2.490    BMP Recent Labs    05/24/22 0855 06/27/22 1355 08/12/22 1022  NA 138 137 138  K 4.7 3.9 4.7  CL 108 107 104  CO2 24 24 16*  GLUCOSE 90 108* 109*  BUN 13 9 13  $ CREATININE 0.94 0.91 1.09*  CALCIUM 9.3 9.4 9.7  GFRNONAA  --  >60  --     HEMOGLOBIN A1C Lab Results  Component Value Date   HGBA1C 5.8 08/30/2022    IMPRESSION:    ICD-10-CM   1. Essential hypertension  I10 labetalol (NORMODYNE) 200 MG tablet    2. Pure hypercholesterolemia  E78.00     3. Muscle cramps  R25.2     4. Snoring  R06.83     5. Gammopathy  D47.2     6. Class 3 severe obesity due to excess calories with serious comorbidity and body mass index (BMI) of 45.0 to 49.9 in adult [facility])  E66.01    Z68.42        RECOMMENDATIONS: [name] is a 47 y.o. 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   Essential hypertension Uncontrolled. She has been struggling with generalized muscle cramps and therefore her allergy lists include HCTZ, metoprolol, amlodipine, spironolactone, and hydralazine.  At the last office visit started her on hydralazine 25 mg p.o. 3 times daily-tolerated it but did have some myalgias but continues to take it. Start labetalol 200 mg p.o. twice daily. Will hold off on up titration of  hydralazine as one of the potential side effect is drug-induced lupus & she already has generalized myalgias with elevated CK levels. Labs from August 12, 2022 independently reviewed. Plasma aldosterone and renin levels within normal limits Aldosterone to renin ratio within normal limits. A.m. cortisol lower limit of normal. TSH within normal limits Calcium levels within normal limits Plasma metanephrines within normal limits 24-hour urine metanephrines and normetanephrine's within normal limits Urine protein creatinine ratio within normal limits She was referred to sleep medicine as well; however, the consult still pending.  Will reach out to requesting provider & have it arranged  Muscle cramps Currently follows up with rheumatology. Most recent total CK levels elevated  Mixed hyperlipidemia LDL levels are not well-controlled as of January 2024 Estimated 10-year risk of ASCVD approximately 5.3% as per current parameters  Currently not on statin therapy. Patient would like to avoid pharmacological therapy due to her baseline myalgias. Located on the importance of reducing foods that are high in lipids and triglycerides. Monitor for now.  FINAL MEDICATION LIST END OF ENCOUNTER: Meds ordered this encounter  Medications   labetalol (NORMODYNE) 200 MG tablet    Sig: Take 1 tablet (200 mg total) by mouth 2 (two) times daily.    Dispense:  60 tablet    Refill:  0     Current Outpatient Medications:    EPINEPHrine 0.3 mg/0.3 mL IJ SOAJ injection, Inject 0.3 mg into the muscle as needed for anaphylaxis., Disp: 1 each, Rfl: 0   hydrALAZINE (APRESOLINE) 25 MG tablet, Take 1 tablet (25 mg total) by mouth 3 (three) times daily., Disp: 90 tablet, Rfl: 1   labetalol (NORMODYNE) 200 MG tablet, Take 1 tablet (200 mg total) by mouth 2 (two) times daily., Disp: 60 tablet, Rfl: 0   levonorgestrel (MIRENA) 20 MCG/24HR IUD, 1 each by Intrauterine route once., Disp: , Rfl:    tiZANidine (ZANAFLEX)  4 MG tablet, Take 1/2 to 1 tablet po qhs prn muscle spasm, Disp: 30 tablet, Rfl: 0   zolpidem (AMBIEN CR) 6.25 MG CR tablet, Take 1 tablet (6.25 mg total) by mouth at bedtime as needed., Disp: 30 tablet, Rfl: 0  No orders of the defined types were placed in this encounter.   There are no Patient Instructions on file for this visit.   --Continue cardiac medications as reconciled in final medication list. --Return in about 6 weeks (around 10/20/2022) for Follow up, BP. or sooner if needed. --Continue follow-up with your primary care physician regarding the management of your other chronic comorbid conditions.  Patient's questions and concerns were addressed to her satisfaction. She voices understanding of the instructions provided during this encounter.   This note was created using a voice recognition software as a result there may be grammatical errors inadvertently enclosed that do not reflect the nature of this encounter. Every attempt is made to correct such errors.  Rex Kras, [place], [facility]  Pager: [phone] Office: [phone]

## 2022-09-14 ENCOUNTER — Ambulatory Visit: Payer: BC Managed Care – PPO | Attending: Rheumatology | Admitting: Rheumatology

## 2022-09-14 ENCOUNTER — Encounter: Payer: Self-pay | Admitting: Rheumatology

## 2022-09-14 VITALS — BP 142/95 | HR 69 | Resp 14 | Ht 69.0 in | Wt 341.0 lb

## 2022-09-14 DIAGNOSIS — M25561 Pain in right knee: Secondary | ICD-10-CM

## 2022-09-14 DIAGNOSIS — M7062 Trochanteric bursitis, left hip: Secondary | ICD-10-CM

## 2022-09-14 DIAGNOSIS — M25512 Pain in left shoulder: Secondary | ICD-10-CM

## 2022-09-14 DIAGNOSIS — R5383 Other fatigue: Secondary | ICD-10-CM

## 2022-09-14 DIAGNOSIS — M25462 Effusion, left knee: Secondary | ICD-10-CM

## 2022-09-14 DIAGNOSIS — Z6841 Body Mass Index (BMI) 40.0 and over, adult: Secondary | ICD-10-CM

## 2022-09-14 DIAGNOSIS — R252 Cramp and spasm: Secondary | ICD-10-CM

## 2022-09-14 DIAGNOSIS — M7061 Trochanteric bursitis, right hip: Secondary | ICD-10-CM | POA: Diagnosis not present

## 2022-09-14 DIAGNOSIS — E559 Vitamin D deficiency, unspecified: Secondary | ICD-10-CM

## 2022-09-14 DIAGNOSIS — I10 Essential (primary) hypertension: Secondary | ICD-10-CM

## 2022-09-14 DIAGNOSIS — G8929 Other chronic pain: Secondary | ICD-10-CM

## 2022-09-14 DIAGNOSIS — Z840 Family history of diseases of the skin and subcutaneous tissue: Secondary | ICD-10-CM

## 2022-09-14 DIAGNOSIS — R748 Abnormal levels of other serum enzymes: Secondary | ICD-10-CM

## 2022-09-14 DIAGNOSIS — R778 Other specified abnormalities of plasma proteins: Secondary | ICD-10-CM

## 2022-09-14 DIAGNOSIS — F5101 Primary insomnia: Secondary | ICD-10-CM

## 2022-09-14 DIAGNOSIS — M25511 Pain in right shoulder: Secondary | ICD-10-CM | POA: Diagnosis not present

## 2022-09-14 DIAGNOSIS — R7611 Nonspecific reaction to tuberculin skin test without active tuberculosis: Secondary | ICD-10-CM

## 2022-09-14 DIAGNOSIS — M533 Sacrococcygeal disorders, not elsewhere classified: Secondary | ICD-10-CM

## 2022-09-14 DIAGNOSIS — M25562 Pain in left knee: Secondary | ICD-10-CM

## 2022-09-14 DIAGNOSIS — M25579 Pain in unspecified ankle and joints of unspecified foot: Secondary | ICD-10-CM

## 2022-09-14 NOTE — Patient Instructions (Signed)
Iliotibial Band Syndrome Rehab Ask your health care provider which exercises are safe for you. Do exercises exactly as told by your health care provider and adjust them as directed. It is normal to feel mild stretching, pulling, tightness, or discomfort as you do these exercises. Stop right away if you feel sudden pain or your pain gets significantly worse. Do not begin these exercises until told by your health care provider. Stretching and range-of-motion exercises These exercises warm up your muscles and joints and improve the movement and flexibility of your hip and pelvis. Quadriceps stretch, prone  Lie on your abdomen (prone position) on a firm surface, such as a bed or padded floor. Bend your left / right knee and reach back to hold your ankle or pant leg. If you cannot reach your ankle or pant leg, loop a belt around your foot and grab the belt instead. Gently pull your heel toward your buttocks. Your knee should not slide out to the side. You should feel a stretch in the front of your thigh and knee (quadriceps). Hold this position for __________ seconds. Repeat __________ times. Complete this exercise __________ times a day. Iliotibial band stretch An iliotibial band is a strong band of muscle tissue that runs from the outer side of your hip to the outer side of your thigh and knee. Lie on your side with your left / right leg in the top position. Bend both of your knees and grab your left / right ankle. Stretch out your bottom arm to help you balance. Slowly bring your top knee back so your thigh goes behind your trunk. Slowly lower your top leg toward the floor until you feel a gentle stretch on the outside of your left / right hip and thigh. If you do not feel a stretch and your knee will not fall farther, place the heel of your other foot on top of your knee and pull your knee down toward the floor with your foot. Hold this position for __________ seconds. Repeat __________ times.  Complete this exercise __________ times a day. Strengthening exercises These exercises build strength and endurance in your hip and pelvis. Endurance is the ability to use your muscles for a long time, even after they get tired. Straight leg raises, side-lying This exercise strengthens the muscles that rotate the leg at the hip and move it away from your body (hip abductors). Lie on your side with your left / right leg in the top position. Lie so your head, shoulder, hip, and knee line up. You may bend your bottom knee to help you balance. Roll your hips slightly forward so your hips are stacked directly over each other and your left / right knee is facing forward. Tense the muscles in your outer thigh and lift your top leg 4-6 inches (10-15 cm). Hold this position for __________ seconds. Slowly lower your leg to return to the starting position. Let your muscles relax completely before doing another repetition. Repeat __________ times. Complete this exercise __________ times a day. Leg raises, prone This exercise strengthens the muscles that move the hips backward (hip extensors). Lie on your abdomen (prone position) on your bed or a firm surface. You can put a pillow under your hips if that is more comfortable for your lower back. Bend your left / right knee so your foot is straight up in the air. Squeeze your buttocks muscles and lift your left / right thigh off the bed. Do not let your back arch. Tense   your thigh muscle as hard as you can without increasing any knee pain. Hold this position for __________ seconds. Slowly lower your leg to return to the starting position and allow it to relax completely. Repeat __________ times. Complete this exercise __________ times a day. Hip hike Stand sideways on a bottom step. Stand on your left / right leg with your other foot unsupported next to the step. You can hold on to a railing or wall for balance if needed. Keep your knees straight and your  torso square. Then lift your left / right hip up toward the ceiling. Slowly let your left / right hip lower toward the floor, past the starting position. Your foot should get closer to the floor. Do not lean or bend your knees. Repeat __________ times. Complete this exercise __________ times a day. This information is not intended to replace advice given to you by your health care provider. Make sure you discuss any questions you have with your health care provider. Document Revised: 09/25/2019 Document Reviewed: 09/25/2019 Elsevier Patient Education  2023 [employer]  

## 2022-09-21 ENCOUNTER — Ambulatory Visit: Payer: BC Managed Care – PPO | Admitting: Rheumatology

## 2022-09-27 ENCOUNTER — Telehealth: Payer: Self-pay

## 2022-09-27 NOTE — Telephone Encounter (Signed)
Patient is calling to let us know about her recent BP reading. After adding labetalol, she says she hasn't seen much progress.   The last four days are as followed 09/23/22- 158/105 in the am 09/24/22- 167/105 in the am     171/118 in the pm 09/25/22- 165/96 in the am     152/99 in the pm 09/26/22- 163/94 in the am      143/101 in the pm

## 2022-09-28 ENCOUNTER — Other Ambulatory Visit: Payer: Self-pay

## 2022-09-28 DIAGNOSIS — I10 Essential (primary) hypertension: Secondary | ICD-10-CM

## 2022-09-28 MED ORDER — LABETALOL HCL 200 MG PO TABS: 200.0000 mg | ORAL_TABLET | Freq: Three times a day (TID) | ORAL | 0 refills | Status: DC

## 2022-09-28 NOTE — Telephone Encounter (Signed)
Increase labetalol to '200mg'$  po tid.  Update the [facility] Did she get a sleep study?  [name] [place], DO, [facility]

## 2022-09-28 NOTE — Telephone Encounter (Signed)
Let the patient know about the change to take three times a day instead of two. She is wanting to know if she should still be taking the hydralazine three times a day. Updated the labetalol in the [facility]. She has NOT gone for a sleep study, transferred her to [place] to figure out the referral for the sleep study and to get her scheduled for her 6 week f/u.

## 2022-09-30 ENCOUNTER — Other Ambulatory Visit: Payer: Self-pay | Admitting: Family

## 2022-09-30 DIAGNOSIS — M62838 Other muscle spasm: Secondary | ICD-10-CM

## 2022-09-30 NOTE — Telephone Encounter (Signed)
Refill request for tiZANidine (ZANAFLEX) 4 MG tablet. [facility]- 08/30/22; [facility]- 08/30/22 (30 tabs/no refills); [facility]- 02/28/23. Ok to send medication?

## 2022-10-05 ENCOUNTER — Other Ambulatory Visit: Payer: Self-pay | Admitting: Cardiology

## 2022-10-05 DIAGNOSIS — I10 Essential (primary) hypertension: Secondary | ICD-10-CM

## 2022-10-05 NOTE — Telephone Encounter (Signed)
Increase Labetalol to TID.  Have her follow up w/ rheumatology given her myalgias/muscle aches and pains. Please make sure she follows up with sleep medicine as well. Have her keep a log of her blood pressures.  Luisdavid Hamblin [place], DO, [facility]

## 2022-10-05 NOTE — Telephone Encounter (Signed)
Gave patient the information on the labetalol. She has followed up with rheumatology and will call sleep medicine. She agreed to keep the BP log as well to bring with her to her appt.

## 2022-10-10 ENCOUNTER — Telehealth: Payer: Self-pay

## 2022-10-10 NOTE — Telephone Encounter (Signed)
We received a refill request faxed from [facility]; the refill request was for ibuprofen '800mg'$  last fille # 90 on 12/31/21. Pt established care with Eugenia Pancoast FNP on 08/30/2022 and ibuprofen is not on current med list with note pt completed course. Last refilled # 90 on 12/31/21. I spoke with Lennette Bihari at [facility] [employer] and he said to disregard refill request was auto refill request. I had tried to call pt but unable to reach pt. Sending note to Red Christians FNP as Juluis Rainier.

## 2022-10-13 ENCOUNTER — Other Ambulatory Visit: Payer: Self-pay

## 2022-10-13 NOTE — Telephone Encounter (Signed)
Pharmacy requested refill for ibuprofen (ADVIL) 800 MG tablet. I didn't see where you have filled this medication before. [facility]- 08/30/22 [facility]- 09/13/22 [facility]- 02/28/23

## 2022-10-18 ENCOUNTER — Telehealth: Payer: Self-pay | Admitting: Family

## 2022-10-18 NOTE — Telephone Encounter (Signed)
Patient called in stating that she has received nausea patches in the past,last year,before she went on a trip. She is going on a cruise in a few weeks and would like to know if she could receive a rx for some?

## 2022-10-18 NOTE — Telephone Encounter (Signed)
Does patient need to be seen for nausea patches?

## 2022-10-19 ENCOUNTER — Other Ambulatory Visit: Payer: Self-pay | Admitting: Family

## 2022-10-19 ENCOUNTER — Telehealth: Payer: Self-pay

## 2022-10-19 DIAGNOSIS — T753XXA Motion sickness, initial encounter: Secondary | ICD-10-CM

## 2022-10-19 MED ORDER — SCOPOLAMINE 1 MG/3DAYS TD PT72: 1.0000 | MEDICATED_PATCH | TRANSDERMAL | 12 refills | Status: DC

## 2022-10-19 NOTE — Telephone Encounter (Signed)
Can you call the pharmacy on why she need a PA for hydralazine?  Dr. Terri Skains    Dr. Terri Skains

## 2022-10-19 NOTE — Telephone Encounter (Signed)
[facility] needs PA for hydralazine

## 2022-10-19 NOTE — Telephone Encounter (Signed)
Patient is requesting a medication. Her insurance is requiring a prior auth for hydralazine and she is upset she is having to wait for approval for the medication. She is upset and worried about having a stroke due to her uncontrolled BP.

## 2022-10-20 ENCOUNTER — Other Ambulatory Visit: Payer: Self-pay

## 2022-10-20 DIAGNOSIS — I10 Essential (primary) hypertension: Secondary | ICD-10-CM

## 2022-10-20 MED ORDER — HYDRALAZINE HCL 25 MG PO TABS: 25.0000 mg | ORAL_TABLET | Freq: Three times a day (TID) | ORAL | 1 refills | Status: DC

## 2022-10-20 NOTE — Telephone Encounter (Signed)
It has been approved. She changed insurance and it not on their preferred list so they were requiring the PA but all is well because it is approved as of this morning.

## 2022-10-20 NOTE — Telephone Encounter (Signed)
It is $4 cash

## 2022-10-21 ENCOUNTER — Encounter: Payer: Self-pay | Admitting: Cardiology

## 2022-10-21 ENCOUNTER — Ambulatory Visit: Payer: BC Managed Care – PPO | Admitting: Cardiology

## 2022-10-21 VITALS — BP 158/100 | HR 66 | Resp 19 | Ht 69.0 in | Wt 339.4 lb

## 2022-10-21 DIAGNOSIS — I10 Essential (primary) hypertension: Secondary | ICD-10-CM

## 2022-10-21 DIAGNOSIS — R0683 Snoring: Secondary | ICD-10-CM

## 2022-10-21 DIAGNOSIS — Z6841 Body Mass Index (BMI) 40.0 and over, adult: Secondary | ICD-10-CM

## 2022-10-21 DIAGNOSIS — R252 Cramp and spasm: Secondary | ICD-10-CM

## 2022-10-21 DIAGNOSIS — E7800 Pure hypercholesterolemia, unspecified: Secondary | ICD-10-CM

## 2022-10-21 DIAGNOSIS — D472 Monoclonal gammopathy: Secondary | ICD-10-CM

## 2022-10-21 MED ORDER — ISOSORBIDE DINITRATE 20 MG PO TABS: 20.0000 mg | ORAL_TABLET | Freq: Three times a day (TID) | ORAL | 0 refills | Status: DC

## 2022-10-21 NOTE — Progress Notes (Signed)
d  ID:  [name], [name] 22-Apr-1976, MRN [id number]  PCP:  Eugenia Pancoast, FNP  Cardiologist:  Rex Kras, DO, [facility] (established care 08/12/2022)  Date: 10/21/22 Last Office Visit: 09/08/2022  Chief Complaint  Patient presents with   Hypertension   Follow-up    6 weeks    HPI  [name] is a 47 y.o. 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   Referred to the practice for evaluation of hypertension and a cardiac murmur.  Hypertension: Diagnosed at the age of 74-41 as per patient.  She has been struggling with generalized muscle cramps and therefore her allergy lists include HCTZ, metoprolol, amlodipine, spironolactone, and hydralazine.  Other contributing factors include but not limited to use of NSAIDs (Aleve and Motrin), eating out often, and possible undiagnosed sleep apnea.  Patient presents today for follow-up of blood pressure management.  She is tolerated the initiation of labetalol well at the last office visit.  She is on a very low-dose of hydralazine she is doing well.  Her numbers are improving but still labile.  The blood pressure log that she provides today notes SBP ranging anywhere from 128-170 mmHg and DBP ranging between 82-107 mmHg.  She has symptoms of orthostasis after prolonged periods of sitting but orthostatic vital signs today are within normal limits.  She still continues to feel tired, fatigued, myalgias at baseline.  She follows up with rheumatology as well as hematology for the working diagnosis of IgG lambda monoclonal gammopathy (next visit June 2024).   FUNCTIONAL STATUS: No structured exercise program or daily routine (hip and knee pain)   ALLERGIES: Allergies  Allergen Reactions   Bee Venom Anaphylaxis    MEDICATION LIST PRIOR TO VISIT: Current Meds  Medication Sig   EPINEPHrine 0.3 mg/0.3  mL IJ SOAJ injection Inject 0.3 mg into the muscle as needed for anaphylaxis.   hydrALAZINE (APRESOLINE) 25 MG tablet Take 1 tablet (25 mg total) by mouth 3 (three) times daily.   ibuprofen (ADVIL) 800 MG tablet Take 800 mg by mouth every 8 (eight) hours as needed.   isosorbide dinitrate (ISORDIL) 20 MG tablet Take 1 tablet (20 mg total) by mouth 3 (three) times daily.   labetalol (NORMODYNE) 200 MG tablet Take 1 tablet (200 mg total) by mouth in the morning, at noon, and at bedtime.   levonorgestrel (MIRENA) 20 MCG/24HR IUD 1 each by Intrauterine route once.   scopolamine (TRANSDERM-SCOP) 1 MG/3DAYS Place 1 patch (1.5 mg total) onto the skin every 3 (three) days.   tiZANidine (ZANAFLEX) 4 MG tablet TAKE 1/2 TO 1 TABLET BY MOUTH AT BEDTIME AS NEEDED FOR MUSCLE SPASM   zolpidem (AMBIEN CR) 6.25 MG CR tablet Take 1 tablet (6.25 mg total) by mouth at bedtime as needed.     PAST MEDICAL HISTORY: Past Medical History:  Diagnosis Date   Hypertension    Murmur     PAST SURGICAL HISTORY: Past Surgical History:  Procedure Laterality Date   ABDOMINOPLASTY     ACHILLES TENDON SURGERY  06/08/2012   Procedure: ACHILLES TENDON REPAIR;  Surgeon: Newt Minion, MD;  Location: [place];  Service: Orthopedics;  Laterality: Right;  Right Achilles Reconstruction   APPENDECTOMY     CHOLECYSTECTOMY     HYSTEROSCOPY WITH D & C N/A 03/06/2014   Procedure: DILATATION AND CURETTAGE With IUD Removal;  Surgeon: Marvene Staff, MD;  Location: [facility];  Service:  Gynecology;  Laterality: N/A;   KNEE SURGERY     right knee arthroscropic    FAMILY HISTORY: The patient family history includes Autism in her sister; Diabetes in her father and mother; Healthy in her daughter, daughter, and son; Hypertension in her father, mother, and sister.  SOCIAL HISTORY:  The patient  reports that she has never smoked. She has never been exposed to tobacco smoke. She has never used smokeless tobacco. She reports current alcohol  use. She reports that she does not use drugs.  REVIEW OF SYSTEMS: Review of Systems  Constitutional: Positive for malaise/fatigue.       Obesity  Cardiovascular:  Negative for chest pain, claudication, dyspnea on exertion, irregular heartbeat, leg swelling, near-syncope, orthopnea, palpitations, paroxysmal nocturnal dyspnea and syncope.  Respiratory:  Positive for snoring. Negative for shortness of breath.   Hematologic/Lymphatic: Negative for bleeding problem.  Musculoskeletal:  Positive for joint pain, muscle cramps and stiffness. Negative for myalgias.  Neurological:  Negative for dizziness and light-headedness.    PHYSICAL EXAM:    10/21/2022    9:59 AM 09/14/2022   10:29 AM 09/14/2022   10:02 AM  Vitals with BMI  Height 5\' 9"   5\' 9"   Weight 339 lbs 6 oz  341 lbs  BMI A999333  [id number]  Systolic 0000000 A999333 0000000  Diastolic 123XX123 95 99  Pulse 66  69   Orthostatic VS for the past 72 hrs (Last 3 readings):  Orthostatic BP Patient Position BP Location Cuff Size Orthostatic Pulse  10/21/22 1043 (!) 151/102 Sitting Left Arm Large 71  10/21/22 1009 (!) 141/92 Standing Left Arm Large 72  10/21/22 1008 (!) 149/97 Sitting Left Arm Large 63    Physical Exam  Constitutional: No distress.  Age appropriate, hemodynamically stable.   Neck: No JVD present.  Cardiovascular: Normal rate, regular rhythm, S1 normal, S2 normal, intact distal pulses and normal pulses. Exam reveals no gallop, no S3 and no S4.  Pulses:      Dorsalis pedis pulses are 2+ on the right side and 2+ on the left side.       Posterior tibial pulses are 2+ on the right side and 2+ on the left side.  Pulmonary/Chest: Effort normal and breath sounds normal. No stridor. She has no wheezes. She has no rales.  Abdominal: Soft. Bowel sounds are normal. She exhibits no distension. There is no abdominal tenderness.  Abdominal obesity  Musculoskeletal:        General: No edema.     Cervical back: Neck supple.  Neurological: She is alert  and oriented to person, place, and time. She has intact cranial nerves (2-12).  Skin: Skin is warm and moist.    RADIOLOGY: Renal ultrasound: 01/03/2022: 1. Normal ultrasound appearance of the kidneys 2. The liver appears echogenic suggesting hepatic steatosis, this may be correlated with LFTs  DG bone survey: 07/06/2022 1. No worrisome lytic or sclerotic lesions. 2. Reversal the normal cervical lordosis. 3. 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   CARDIAC DATABASE: EKG: 08/12/2022: Sinus rhythm, 75 bpm, LAE, nonspecific T wave abnormality.  Echocardiogram: 08/31/2022 2D Longitudinal Strain2D Strain GLS [facility]): 18.5 % 2D Strain GLS (A3C):  15.9 % 2D Strain GLS (A4C): 11.6 % 2D Strain GLS Avg:15.3 %      . Left ventricular ejection fraction, by estimation, is 55 to 60%.  Left ventricular ejection fraction by PLAX is 59 %. The left ventricle has  normal function. The left ventricle has no regional wall  motion  abnormalities. There is mild concentric left   ventricular hypertrophy. Left ventricular diastolic parameters are  consistent with Grade II diastolic dysfunction (pseudonormalization).  Elevated left atrial pressure. The E/e' is 14.1 Medial.   2. Right ventricular systolic function is normal. The right ventricular  size is normal. There is normal pulmonary artery systolic pressure.   3. Left atrial size was mildly dilated.   4. The mitral valve is normal in structure. Trivial mitral valve  regurgitation.   5. The aortic valve is normal in structure. Aortic valve regurgitation is  not visualized. No aortic stenosis is present.   6. The inferior vena cava is normal in size with greater than 50%  respiratory variability, suggesting right atrial pressure of 3 mmHg.   Stress Testing: No results found for this or any previous visit from the past 1095 days.   Heart Catheterization: None  LABORATORY DATA:    Latest Ref Rng & Units 08/12/2022   10:22 AM  06/27/2022    1:55 PM 05/24/2022    8:55 AM  CBC  WBC 4.0 - 10.5 K/uL  4.0  4.5   Hemoglobin 11.1 - 15.9 g/dL 13.8  13.3  13.6   Hematocrit 34.0 - 46.6 % 42.6  40.5  40.7   Platelets 150 - 400 K/uL  280  328        Latest Ref Rng & Units 08/12/2022   10:22 AM 06/27/2022    1:55 PM 05/24/2022    8:55 AM  CMP  Glucose 70 - 99 mg/dL 109  108  90   BUN 6 - 24 mg/dL 13  9  13    Creatinine 0.57 - 1.00 mg/dL 1.09  0.91  0.94   Sodium 134 - 144 mmol/L 138  137  138   Potassium 3.5 - 5.2 mmol/L 4.7  3.9  4.7   Chloride 96 - 106 mmol/L 104  107  108   CO2 20 - 29 mmol/L 16  24  24    Calcium 8.7 - 10.2 mg/dL 9.7  9.4  9.3   Total Protein 6.0 - 8.5 g/dL 7.4  7.3  7.1    7.0   Total Bilirubin 0.0 - 1.2 mg/dL 0.7  0.6  0.3   Alkaline Phos 44 - 121 IU/L 57  52    AST 0 - 40 IU/L 55  38  27   ALT 0 - 32 IU/L 71  55  46     Lipid Panel  Lab Results  Component Value Date   CHOL 202 (H) 08/12/2022   HDL 41 08/12/2022   LDLCALC 137 (H) 08/12/2022   LDLDIRECT 144 (H) 08/12/2022   TRIG 135 08/12/2022   CHOLHDL 5 10/08/2021   No components found for: "NTPROBNP" No results for input(s): "PROBNP" in the last 8760 hours. Recent Labs    05/24/22 0855 08/12/22 1022  TSH 3.40 2.490    BMP Recent Labs    05/24/22 0855 06/27/22 1355 08/12/22 1022  NA 138 137 138  K 4.7 3.9 4.7  CL 108 107 104  CO2 24 24 16*  GLUCOSE 90 108* 109*  BUN 13 9 13   CREATININE 0.94 0.91 1.09*  CALCIUM 9.3 9.4 9.7  GFRNONAA  --  >60  --     HEMOGLOBIN A1C Lab Results  Component Value Date   HGBA1C 5.8 08/30/2022    IMPRESSION:    ICD-10-CM   1. Essential hypertension  I10 isosorbide dinitrate (ISORDIL) 20 MG tablet  2. Pure hypercholesterolemia  E78.00     3. Muscle cramps  R25.2     4. Snoring  R06.83     5. Gammopathy  D47.2     6. Class 3 severe obesity due to excess calories with serious comorbidity and body mass index (BMI) of 50.0 to 59.9 in adult [facility])  E66.01    Z68.43         RECOMMENDATIONS: KRISSANDRA MOTTL is a 47 y.o. 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   Essential hypertension Uncontrolled. Improving. She has been struggling with generalized muscle cramps and therefore her allergy lists include HCTZ, metoprolol, amlodipine, spironolactone, and hydralazine. Was willing to retry hydralazine and has done well with 25 mg p.o. 3 times daily (have not uptitrated hydralazine as well as potential side effects of drug-induced lupus and she already has elevated CK levels and complaints of myalgias) Last office visit she was started on labetalol which she is tolerated well. Will start isosorbide dinitrate 20 mg p.o. 3 times daily. Despite holding the above-mentioned antihypertensive medications she continues to have myalgias.  I suspect that the myalgias that she was originally experiencing is likely due to some other etiology and not her BP pills. I have asked her to reconsider the above-mentioned BP medications  Plasma aldosterone and renin levels within normal limits Aldosterone to renin ratio within normal limits. A.m. cortisol lower limit of normal. TSH within normal limits Calcium levels within normal limits Plasma metanephrines within normal limits 24-hour urine metanephrines and normetanephrine's within normal limits Urine protein creatinine ratio within normal limits She was referred to sleep medicine as well; however, the consult still pending.  Patient provided their contact information to reach out to them.  Muscle cramps Currently follows up with rheumatology.  Mixed hyperlipidemia LDL levels are not well-controlled as of January 2024 Estimated 10-year risk of ASCVD approximately 5.3% as per current parameters  Currently not on statin therapy. Patient would like to avoid pharmacological therapy due to her  baseline myalgias. Educated her on the importance of reducing foods that are high in lipids and triglycerides. Monitor for now.  FINAL MEDICATION LIST END OF ENCOUNTER: Meds ordered this encounter  Medications   isosorbide dinitrate (ISORDIL) 20 MG tablet    Sig: Take 1 tablet (20 mg total) by mouth 3 (three) times daily.    Dispense:  90 tablet    Refill:  0     Current Outpatient Medications:    EPINEPHrine 0.3 mg/0.3 mL IJ SOAJ injection, Inject 0.3 mg into the muscle as needed for anaphylaxis., Disp: 1 each, Rfl: 0   hydrALAZINE (APRESOLINE) 25 MG tablet, Take 1 tablet (25 mg total) by mouth 3 (three) times daily., Disp: 270 tablet, Rfl: 1   ibuprofen (ADVIL) 800 MG tablet, Take 800 mg by mouth every 8 (eight) hours as needed., Disp: , Rfl:    isosorbide dinitrate (ISORDIL) 20 MG tablet, Take 1 tablet (20 mg total) by mouth 3 (three) times daily., Disp: 90 tablet, Rfl: 0   labetalol (NORMODYNE) 200 MG tablet, Take 1 tablet (200 mg total) by mouth in the morning, at noon, and at bedtime., Disp: 90 tablet, Rfl: 0   levonorgestrel (MIRENA) 20 MCG/24HR IUD, 1 each by Intrauterine route once., Disp: , Rfl:    scopolamine (TRANSDERM-SCOP) 1 MG/3DAYS, Place 1 patch (1.5 mg total) onto the skin every 3 (three) days., Disp: 10 patch, Rfl: 12  tiZANidine (ZANAFLEX) 4 MG tablet, TAKE 1/2 TO 1 TABLET BY MOUTH AT BEDTIME AS NEEDED FOR MUSCLE SPASM, Disp: 30 tablet, Rfl: 2   zolpidem (AMBIEN CR) 6.25 MG CR tablet, Take 1 tablet (6.25 mg total) by mouth at bedtime as needed., Disp: 30 tablet, Rfl: 0  No orders of the defined types were placed in this encounter.   There are no Patient Instructions on file for this visit.   --Continue cardiac medications as reconciled in final medication list. --Return in about 6 weeks (around 12/02/2022) for Follow up, BP. or sooner if needed. --Continue follow-up with your primary care physician regarding the management of your other chronic comorbid  conditions.  Patient's questions and concerns were addressed to her satisfaction. She voices understanding of the instructions provided during this encounter.   This note was created using a voice recognition software as a result there may be grammatical errors inadvertently enclosed that do not reflect the nature of this encounter. Every attempt is made to correct such errors.  Rex Kras, [place], [facility]  Pager:  [phone] Office: [phone]

## 2022-10-28 ENCOUNTER — Other Ambulatory Visit: Payer: Self-pay | Admitting: Cardiology

## 2022-10-28 DIAGNOSIS — I10 Essential (primary) hypertension: Secondary | ICD-10-CM

## 2022-11-01 ENCOUNTER — Telehealth: Payer: Self-pay

## 2022-11-01 NOTE — Telephone Encounter (Signed)
Patient is calling to let you know that the isosorbide dinitrate is causing severe cramps and she cannot take it anymore. Is there any other options for her? She also wanted to let you know she is seeing neurology at the end of the month.   Call back number is [phone]

## 2022-11-02 NOTE — Telephone Encounter (Signed)
LMTCB

## 2022-11-02 NOTE — Telephone Encounter (Signed)
Have her stop isosorbide dinitrate.   Ask if she will to RE-TRY norvasc - if so send in a script for norvasc 10mg  po qday.   Jaki Steptoe [place], DO, [facility]

## 2022-11-12 ENCOUNTER — Other Ambulatory Visit: Payer: Self-pay | Admitting: Cardiology

## 2022-11-12 DIAGNOSIS — I10 Essential (primary) hypertension: Secondary | ICD-10-CM

## 2022-11-28 ENCOUNTER — Telehealth: Payer: Self-pay

## 2022-11-28 DIAGNOSIS — E559 Vitamin D deficiency, unspecified: Secondary | ICD-10-CM

## 2022-11-28 NOTE — Telephone Encounter (Signed)
I would have take what she was on otherwise she can go to ED.   Greydis Stlouis Sullivan, DO, [facility]

## 2022-11-28 NOTE — Telephone Encounter (Signed)
I had not given her lisinopril/losartan as she was being worked up for secondary hypertension causes.  Clonidine is not a very clean medication and she will need more than that given her numbers.  I am more than happy to discuss it at follow or she can seek a second appt given her BP reading and intolerance to so many antihypertensive medications.   Dr. Odis Hollingshead

## 2022-11-28 NOTE — Telephone Encounter (Signed)
Patient stopped all meds labetaol /isosorbide/ hydralazine due to side effects. Sleep doctor suggests Clonidine, lisinopril, losartan. Please advise

## 2022-11-28 NOTE — Telephone Encounter (Signed)
She has appointment 5/9 her blood pressure is super high, does she need a sooner appointment?

## 2022-11-29 ENCOUNTER — Ambulatory Visit: Payer: BC Managed Care – PPO | Admitting: Neurology

## 2022-11-29 ENCOUNTER — Telehealth: Payer: Self-pay | Admitting: Neurology

## 2022-11-29 ENCOUNTER — Encounter: Payer: Self-pay | Admitting: Neurology

## 2022-11-29 VITALS — BP 181/134 | HR 80 | Ht 69.0 in | Wt 333.5 lb

## 2022-11-29 DIAGNOSIS — R252 Cramp and spasm: Secondary | ICD-10-CM

## 2022-11-29 MED ORDER — DULOXETINE HCL 30 MG PO CPEP: 30.0000 mg | ORAL_CAPSULE | Freq: Every day | ORAL | 5 refills | Status: DC

## 2022-11-29 MED ORDER — GABAPENTIN 300 MG PO CAPS: 300.0000 mg | ORAL_CAPSULE | Freq: Three times a day (TID) | ORAL | 11 refills | Status: DC

## 2022-11-29 NOTE — Progress Notes (Unsigned)
Chief Complaint  Patient presents with   New Patient (Initial Visit)    Rm 15, alone Muscle cramps all over intermittent sometimes debilitating and in tears , elevated CK, possible NCS/EMG      ASSESSMENT AND PLAN  [name] is a 47 y.o. female   Muscle cramps  Essentially normal neurological examination, unsure etiology,  Differentiation diagnosis including electrolyte imbalance, inflammatory process  Laboratory evaluations  Gabapentin up to 300 mg 3 times daily as needed  If above measures fail to improve her symptoms, may consider EMG nerve conduction study  DIAGNOSTIC DATA (LABS, IMAGING, TESTING) - I reviewed patient records, labs, notes, testing and imaging myself where available.   MEDICAL HISTORY:  [name] is a 47 year old female, seen in request by Dr. Pollyann Savoy for evaluation of muscle cramps, initial evaluation November 29, 2022   I reviewed and summarized the referring note. PMHX. Chronic insomnia HTN Obesity  She has been diagnosed with hypertension for 2 years, the last couple years tried different medications, reported since she started taking blood pressure medication in 2022, she developed muscle twitching, involving different body part, muscle cramps, tried different [place] without change  Also see rheumatologist no clear etiology found  She describes sudden onset temporal muscle spasm, involving calf, spine, upper extremity, transient last for few minutes, and multiple episodes a day, very painful,  Laboratory evaluation January 2024: A1c 5.8, gliadin antibody was negative, normal ESR, C-reactive protein, CPK was 260, LDL was 144, normal TSH, hemoglobin of 13.8, CMP creatinine of 1.09 June 2022, M protein was mildly elevated 0.5, IgM monoclonal protein with lambda light chain specificity  PHYSICAL EXAM:   Vitals:   11/29/22 1005  BP: (!) 181/134  Pulse: 80  Weight: (!) 333 lb 8 oz (151.3 kg)  Height: 5\' 9"  (1.753  m)     Body mass index is 49.25 kg/m.  PHYSICAL EXAMNIATION:  Gen: NAD, conversant, well nourised, well groomed                     Cardiovascular: Regular rate rhythm, no peripheral edema, warm, nontender. Eyes: Conjunctivae clear without exudates or hemorrhage Neck: Supple, no carotid bruits. Pulmonary: Clear to auscultation bilaterally   NEUROLOGICAL EXAM:  MENTAL STATUS: Speech/cognition: Awake, alert, oriented to history taking and casual conversation CRANIAL NERVES: CN II: Visual fields are full to confrontation. Pupils are round equal and briskly reactive to light. CN III, IV, VI: extraocular movement are normal. No ptosis. CN V: Facial sensation is intact to light touch CN VII: Face is symmetric with normal eye closure  CN VIII: Hearing is normal to causal conversation. CN IX, X: Phonation is normal. CN XI: Head turning and shoulder shrug are intact  MOTOR: There is no pronator drift of out-stretched arms. Muscle bulk and tone are normal. Muscle strength is normal.  REFLEXES: Reflexes are 1 and symmetric at the biceps, triceps, knees, and ankles. Plantar responses are flexor.  SENSORY: Intact to light touch, pinprick and vibratory sensation are intact in fingers and toes.  COORDINATION: There is no trunk or limb dysmetria noted.  GAIT/STANCE: Need push-up to get up from seated position, cautious, mildly antalgic due to right knee pain,  REVIEW OF SYSTEMS:  Full 14 system review of systems performed and notable only for as above All other review of systems were negative.   ALLERGIES: Allergies  Allergen Reactions   Bee Venom Anaphylaxis    HOME MEDICATIONS: Current Outpatient Medications  Medication  Sig Dispense Refill   EPINEPHrine 0.3 mg/0.3 mL IJ SOAJ injection Inject 0.3 mg into the muscle as needed for anaphylaxis. 1 each 0   ibuprofen (ADVIL) 800 MG tablet Take 800 mg by mouth every 8 (eight) hours as needed.     levonorgestrel (MIRENA) 20  MCG/24HR IUD 1 each by Intrauterine route once.     scopolamine (TRANSDERM-SCOP) 1 MG/3DAYS Place 1 patch (1.5 mg total) onto the skin every 3 (three) days. 10 patch 12   tiZANidine (ZANAFLEX) 4 MG tablet TAKE 1/2 TO 1 TABLET BY MOUTH AT BEDTIME AS NEEDED FOR MUSCLE SPASM 30 tablet 2   zolpidem (AMBIEN CR) 6.25 MG CR tablet Take 1 tablet (6.25 mg total) by mouth at bedtime as needed. 30 tablet 0   No current facility-administered medications for this visit.    PAST MEDICAL HISTORY: Past Medical History:  Diagnosis Date   Hypertension    Murmur     PAST SURGICAL HISTORY: Past Surgical History:  Procedure Laterality Date   ABDOMINOPLASTY     ACHILLES TENDON SURGERY  06/08/2012   Procedure: ACHILLES TENDON REPAIR;  Surgeon: Nadara Mustard, MD;  Location: [facility];  Service: Orthopedics;  Laterality: Right;  Right Achilles Reconstruction   APPENDECTOMY     CHOLECYSTECTOMY     HYSTEROSCOPY WITH D & C N/A 03/06/2014   Procedure: DILATATION AND CURETTAGE With IUD Removal;  Surgeon: Serita Kyle, MD;  Location: [facility];  Service: Gynecology;  Laterality: N/A;   KNEE SURGERY     right knee arthroscropic    FAMILY HISTORY: Family History  Problem Relation Age of Onset   Diabetes Mother    Hypertension Mother    Diabetes Father    Hypertension Father    Hypertension Sister    Autism Sister    Healthy Son    Healthy Daughter    Healthy Daughter     SOCIAL HISTORY: Social History   Socioeconomic History   Marital status: Married    Spouse name: Renae Fickle   Number of children: 3   Years of education: Nursing school   Highest education level: Not on file  Occupational History   Occupation: Teacher, adult education: [employer]  Tobacco Use   Smoking status: Never    Passive exposure: Never   Smokeless tobacco: Never  Vaping Use   Vaping Use: Never used  Substance and Sexual Activity   Alcohol use: Yes    Comment: once a month, 1-2 servings   Drug use: No   Sexual activity: Yes    Birth  control/protection: I.U.D.  Other Topics Concern   Not on file  Social History Narrative      3 kids - ages 36 - 37   Husband - Renae Fickle   Enjoys: watching her kids play sports, spending time with husband   Exercise: not regular now - hoping to get back to it   Diet: not get currently, but hoping to do better   Social Determinants of Health   Financial Resource Strain: Low Risk  (08/09/2018)   Overall Financial Resource Strain (CARDIA)    Difficulty of Paying Living Expenses: Not hard at all  Food Insecurity: Not on file  Transportation Needs: Not on file  Physical Activity: Not on file  Stress: Not on file  Social Connections: Not on file  Intimate Partner Violence: Not on file      Levert Feinstein, M.D. Ph.D.  [facility] [address] [place], [place] [address]  Ph: [phone] Fax: [phone]  CC:  Pollyann Savoy, MD [address] [place],  [place] [address]  Mort Sawyers, FNP

## 2022-11-29 NOTE — Telephone Encounter (Signed)
Pt stated at check out that she would like to know if there are other options other than Gabapentin. Pt states that taking this medication three time a day will make it difficult with her work schedule

## 2022-11-29 NOTE — Telephone Encounter (Signed)
Meds ordered this encounter  Medications   DULoxetine (CYMBALTA) 30 MG capsule    Sig: Take 1 capsule (30 mg total) by mouth daily.    Dispense:  30 capsule    Refill:  5     I add on cymbalta 30mg  daily, gabapentin 300mg  as needed.

## 2022-11-29 NOTE — Addendum Note (Signed)
Addended by: Levert Feinstein on: 11/29/2022 05:27 PM   Modules accepted: Orders

## 2022-11-30 LAB — COMPREHENSIVE METABOLIC PANEL
ALT: 42 IU/L — ABNORMAL HIGH (ref 0–32)
AST: 25 IU/L (ref 0–40)
Albumin/Globulin Ratio: 1.8 (ref 1.2–2.2)
Albumin: 4.4 g/dL (ref 3.9–4.9)
Alkaline Phosphatase: 57 IU/L (ref 44–121)
BUN/Creatinine Ratio: 9 (ref 9–23)
BUN: 10 mg/dL (ref 6–24)
Bilirubin Total: 0.5 mg/dL (ref 0.0–1.2)
CO2: 20 mmol/L (ref 20–29)
Calcium: 9.3 mg/dL (ref 8.7–10.2)
Chloride: 104 mmol/L (ref 96–106)
Creatinine, Ser: 1.1 mg/dL — ABNORMAL HIGH (ref 0.57–1.00)
Globulin, Total: 2.4 g/dL (ref 1.5–4.5)
Glucose: 96 mg/dL (ref 70–99)
Potassium: 4.8 mmol/L (ref 3.5–5.2)
Sodium: 141 mmol/L (ref 134–144)
Total Protein: 6.8 g/dL (ref 6.0–8.5)
eGFR: 63 mL/min/{1.73_m2} (ref >59)

## 2022-11-30 LAB — VITAMIN D 25 HYDROXY (VIT D DEFICIENCY, FRACTURES): Vit D, 25-Hydroxy: 14.7 ng/mL — ABNORMAL LOW (ref 30.0–100.0)

## 2022-11-30 LAB — PTH, INTACT AND CALCIUM: PTH: 42 pg/mL (ref 15–65)

## 2022-11-30 LAB — VITAMIN B12: Vitamin B-12: 180 pg/mL — ABNORMAL LOW (ref 232–1245)

## 2022-11-30 LAB — TSH: TSH: 3.41 u[IU]/mL (ref 0.450–4.500)

## 2022-11-30 LAB — ANA W/REFLEX IF POSITIVE: Anti Nuclear Antibody (ANA): NEGATIVE

## 2022-11-30 LAB — CK: Total CK: 188 U/L — ABNORMAL HIGH (ref 32–182)

## 2022-11-30 NOTE — Telephone Encounter (Signed)
Left msg to call back 1st attempt

## 2022-12-01 DIAGNOSIS — E559 Vitamin D deficiency, unspecified: Secondary | ICD-10-CM | POA: Insufficient documentation

## 2022-12-01 MED ORDER — VITAMIN D (ERGOCALCIFEROL) 1.25 MG (50000 UNIT) PO CAPS: 50000.0000 [IU] | ORAL_CAPSULE | ORAL | 0 refills | Status: DC

## 2022-12-01 NOTE — Telephone Encounter (Signed)
Left msg 2nd attempt will send mychart msg

## 2022-12-01 NOTE — Telephone Encounter (Signed)
Please call patient.  Laboratory evaluation showed:   ---Significantly decreased Vit D level 14.7, I have prescribed vit D supplement to your pharmacy. Vitamin D, Ergocalciferol, (DRISDOL) 1.25 MG (50000 UNIT) CAPS capsule 50,000 Units, Every 7 days 0 ordered   Summary: Take 1 capsule (50,000 Units total) by mouth every 7 (seven) days., Starting Thu 12/01/2022, Normal Dose, Route, Frequency: 50,000 Units, Oral, Every 7 daysStart: 05/02/2024Ord/Sold: 12/01/2022 (O)Ordered On: 05/02/2024Pharmacy: [id number] - [place], [place] - [address]   ----Slight elevation of CPK, unknown clinical significance.     ----B12 deficiency, you need B12 IM supplement.  I have forward the result to you primarcy care. Mort Sawyers, FNP you many contact her.   ---Slight elevation of creatinine, please increase water intake.   ---Rest laboratory evaluation showed no significant abnormalities.   Levert Feinstein, M.D. Ph.D.   [facility] [address] [place], [place] [address] Phone: [phone] Fax:      [phone]

## 2022-12-05 NOTE — Telephone Encounter (Signed)
Lab results were sent via mychart as the pt is very active.

## 2022-12-05 NOTE — Telephone Encounter (Signed)
Also looked like within this encounter another provider changed meds due to bp. Just wanted to make you aware.

## 2022-12-07 LAB — IRON,TIBC AND FERRITIN PANEL
Iron Saturation: 24 % (ref 15–55)
Iron: 91 ug/dL (ref 27–159)
Total Iron Binding Capacity: 380 ug/dL (ref 250–450)
UIBC: 289 ug/dL (ref 131–425)

## 2022-12-08 ENCOUNTER — Ambulatory Visit: Payer: BC Managed Care – PPO | Admitting: Cardiology

## 2022-12-08 ENCOUNTER — Encounter: Payer: Self-pay | Admitting: Cardiology

## 2022-12-08 VITALS — BP 170/105 | HR 70 | Resp 18 | Ht 69.0 in | Wt 335.8 lb

## 2022-12-08 DIAGNOSIS — I10 Essential (primary) hypertension: Secondary | ICD-10-CM

## 2022-12-08 DIAGNOSIS — R252 Cramp and spasm: Secondary | ICD-10-CM

## 2022-12-08 DIAGNOSIS — E7800 Pure hypercholesterolemia, unspecified: Secondary | ICD-10-CM

## 2022-12-08 MED ORDER — HYDRALAZINE HCL 25 MG PO TABS: 25.0000 mg | ORAL_TABLET | Freq: Three times a day (TID) | ORAL | 3 refills | Status: DC

## 2022-12-08 MED ORDER — LABETALOL HCL 200 MG PO TABS: 200.0000 mg | ORAL_TABLET | Freq: Three times a day (TID) | ORAL | 0 refills | Status: DC

## 2022-12-08 MED ORDER — LOSARTAN POTASSIUM 50 MG PO TABS: 50.0000 mg | ORAL_TABLET | Freq: Every morning | ORAL | 3 refills | Status: DC

## 2022-12-08 NOTE — Progress Notes (Signed)
d  ID:  Karen, Salazar 22-Jul-1976, MRN [id number]  PCP:  Mort Sawyers, FNP  Cardiologist:  Tessa Lerner, DO, [facility] (established care 08/12/2022)  Date: 12/08/22 Last Office Visit: 10/21/2022  Chief Complaint  Patient presents with   Hypertension   Follow-up    HPI  [name] is a 47 y.o. 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   Referred to the practice for evaluation and management of benign essential hypertension and cardiac murmur.  Hypertension: Diagnosed at the age of 90-41, per patient. Has been struggling with symptoms of generalized muscle cramps and therefore has listed multiple antihypertensive class of medications has allergies (HCTZ, metoprolol, amlodipine, spironolactone, hydralazine).  Since she was undergoing workup for secondary hypertension was avoiding the addition of ACE inhibitors/ARB or mineralocorticoid's.  She was started on low-dose hydralazine (avoided uptitration due to the possibility of it causing drug-induced lupus as she already has myalgias).  She was later started on labetalol and did well.  At last office visit uptitrated labetalol as well as initiated isosorbide dinitrate.  Patient stopped taking Isordil secondary to body aches and at the same time also stopped taking hydralazine and labetalol for reasons unknown.  Knowing that the blood pressures are elevated she chooses not to check her blood pressures at home.  She has established care with sleep medicine and has been diagnosed with sleep apnea and is awaiting device.  FUNCTIONAL STATUS: No structured exercise program or daily routine (hip and knee pain)   ALLERGIES: Allergies  Allergen Reactions   Bee Venom Anaphylaxis    MEDICATION LIST PRIOR TO VISIT: Current Meds  Medication Sig   EPINEPHrine 0.3 mg/0.3 mL IJ SOAJ injection Inject  0.3 mg into the muscle as needed for anaphylaxis.   hydrALAZINE (APRESOLINE) 25 MG tablet Take 1 tablet (25 mg total) by mouth 3 (three) times daily.   ibuprofen (ADVIL) 800 MG tablet Take 800 mg by mouth every 8 (eight) hours as needed.   labetalol (NORMODYNE) 200 MG tablet Take 1 tablet (200 mg total) by mouth 3 (three) times daily.   levonorgestrel (MIRENA) 20 MCG/24HR IUD 1 each by Intrauterine route once.   losartan (COZAAR) 50 MG tablet Take 1 tablet (50 mg total) by mouth in the morning.   scopolamine (TRANSDERM-SCOP) 1 MG/3DAYS Place 1 patch (1.5 mg total) onto the skin every 3 (three) days.   tiZANidine (ZANAFLEX) 4 MG tablet TAKE 1/2 TO 1 TABLET BY MOUTH AT BEDTIME AS NEEDED FOR MUSCLE SPASM   Vitamin D, Ergocalciferol, (DRISDOL) 1.25 MG (50000 UNIT) CAPS capsule Take 1 capsule (50,000 Units total) by mouth every 7 (seven) days.   zolpidem (AMBIEN CR) 6.25 MG CR tablet Take 1 tablet (6.25 mg total) by mouth at bedtime as needed.     PAST MEDICAL HISTORY: Past Medical History:  Diagnosis Date   Hypertension    Murmur     PAST SURGICAL HISTORY: Past Surgical History:  Procedure Laterality Date   ABDOMINOPLASTY     ACHILLES TENDON SURGERY  06/08/2012   Procedure: ACHILLES TENDON REPAIR;  Surgeon: Nadara Mustard, MD;  Location: [facility] OR;  Service: Orthopedics;  Laterality: Right;  Right Achilles Reconstruction   APPENDECTOMY     CHOLECYSTECTOMY     HYSTEROSCOPY WITH D & C N/A 03/06/2014   Procedure: DILATATION AND CURETTAGE With IUD Removal;  Surgeon: Serita Kyle, MD;  Location: [facility];  Service: Gynecology;  Laterality: N/A;   KNEE SURGERY     right knee arthroscropic    FAMILY HISTORY: The patient family history includes Autism in her sister; Diabetes in her father and mother; Healthy in her daughter, daughter, and son; Hypertension in her father, mother, and sister.  SOCIAL HISTORY:  The patient  reports that she has never smoked. She has never been exposed to tobacco  smoke. She has never used smokeless tobacco. She reports current alcohol use. She reports that she does not use drugs.  REVIEW OF SYSTEMS: Review of Systems  Constitutional: Positive for malaise/fatigue.       Obesity  Cardiovascular:  Negative for chest pain, claudication, dyspnea on exertion, irregular heartbeat, leg swelling, near-syncope, orthopnea, palpitations, paroxysmal nocturnal dyspnea and syncope.  Respiratory:  Positive for snoring. Negative for shortness of breath.   Hematologic/Lymphatic: Negative for bleeding problem.  Musculoskeletal:  Positive for joint pain, muscle cramps and stiffness. Negative for myalgias.  Neurological:  Positive for headaches. Negative for dizziness and light-headedness.    PHYSICAL EXAM:    12/08/2022   10:23 AM 12/08/2022   10:14 AM 11/29/2022   10:05 AM  Vitals with BMI  Height  5\' 9"  5\' 9"   Weight  335 lbs 13 oz 333 lbs 8 oz  BMI  49.57 49.23  Systolic 170 189 161  Diastolic 105 125 096  Pulse 70 69 80    Physical Exam  Constitutional: No distress.  Age appropriate, hemodynamically stable.   Neck: No JVD present.  Cardiovascular: Normal rate, regular rhythm, S1 normal, S2 normal, intact distal pulses and normal pulses. Exam reveals no gallop, no S3 and no S4.  Pulses:      Dorsalis pedis pulses are 2+ on the right side and 2+ on the left side.       Posterior tibial pulses are 2+ on the right side and 2+ on the left side.  Pulmonary/Chest: Effort normal and breath sounds normal. No stridor. She has no wheezes. She has no rales.  Abdominal: Soft. Bowel sounds are normal. She exhibits no distension. There is no abdominal tenderness.  Abdominal obesity  Musculoskeletal:        General: No edema.     Cervical back: Neck supple.  Neurological: She is alert and oriented to person, place, and time. She has intact cranial nerves (2-12).  Skin: Skin is warm and moist.    RADIOLOGY: Renal ultrasound: 01/03/2022: 1. Normal ultrasound  appearance of the kidneys 2. The liver appears echogenic suggesting hepatic steatosis, this may be correlated with LFTs  DG bone survey: 07/06/2022 1. No worrisome lytic or sclerotic lesions. 2. Reversal the normal cervical lordosis. 3. 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   CARDIAC DATABASE: EKG: 08/12/2022: Sinus rhythm, 75 bpm, LAE, nonspecific T wave abnormality.  Echocardiogram: 08/31/2022 2D Longitudinal Strain2D Strain GLS [facility]): 18.5 % 2D Strain GLS (A3C):  15.9 % 2D Strain GLS (A4C): 11.6 % 2D Strain GLS Avg:15.3 %      . Left ventricular ejection fraction, by estimation, is 55 to 60%.  Left ventricular ejection fraction by PLAX is 59 %. The left ventricle has  normal function. The left ventricle has no regional wall motion  abnormalities. There is mild concentric left   ventricular hypertrophy. Left ventricular diastolic parameters are  consistent with Grade II diastolic dysfunction (pseudonormalization).  Elevated left atrial pressure. The E/e' is 14.1 Medial.   2. Right ventricular systolic function is normal. The right ventricular  size is normal.  There is normal pulmonary artery systolic pressure.   3. Left atrial size was mildly dilated.   4. The mitral valve is normal in structure. Trivial mitral valve  regurgitation.   5. The aortic valve is normal in structure. Aortic valve regurgitation is  not visualized. No aortic stenosis is present.   6. The inferior vena cava is normal in size with greater than 50%  respiratory variability, suggesting right atrial pressure of 3 mmHg.   Stress Testing: No results found for this or any previous visit from the past 1095 days.   Heart Catheterization: None  LABORATORY DATA:    Latest Ref Rng & Units 08/12/2022   10:22 AM 06/27/2022    1:55 PM 05/24/2022    8:55 AM  CBC  WBC 4.0 - 10.5 K/uL  4.0  4.5   Hemoglobin 11.1 - 15.9 g/dL 16.1  09.6  04.5   Hematocrit 34.0 - 46.6 % 42.6  40.5  40.7    Platelets 150 - 400 K/uL  280  328        Latest Ref Rng & Units 11/29/2022   10:38 AM 08/12/2022   10:22 AM 06/27/2022    1:55 PM  CMP  Glucose 70 - 99 mg/dL 96  409  811   BUN 6 - 24 mg/dL 10  13  9    Creatinine 0.57 - 1.00 mg/dL 9.14  7.82  9.56   Sodium 134 - 144 mmol/L 141  138  137   Potassium 3.5 - 5.2 mmol/L 4.8  4.7  3.9   Chloride 96 - 106 mmol/L 104  104  107   CO2 20 - 29 mmol/L 20  16  24    Calcium 8.7 - 10.2 mg/dL 9.3  9.7  9.4   Total Protein 6.0 - 8.5 g/dL 6.8  7.4  7.3   Total Bilirubin 0.0 - 1.2 mg/dL 0.5  0.7  0.6   Alkaline Phos 44 - 121 IU/L 57  57  52   AST 0 - 40 IU/L 25  55  38   ALT 0 - 32 IU/L 42  71  55     Lipid Panel  Lab Results  Component Value Date   CHOL 202 (H) 08/12/2022   HDL 41 08/12/2022   LDLCALC 137 (H) 08/12/2022   LDLDIRECT 144 (H) 08/12/2022   TRIG 135 08/12/2022   CHOLHDL 5 10/08/2021   No components found for: "NTPROBNP" No results for input(s): "PROBNP" in the last 8760 hours. Recent Labs    05/24/22 0855 08/12/22 1022 11/29/22 1038  TSH 3.40 2.490 3.410    BMP Recent Labs    06/27/22 1355 08/12/22 1022 11/29/22 1038  NA 137 138 141  K 3.9 4.7 4.8  CL 107 104 104  CO2 24 16* 20  GLUCOSE 108* 109* 96  BUN 9 13 10   CREATININE 0.91 1.09* 1.10*  CALCIUM 9.4 9.7 9.3  GFRNONAA >60  --   --     HEMOGLOBIN A1C Lab Results  Component Value Date   HGBA1C 5.8 08/30/2022    IMPRESSION:    ICD-10-CM   1. Essential hypertension  I10 Ambulatory referral to Cardiology    losartan (COZAAR) 50 MG tablet    Basic metabolic panel    hydrALAZINE (APRESOLINE) 25 MG tablet    labetalol (NORMODYNE) 200 MG tablet    2. Pure hypercholesterolemia  E78.00     3. Muscle cramps  R25.2        RECOMMENDATIONS: Chaneka H  Gajewski is a 47 y.o. 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    Essential hypertension Uncontrolled. She has been struggling with generalized muscle cramps and therefore her allergy lists include HCTZ, metoprolol, amlodipine, spironolactone, and hydralazine. While she was being worked up for secondary hypertension she was agreeable to start low-dose hydralazine, she did tolerate it well but since last office visit stopped taking it. Since last visit she is also stopped labetalol and isosorbide dinitrate She is currently not taking any antihypertensive medications thinking they all cause myalgias. Patient is very well aware that uncontrolled hypertension can lead to comorbid conditions which include but not limited to strokes, cardiomyopathy, cardiovascular conditions, worsening renal function leading to dialysis. She is agreeable to restart labetalol at her prior dose as well as hydralazine. Since her secondary workup for hypertension is complete we will start losartan 50 mg p.o. daily with labs in 1 week to evaluate kidney function and potassium. She is awaiting delivery of her sleep apnea device.  She follows with [facility]. Secondary workup Plasma aldosterone and renin levels within normal limits Aldosterone to renin ratio within normal limits. A.m. cortisol lower limit of normal. TSH within normal limits Calcium levels within normal limits Plasma metanephrines within normal limits 24-hour urine metanephrines and normetanephrine's within normal limits Urine protein creatinine ratio within normal limits Will refer her to hypertension clinic under the care of Dr. Duke Salvia for further evaluation and management. Patient is also asked to reach out to rheumatology for management of her myalgias to see if there is a unifying diagnosis as this may be the root cause of her symptoms but she is attributing to antihypertensive medications  Muscle cramps Currently follows up with rheumatology.  Mixed hyperlipidemia LDL levels are not well-controlled as of  January 2024 Estimated 10-year risk of ASCVD approximately 5.3%  Currently not on statin therapy. Patient would like to avoid pharmacological therapy due to her baseline myalgias. Educated her on the importance of reducing foods that are high in lipids and triglycerides for the interim.  Monitor for now.  FINAL MEDICATION LIST END OF ENCOUNTER: Meds ordered this encounter  Medications   losartan (COZAAR) 50 MG tablet    Sig: Take 1 tablet (50 mg total) by mouth in the morning.    Dispense:  90 tablet    Refill:  3   hydrALAZINE (APRESOLINE) 25 MG tablet    Sig: Take 1 tablet (25 mg total) by mouth 3 (three) times daily.    Dispense:  270 tablet    Refill:  3   labetalol (NORMODYNE) 200 MG tablet    Sig: Take 1 tablet (200 mg total) by mouth 3 (three) times daily.    Dispense:  270 tablet    Refill:  0     Current Outpatient Medications:    EPINEPHrine 0.3 mg/0.3 mL IJ SOAJ injection, Inject 0.3 mg into the muscle as needed for anaphylaxis., Disp: 1 each, Rfl: 0   hydrALAZINE (APRESOLINE) 25 MG tablet, Take 1 tablet (25 mg total) by mouth 3 (three) times daily., Disp: 270 tablet, Rfl: 3   ibuprofen (ADVIL) 800 MG tablet, Take 800 mg by mouth every 8 (eight) hours as needed., Disp: , Rfl:    labetalol (NORMODYNE) 200 MG tablet, Take 1 tablet (200 mg total) by mouth 3 (three) times daily., Disp: 270 tablet, Rfl: 0   levonorgestrel (MIRENA) 20 MCG/24HR IUD, 1 each by Intrauterine route once.,  Disp: , Rfl:    losartan (COZAAR) 50 MG tablet, Take 1 tablet (50 mg total) by mouth in the morning., Disp: 90 tablet, Rfl: 3   scopolamine (TRANSDERM-SCOP) 1 MG/3DAYS, Place 1 patch (1.5 mg total) onto the skin every 3 (three) days., Disp: 10 patch, Rfl: 12   tiZANidine (ZANAFLEX) 4 MG tablet, TAKE 1/2 TO 1 TABLET BY MOUTH AT BEDTIME AS NEEDED FOR MUSCLE SPASM, Disp: 30 tablet, Rfl: 2   Vitamin D, Ergocalciferol, (DRISDOL) 1.25 MG (50000 UNIT) CAPS capsule, Take 1 capsule (50,000 Units total) by  mouth every 7 (seven) days., Disp: 7 capsule, Rfl: 0   zolpidem (AMBIEN CR) 6.25 MG CR tablet, Take 1 tablet (6.25 mg total) by mouth at bedtime as needed., Disp: 30 tablet, Rfl: 0   DULoxetine (CYMBALTA) 30 MG capsule, Take 1 capsule (30 mg total) by mouth daily. (Patient not taking: Reported on 12/08/2022), Disp: 30 capsule, Rfl: 5   gabapentin (NEURONTIN) 300 MG capsule, Take 1 capsule (300 mg total) by mouth 3 (three) times daily. (Patient not taking: Reported on 12/08/2022), Disp: 90 capsule, Rfl: 11  Orders Placed This Encounter  Procedures   Basic metabolic panel   Ambulatory referral to Cardiology    There are no Patient Instructions on file for this visit.   --Continue cardiac medications as reconciled in final medication list. --Return in about 6 weeks (around 01/19/2023) for Follow up, BP. or sooner if needed. --Continue follow-up with your primary care physician regarding the management of your other chronic comorbid conditions.  Patient's questions and concerns were addressed to her satisfaction. She voices understanding of the instructions provided during this encounter.   This note was created using a voice recognition software as a result there may be grammatical errors inadvertently enclosed that do not reflect the nature of this encounter. Every attempt is made to correct such errors.  Tessa Lerner, [place], [facility]  Pager:  [phone] Office: [phone]

## 2022-12-21 ENCOUNTER — Ambulatory Visit (HOSPITAL_BASED_OUTPATIENT_CLINIC_OR_DEPARTMENT_OTHER): Payer: BC Managed Care – PPO | Admitting: Cardiovascular Disease

## 2022-12-21 ENCOUNTER — Encounter (HOSPITAL_BASED_OUTPATIENT_CLINIC_OR_DEPARTMENT_OTHER): Payer: Self-pay | Admitting: Cardiovascular Disease

## 2022-12-21 VITALS — BP 156/92 | HR 67 | Ht 69.0 in | Wt 337.2 lb

## 2022-12-21 DIAGNOSIS — I10 Essential (primary) hypertension: Secondary | ICD-10-CM | POA: Diagnosis not present

## 2022-12-21 MED ORDER — AMLODIPINE BESYLATE 5 MG PO TABS: 5.0000 mg | ORAL_TABLET | Freq: Every day | ORAL | 3 refills | Status: DC

## 2022-12-21 NOTE — Progress Notes (Signed)
[facility] Initial Assessment:    Date:  12/21/2022   ID:  Karen, Salazar Jan 04, 1976, MRN [id number]  PCP:  Mort Sawyers, FNP  Cardiologist:  None  Nephrologist:  Referring MD: Tessa Lerner, DO   CC: Hypertension  History of Present Illness:    [name] is a 47 y.o. female with a hx of hypertension, hyperlipidemia, IgG lambda monoclonal gammopathy, OSA, and obesity, here to establish care in the [facility]. She has been followed by her cardiologist Dr. Odis Hollingshead since 08/2022. She had been diagnosed with hypertension at age 60-41. She has intolerance of multiple antihypertensives due to generalized muscle cramps. Workup for secondary hypertension showed normal aldosterone and renin levels. Cortisol was low, TSH and calcium levels were normal. Metanephrines were within normal limits. She was last seen by Dr. Odis Hollingshead 12/08/2022 and her blood pressure was 170/105 in the clinic. At that visit, she was not taking any antihypertensives as she stated they all cause myalgias. She was agreeable to restart labetalol and hydralazine at her prior dose. They also started her on losartan 50 mg daily. She was also waiting for her CPAP. She was referred to the [facility].  Today, she complains of muscle cramps throughout her body that have been intensified by taking antihypertensives. These cramps weren't as severe when she wasn't taking the medications. Her myalgias have taken her breath away and wakes her up at night. She has seen a rheumatologist, immunologist, and neurologist. She was prescribed gabapentin as needed and Cymbalta; she has been taking both at night. Since starting this change she has noticed decreased severity and frequency of her muscle cramps. Around 7-8 PM she may start feeling some muscle cramps, but her next dose will be around 10-11 PM. She states she has struggled with hypertension for about 5 years. A few weeks ago her  blood pressure was 195/125. Currently she is taking her losartan, hydralazine, and labetalol. Her average home readings have been in the 150s/100s. She complains of headaches attributable to high blood pressures. She also notes dizziness upon standing, especially after sitting at her desk for a while. Occasionally she has had random chest pains that she describes as a pressure-like quality. Usually she is lying in bed when they occur. Additionally she complains of always feeling hot and diaphoretic. This has been an ongoing issue for as long as she can remember. She used to exercise frequently. Over the last 7 years she has gained a lot of weight. She had changed careers to pursue nursing school. 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 She notes that prior imaging has shown bursitis in her hips. Her blood pressure is too elevated for her needed knee surgery. She orders out frequently, but her diet is overall not unhealthy. She would be amenable to changing her diet. She does not believe her sodium intake is high. Not much caffeine, and alcohol consumption is limited to occasional social events. No supplements or herbs. In the past she has worked with healthy weight and wellness. She denies any palpitations, shortness of breath, syncope, orthopnea, or PND.  Previous antihypertensives: HCTZ Metoprolol Amlodipine Spironolactone Hydralazine  Past Medical History:  Diagnosis Date   Hypertension    Murmur     Past Surgical History:  Procedure Laterality Date   ABDOMINOPLASTY     ACHILLES TENDON SURGERY  06/08/2012   Procedure: ACHILLES TENDON REPAIR;  Surgeon: Nadara Mustard, MD;  Location: [facility] OR;  Service: Orthopedics;  Laterality: Right;  Right Achilles Reconstruction   APPENDECTOMY     CHOLECYSTECTOMY     HYSTEROSCOPY WITH D & C N/A 03/06/2014   Procedure: DILATATION AND CURETTAGE With IUD Removal;  Surgeon: Serita Kyle, MD;   Location: [facility];  Service: Gynecology;  Laterality: N/A;   KNEE SURGERY     right knee arthroscropic    Current Medications: Current Meds  Medication Sig   amLODipine (NORVASC) 5 MG tablet Take 1 tablet (5 mg total) by mouth daily.   DULoxetine (CYMBALTA) 30 MG capsule Take 1 capsule (30 mg total) by mouth daily.   EPINEPHrine 0.3 mg/0.3 mL IJ SOAJ injection Inject 0.3 mg into the muscle as needed for anaphylaxis.   gabapentin (NEURONTIN) 300 MG capsule Take 1 capsule (300 mg total) by mouth 3 (three) times daily.   hydrALAZINE (APRESOLINE) 25 MG tablet Take 1 tablet (25 mg total) by mouth 3 (three) times daily.   ibuprofen (ADVIL) 800 MG tablet Take 800 mg by mouth every 8 (eight) hours as needed.   labetalol (NORMODYNE) 200 MG tablet Take 1 tablet (200 mg total) by mouth 3 (three) times daily.   levonorgestrel (MIRENA) 20 MCG/24HR IUD 1 each by Intrauterine route once.   losartan (COZAAR) 50 MG tablet Take 1 tablet (50 mg total) by mouth in the morning.   scopolamine (TRANSDERM-SCOP) 1 MG/3DAYS Place 1 patch (1.5 mg total) onto the skin every 3 (three) days.   tiZANidine (ZANAFLEX) 4 MG tablet TAKE 1/2 TO 1 TABLET BY MOUTH AT BEDTIME AS NEEDED FOR MUSCLE SPASM   Vitamin D, Ergocalciferol, (DRISDOL) 1.25 MG (50000 UNIT) CAPS capsule Take 1 capsule (50,000 Units total) by mouth every 7 (seven) days.   zolpidem (AMBIEN CR) 6.25 MG CR tablet Take 1 tablet (6.25 mg total) by mouth at bedtime as needed.     Allergies:   Bee venom   Social History   Socioeconomic History   Marital status: Married    Spouse name: Renae Fickle   Number of children: 3   Years of education: Nursing school   Highest education level: Not on file  Occupational History   Occupation: Teacher, adult education: [employer]  Tobacco Use   Smoking status: Never    Passive exposure: Never   Smokeless tobacco: Never  Vaping Use   Vaping Use: Never used  Substance and Sexual Activity   Alcohol use: Yes    Comment: once a month, 1-2  servings   Drug use: No   Sexual activity: Yes    Birth control/protection: I.U.D.  Other Topics Concern   Not on file  Social History Narrative      3 kids - ages 37 - 19   Husband - Renae Fickle   Enjoys: watching her kids play sports, spending time with husband   Exercise: not regular now - hoping to get back to it   Diet: not get currently, but hoping to do better   Social Determinants of Health   Financial Resource Strain: Low Risk  (08/09/2018)   Overall Financial Resource Strain (CARDIA)    Difficulty of Paying Living Expenses: Not hard at all  Food Insecurity: No Food Insecurity (12/21/2022)   Hunger Vital Sign    Worried About Running Out of Food in the Last Year: Never true    Ran Out of Food in the Last Year: Never true  Transportation Needs: No Transportation Needs (12/21/2022)   PRAPARE - Transportation  Lack of Transportation (Medical): No    Lack of Transportation (Non-Medical): No  Physical Activity: Inactive (12/21/2022)   Exercise Vital Sign    Days of Exercise per Week: 0 days    Minutes of Exercise per Session: 0 min  Stress: Not on file  Social Connections: Not on file     Family History: The patient's family history includes Autism in her sister; Diabetes in her father and mother; Healthy in her daughter, daughter, and son; Heart disease in her mother; Hypertension in her father, mother, and sister.  ROS:   Please see the history of present illness.    (+) Myalgias/Muscle cramps (+) Headaches (+) Dizziness upon standing (+) Occasional chest pressure (+) Chronic diaphoresis (+) Bilateral hip and knee pain All other systems reviewed and are negative.  EKGs/Labs/Other Studies Reviewed:    Echo  08/31/2022: Sonographer Comments: Patient is obese. Image acquisition challenging due  to patient body habitus. Global longitudinal strain was attempted.   IMPRESSIONS   1. 2D Longitudinal Strain2D Strain GLS [facility]): 18.5 % 2D Strain GLS (A3C):  15.9 % 2D Strain GLS  (A4C): 11.6 % 2D Strain GLS Avg:15.3 %      . Left ventricular ejection fraction, by estimation, is 55 to 60%.  Left ventricular ejection fraction by PLAX is 59 %. The left ventricle has  normal function. The left ventricle has no regional wall motion  abnormalities. There is mild concentric left   ventricular hypertrophy. Left ventricular diastolic parameters are  consistent with Grade II diastolic dysfunction (pseudonormalization).  Elevated left atrial pressure. The E/e' is 14.1 Medial.   2. Right ventricular systolic function is normal. The right ventricular  size is normal. There is normal pulmonary artery systolic pressure.   3. Left atrial size was mildly dilated.   4. The mitral valve is normal in structure. Trivial mitral valve  regurgitation.   5. The aortic valve is normal in structure. Aortic valve regurgitation is  not visualized. No aortic stenosis is present.   6. The inferior vena cava is normal in size with greater than 50%  respiratory variability, suggesting right atrial pressure of 3 mmHg.   EKG:  EKG is personally reviewed. 12/21/2022: EKG was not ordered. 08/12/2022 (Dr. Odis Hollingshead): Sinus rhythm, 75 bpm, LAE, nonspecific T wave abnormality.   Recent Labs: 05/24/2022: Magnesium 2.1 06/27/2022: Platelet Count 280 08/12/2022: Hemoglobin 13.8 11/29/2022: ALT 42; BUN 10; Creatinine, Ser 1.10; Potassium 4.8; Sodium 141; TSH 3.410   Recent Lipid Panel    Component Value Date/Time   CHOL 202 (H) 08/12/2022 1022   TRIG 135 08/12/2022 1022   HDL 41 08/12/2022 1022   CHOLHDL 5 10/08/2021 1025   VLDL 24.4 10/08/2021 1025   LDLCALC 137 (H) 08/12/2022 1022   LDLDIRECT 144 (H) 08/12/2022 1022    Physical Exam:    VS:  BP (!) 156/92 (BP Location: Left Arm, Patient Position: Sitting, Cuff Size: Large)   Pulse 67   Ht 5\' 9"  (1.753 m)   Wt (!) 337 lb 3.2 oz (153 kg)   SpO2 96%   BMI 49.80 kg/m  , BMI Body mass index is 49.8 kg/m. GENERAL:  Well appearing HEENT: Pupils  equal round and reactive, fundi not visualized, oral mucosa unremarkable NECK:  No jugular venous distention, waveform within normal limits, carotid upstroke brisk and symmetric, no bruits, no thyromegaly LUNGS:  Clear to auscultation bilaterally HEART:  RRR.  PMI not displaced or sustained,S1 and S2 within normal limits, no S3, no S4, no  clicks, no rubs, no murmurs ABD:  Soft, non-tender EXT:  2 plus pulses throughout, no edema, no cyanosis, no clubbing SKIN:  No rashes, no nodules NEURO:  Cranial nerves II through XII grossly intact, motor grossly intact throughout PSYCH:  Cognitively intact, oriented to person place and time   ASSESSMENT/PLAN:    # Hypertension: In the past several medications were associated with muscle aches.  Now that she is on gabapentin she seems to be tolerating her medications better.  Will start by adding back amlodipine 5 mg daily to see if she tolerates it.  Continue hydralazine, labetalol, and losartan.  Will plan to try and get her off hydralazine if blood pressures improve and she tolerates the medication. - Schedule renal doppler ultrasound to assess for renal artery stenosis. - Encourage lifestyle modifications, including weight loss, diet improvement, and exercise (consider water-based exercises). - Monitor blood pressure twice daily and maintain a log for follow-up visits.  # Leg pain and cramps: Unclear what is causing this. - Continue gabapentin and Cymbalta as prescribed by the neurologist. - Follow up with rheumatologist and immunologist as scheduled.  # Orthostatic hypotension: - Encourage increased hydration to minimize dizziness upon standing. - Monitor for medication side effects and adjust as needed.  # Sleep apnea: - Proceed with sleep study as planned. - Follow up with sleep apnea specialist for results and potential treatment.  # Chest pain: Symptoms when lying down and are not exertional.  No ischemia evaluation needed at this time.     Screening for Secondary Hypertension:     12/21/2022    2:35 PM  Causes  Drugs/Herbals Screened     - Comments + salt, no caffeine/ Rare EtOH.  Sleep Apnea Screened  Thyroid Disease Screened  Hyperaldosteronism Screened  Pheochromocytoma Screened  Cushing's Syndrome Screened  Hyperparathyroidism Screened  Coarctation of the Aorta Screened     - Comments R arm BP higher than the L  Compliance Screened    Relevant Labs/Studies:    Latest Ref Rng & Units 11/29/2022   10:38 AM 08/12/2022   10:22 AM 06/27/2022    1:55 PM  Basic Labs  Sodium 134 - 144 mmol/L 141  138  137   Potassium 3.5 - 5.2 mmol/L 4.8  4.7  3.9   Creatinine 0.57 - 1.00 mg/dL 1.61  0.96  0.45        Latest Ref Rng & Units 11/29/2022   10:38 AM 08/12/2022   10:22 AM  Thyroid   TSH 0.450 - 4.500 uIU/mL 3.410  2.490        Latest Ref Rng & Units 08/12/2022   10:22 AM  Renin/Aldosterone   Aldosterone 0.0 - 30.0 ng/dL 40.9   Aldos/Renin Ratio 0.0 - 30.0 10.6        Latest Ref Rng & Units 08/12/2022   10:22 AM  Metanephrines/Catecholamines   Metanephrines 0.0 - 88.0 pg/mL 42.0   Normetanephrines  0.0 - 218.9 pg/mL 201.1           12/21/2022    2:54 PM  Renovascular   Renal Artery [place] Completed Yes     Disposition:    FU with APP/PharmD in 1 month for the next 3 months.   FU with Drystan Reader C. Duke Salvia, MD, [facility] in 4 months.  Medication Adjustments/Labs and Tests Ordered: Current medicines are reviewed at length with the patient today.  Concerns regarding medicines are outlined above.   Orders Placed This Encounter  Procedures   VAS US RENAL  ARTERY DUPLEX   Meds ordered this encounter  Medications   amLODipine (NORVASC) 5 MG tablet    Sig: Take 1 tablet (5 mg total) by mouth daily.    Dispense:  90 tablet    Refill:  3   I,Mathew Stumpf,acting as a scribe for Chilton Si, MD.,have documented all relevant documentation on the behalf of Chilton Si, MD,as directed by  Chilton Si, MD while in the presence of Chilton Si, MD.  I, Lise Pincus C. Duke Salvia, MD have reviewed all documentation for this visit.  The documentation of the exam, diagnosis, procedures, and orders on 12/21/2022 are all accurate and complete.   Signed, Chilton Si, MD  12/21/2022 4:13 PM    [facility]

## 2022-12-21 NOTE — Patient Instructions (Addendum)
Medication Instructions:  START AMLODIPINE 5 MG DAILY    Labwork: NONE   Testing/Procedures: Your physician has requested that you have a renal artery duplex. During this test, an ultrasound is used to evaluate blood flow to the kidneys. Allow one hour for this exam. Do not eat after midnight the day before and avoid carbonated beverages. Take your medications as you usually do.   Follow-Up: 01/27/2023 1:30 pm with Pharm D    Special Instructions:  MONITOR YOUR BLOOD PRESSURE TWICE A DAY, LOG IN THE BOOK PROVIDED. BRING THE BOOK AND YOUR BLOOD PRESSURE MACHINE TO YOUR FOLLOW UP IN 1 MONTH   DASH Eating Plan DASH stands for "Dietary Approaches to Stop Hypertension." The DASH eating plan is a healthy eating plan that has been shown to reduce high blood pressure (hypertension). It may also reduce your risk for type 2 diabetes, heart disease, and stroke. The DASH eating plan may also help with weight loss. What are tips for following this plan?  General guidelines Avoid eating more than 2,300 mg (milligrams) of salt (sodium) a day. If you have hypertension, you may need to reduce your sodium intake to 1,500 mg a day. Limit alcohol intake to no more than 1 drink a day for nonpregnant women and 2 drinks a day for men. One drink equals 12 oz of beer, 5 oz of wine, or 1 oz of hard liquor. Work with your health care provider to maintain a healthy body weight or to lose weight. Ask what an ideal weight is for you. Get at least 30 minutes of exercise that causes your heart to beat faster (aerobic exercise) most days of the week. Activities may include walking, swimming, or biking. Work with your health care provider or diet and nutrition specialist (dietitian) to adjust your eating plan to your individual calorie needs. Reading food labels  Check food labels for the amount of sodium per serving. Choose foods with less than 5 percent of the Daily Value of sodium. Generally, foods with less than 300 mg  of sodium per serving fit into this eating plan. To find whole grains, look for the word "whole" as the first word in the ingredient list. Shopping Buy products labeled as "low-sodium" or "no salt added." Buy fresh foods. Avoid canned foods and premade or frozen meals. Cooking Avoid adding salt when cooking. Use salt-free seasonings or herbs instead of table salt or sea salt. Check with your health care provider or pharmacist before using salt substitutes. Do not fry foods. Cook foods using healthy methods such as baking, boiling, grilling, and broiling instead. Cook with heart-healthy oils, such as olive, canola, soybean, or sunflower oil. Meal planning Eat a balanced diet that includes: 5 or more servings of fruits and vegetables each day. At each meal, try to fill half of your plate with fruits and vegetables. Up to 6-8 servings of whole grains each day. Less than 6 oz of lean meat, poultry, or fish each day. A 3-oz serving of meat is about the same size as a deck of cards. One egg equals 1 oz. 2 servings of low-fat dairy each day. A serving of nuts, seeds, or beans 5 times each week. Heart-healthy fats. Healthy fats called Omega-3 fatty acids are found in foods such as flaxseeds and coldwater fish, like sardines, salmon, and mackerel. Limit how much you eat of the following: Canned or prepackaged foods. Food that is high in trans fat, such as fried foods. Food that is high in saturated fat,  such as fatty meat. Sweets, desserts, sugary drinks, and other foods with added sugar. Full-fat dairy products. Do not salt foods before eating. Try to eat at least 2 vegetarian meals each week. Eat more home-cooked food and less restaurant, buffet, and fast food. When eating at a restaurant, ask that your food be prepared with less salt or no salt, if possible. What foods are recommended? The items listed may not be a complete list. Talk with your dietitian about what dietary choices are best for  you. Grains Whole-grain or whole-wheat bread. Whole-grain or whole-wheat pasta. Brown rice. Orpah Cobb. Bulgur. Whole-grain and low-sodium cereals. Pita bread. Low-fat, low-sodium crackers. Whole-wheat flour tortillas. Vegetables Fresh or frozen vegetables (raw, steamed, roasted, or grilled). Low-sodium or reduced-sodium tomato and vegetable juice. Low-sodium or reduced-sodium tomato sauce and tomato paste. Low-sodium or reduced-sodium canned vegetables. Fruits All fresh, dried, or frozen fruit. Canned fruit in natural juice (without added sugar). Meat and other protein foods Skinless chicken or [place]. Ground chicken or [place]. Pork with fat trimmed off. Fish and seafood. Egg whites. Dried beans, peas, or lentils. Unsalted nuts, nut butters, and seeds. Unsalted canned beans. Lean cuts of beef with fat trimmed off. Low-sodium, lean deli meat. Dairy Low-fat (1%) or fat-free (skim) milk. Fat-free, low-fat, or reduced-fat cheeses. Nonfat, low-sodium ricotta or cottage cheese. Low-fat or nonfat yogurt. Low-fat, low-sodium cheese. Fats and oils Soft margarine without trans fats. Vegetable oil. Low-fat, reduced-fat, or light mayonnaise and salad dressings (reduced-sodium). Canola, safflower, olive, soybean, and sunflower oils. Avocado. Seasoning and other foods Herbs. Spices. Seasoning mixes without salt. Unsalted popcorn and pretzels. Fat-free sweets. What foods are not recommended? The items listed may not be a complete list. Talk with your dietitian about what dietary choices are best for you. Grains Baked goods made with fat, such as croissants, muffins, or some breads. Dry pasta or rice meal packs. Vegetables Creamed or fried vegetables. Vegetables in a cheese sauce. Regular canned vegetables (not low-sodium or reduced-sodium). Regular canned tomato sauce and paste (not low-sodium or reduced-sodium). Regular tomato and vegetable juice (not low-sodium or reduced-sodium). Rosita Fire.  Olives. Fruits Canned fruit in a light or heavy syrup. Fried fruit. Fruit in cream or butter sauce. Meat and other protein foods Fatty cuts of meat. Ribs. Fried meat. Tomasa Blase. Sausage. Bologna and other processed lunch meats. Salami. Fatback. Hotdogs. Bratwurst. Salted nuts and seeds. Canned beans with added salt. Canned or smoked fish. Whole eggs or egg yolks. Chicken or [place] with skin. Dairy Whole or 2% milk, cream, and half-and-half. Whole or full-fat cream cheese. Whole-fat or sweetened yogurt. Full-fat cheese. Nondairy creamers. Whipped toppings. Processed cheese and cheese spreads. Fats and oils Butter. Stick margarine. Lard. Shortening. Ghee. Bacon fat. Tropical oils, such as coconut, palm kernel, or palm oil. Seasoning and other foods Salted popcorn and pretzels. Onion salt, garlic salt, seasoned salt, table salt, and sea salt. Worcestershire sauce. Tartar sauce. Barbecue sauce. Teriyaki sauce. Soy sauce, including reduced-sodium. Steak sauce. Canned and packaged gravies. Fish sauce. Oyster sauce. Cocktail sauce. Horseradish that you find on the shelf. Ketchup. Mustard. Meat flavorings and tenderizers. Bouillon cubes. Hot sauce and Tabasco sauce. Premade or packaged marinades. Premade or packaged taco seasonings. Relishes. Regular salad dressings. Where to find more information: National Heart, Lung, and Blood Institute: [employer]Ste[employer].is American Heart Association: www.heart.org Summary The DASH eating plan is a healthy eating plan that has been shown to reduce high blood pressure (hypertension). It may also reduce your risk for type 2 diabetes, heart disease, and stroke.  With the DASH eating plan, you should limit salt (sodium) intake to 2,300 mg a day. If you have hypertension, you may need to reduce your sodium intake to 1,500 mg a day. When on the DASH eating plan, aim to eat more fresh fruits and vegetables, whole grains, lean proteins, low-fat dairy, and heart-healthy fats. Work with  your health care provider or diet and nutrition specialist (dietitian) to adjust your eating plan to your individual calorie needs. This information is not intended to replace advice given to you by your health care provider. Make sure you discuss any questions you have with your health care provider. Document Released: 07/07/2011 Document Revised: 06/30/2017 Document Reviewed: 07/11/2016 Elsevier Patient Education  2020 [employer].

## 2022-12-28 ENCOUNTER — Other Ambulatory Visit: Payer: Self-pay | Admitting: Neurology

## 2022-12-29 NOTE — Progress Notes (Deleted)
Office Visit Note  Patient: [name]             Date of Birth: 1976/02/15           MRN: [id number]             PCP: Mort Sawyers, FNP Referring: Mort Sawyers, FNP Visit Date: 01/12/2023 Occupation: @GUAROCC @  Subjective:  No chief complaint on file.   History of Present Illness: [name] is a 47 y.o. female ***     Activities of Daily Living:  Patient reports morning stiffness for *** {minute/hour:19697}.   Patient {ACTIONS;DENIES/REPORTS:21021675::"Denies"} nocturnal pain.  Difficulty dressing/grooming: {ACTIONS;DENIES/REPORTS:21021675::"Denies"} Difficulty climbing stairs: {ACTIONS;DENIES/REPORTS:21021675::"Denies"} Difficulty getting out of chair: {ACTIONS;DENIES/REPORTS:21021675::"Denies"} Difficulty using hands for taps, buttons, cutlery, and/or writing: {ACTIONS;DENIES/REPORTS:21021675::"Denies"}  No Rheumatology ROS completed.   PMFS History:  Patient Active Problem List   Diagnosis Date Noted   Vitamin D deficiency 12/01/2022   Diarrhea 08/30/2022   Prediabetes 08/30/2022   Snoring 04/28/2022   Murmur 12/31/2021   Proteinuria 12/31/2021   Dermatitis 04/13/2021   Muscle cramps 02/27/2020   Essential hypertension 08/14/2018   Obesity, Class III, BMI 40-49.9 (morbid obesity) (HCC) 08/14/2018    Past Medical History:  Diagnosis Date   Hypertension    Murmur     Family History  Problem Relation Age of Onset   Heart disease Mother    Diabetes Mother    Hypertension Mother    Diabetes Father    Hypertension Father    Hypertension Sister    Autism Sister    Healthy Daughter    Healthy Daughter    Healthy Son    Past Surgical History:  Procedure Laterality Date   ABDOMINOPLASTY     ACHILLES TENDON SURGERY  06/08/2012   Procedure: ACHILLES TENDON REPAIR;  Surgeon: Nadara Mustard, MD;  Location: [facility];  Service: Orthopedics;  Laterality: Right;  Right Achilles Reconstruction   APPENDECTOMY     CHOLECYSTECTOMY     HYSTEROSCOPY  WITH D & C N/A 03/06/2014   Procedure: DILATATION AND CURETTAGE With IUD Removal;  Surgeon: Serita Kyle, MD;  Location: [facility];  Service: Gynecology;  Laterality: N/A;   KNEE SURGERY     right knee arthroscropic   Social History   Social History Narrative      3 kids - ages 67 - 70   Husband - Renae Fickle   Enjoys: watching her kids play sports, spending time with husband   Exercise: not regular now - hoping to get back to it   Diet: not get currently, but hoping to do better   Immunization History  Administered Date(s) Administered   DTaP 07/12/1977, 10/10/1977, 12/15/1977, 12/06/1978, 04/02/1982   Hepatitis A 05/13/2014, 06/13/2014   Hepatitis A, Ped/Adol-2 Dose 01/19/2015   Hepatitis B 05/13/2014, 06/13/2014, 01/19/2015   IPV 07/12/1977, 10/10/1977, 12/15/1977, 04/02/1982   Influenza,inj,Quad PF,6+ Mos 04/28/2022   MMR 07/17/1978, 12/06/1978   PFIZER(Purple Top)SARS-COV-2 Vaccination 09/24/2019, 10/14/2019, 06/10/2020   Td 03/07/1995   Tdap 06/24/2012   Varicella 05/13/2014, 06/13/2014     Objective: Vital Signs: There were no vitals taken for this visit.   Physical Exam   Musculoskeletal Exam: ***  CDAI Exam: CDAI Score: -- Patient Global: --; Provider Global: -- Swollen: --; Tender: -- Joint Exam 01/12/2023   No joint exam has been documented for this visit   There is currently no information documented on the homunculus. Go to the Rheumatology activity and complete the homunculus joint exam.  Investigation: No additional  findings.  Imaging: No results found.  Recent Labs: Lab Results  Component Value Date   WBC 4.0 06/27/2022   HGB 13.8 08/12/2022   PLT 280 06/27/2022   NA 141 11/29/2022   K 4.8 11/29/2022   CL 104 11/29/2022   CO2 20 11/29/2022   GLUCOSE 96 11/29/2022   BUN 10 11/29/2022   CREATININE 1.10 (H) 11/29/2022   BILITOT 0.5 11/29/2022   ALKPHOS 57 11/29/2022   AST 25 11/29/2022   ALT 42 (H) 11/29/2022   PROT 6.8 11/29/2022    ALBUMIN 4.4 11/29/2022   CALCIUM 9.3 11/29/2022   GFRAA >60 08/09/2018   QFTBGOLDPLUS NEGATIVE 05/24/2022    Speciality Comments: No specialty comments available.  Procedures:  No procedures performed Allergies: Bee venom   Assessment / Plan:     Visit Diagnoses: No diagnosis found.  Orders: No orders of the defined types were placed in this encounter.  No orders of the defined types were placed in this encounter.   Face-to-face time spent with patient was *** minutes. Greater than 50% of time was spent in counseling and coordination of care.  Follow-Up Instructions: No follow-ups on file.   Ellen Henri, CMA  Note - This record has been created using Animal nutritionist.  Chart creation errors have been sought, but may not always  have been located. Such creation errors do not reflect on  the standard of medical care.

## 2022-12-31 ENCOUNTER — Other Ambulatory Visit: Payer: Self-pay | Admitting: Family

## 2022-12-31 DIAGNOSIS — M62838 Other muscle spasm: Secondary | ICD-10-CM

## 2023-01-04 ENCOUNTER — Inpatient Hospital Stay: Payer: BC Managed Care – PPO | Attending: Hematology and Oncology

## 2023-01-04 ENCOUNTER — Other Ambulatory Visit: Payer: Self-pay | Admitting: Hematology and Oncology

## 2023-01-04 DIAGNOSIS — D472 Monoclonal gammopathy: Secondary | ICD-10-CM

## 2023-01-04 DIAGNOSIS — I10 Essential (primary) hypertension: Secondary | ICD-10-CM | POA: Diagnosis not present

## 2023-01-04 LAB — CBC WITH DIFFERENTIAL (CANCER CENTER ONLY)
Abs Immature Granulocytes: 0.01 10*3/uL (ref 0.00–0.07)
Basophils Absolute: 0 10*3/uL (ref 0.0–0.1)
Basophils Relative: 1 %
Eosinophils Absolute: 0.1 10*3/uL (ref 0.0–0.5)
Eosinophils Relative: 2 %
HCT: 43 % (ref 36.0–46.0)
Hemoglobin: 13.8 g/dL (ref 12.0–15.0)
Immature Granulocytes: 0 %
Lymphocytes Relative: 31 %
Lymphs Abs: 1.5 10*3/uL (ref 0.7–4.0)
MCH: 27.1 pg (ref 26.0–34.0)
MCHC: 32.1 g/dL (ref 30.0–36.0)
MCV: 84.3 fL (ref 80.0–100.0)
Monocytes Absolute: 0.4 10*3/uL (ref 0.1–1.0)
Monocytes Relative: 9 %
Neutro Abs: 2.8 10*3/uL (ref 1.7–7.7)
Neutrophils Relative %: 57 %
Platelet Count: 277 10*3/uL (ref 150–400)
RBC: 5.1 MIL/uL (ref 3.87–5.11)
RDW: 14.6 % (ref 11.5–15.5)
WBC Count: 4.9 10*3/uL (ref 4.0–10.5)
nRBC: 0 % (ref 0.0–0.2)

## 2023-01-04 LAB — CMP (CANCER CENTER ONLY)
ALT: 54 U/L — ABNORMAL HIGH (ref 0–44)
AST: 33 U/L (ref 15–41)
Albumin: 4.4 g/dL (ref 3.5–5.0)
Alkaline Phosphatase: 46 U/L (ref 38–126)
Anion gap: 5 (ref 5–15)
BUN: 11 mg/dL (ref 6–20)
CO2: 25 mmol/L (ref 22–32)
Calcium: 9.1 mg/dL (ref 8.9–10.3)
Chloride: 107 mmol/L (ref 98–111)
Creatinine: 1.01 mg/dL — ABNORMAL HIGH (ref 0.44–1.00)
GFR, Estimated: >60 mL/min (ref >60)
Glucose, Bld: 95 mg/dL (ref 70–99)
Potassium: 4.2 mmol/L (ref 3.5–5.1)
Sodium: 137 mmol/L (ref 135–145)
Total Bilirubin: 0.8 mg/dL (ref 0.3–1.2)
Total Protein: 7.6 g/dL (ref 6.5–8.1)

## 2023-01-04 LAB — LACTATE DEHYDROGENASE: LDH: 222 U/L — ABNORMAL HIGH (ref 98–192)

## 2023-01-05 LAB — KAPPA/LAMBDA LIGHT CHAINS
Kappa free light chain: 10.6 mg/L (ref 3.3–19.4)
Kappa, lambda light chain ratio: 1.34 (ref 0.26–1.65)
Lambda free light chains: 7.9 mg/L (ref 5.7–26.3)

## 2023-01-10 LAB — MULTIPLE MYELOMA PANEL, SERUM
Albumin SerPl Elph-Mcnc: 3.6 g/dL (ref 2.9–4.4)
Albumin/Glob SerPl: 1.2 (ref 0.7–1.7)
Alpha 1: 0.3 g/dL (ref 0.0–0.4)
Alpha2 Glob SerPl Elph-Mcnc: 0.6 g/dL (ref 0.4–1.0)
B-Globulin SerPl Elph-Mcnc: 1.2 g/dL (ref 0.7–1.3)
Gamma Glob SerPl Elph-Mcnc: 1.1 g/dL (ref 0.4–1.8)
Globulin, Total: 3.2 g/dL (ref 2.2–3.9)
IgA: 123 mg/dL (ref 87–352)
IgG (Immunoglobin G), Serum: 1289 mg/dL (ref 586–1602)
IgM (Immunoglobulin M), Srm: 73 mg/dL (ref 26–217)
M Protein SerPl Elph-Mcnc: 0.6 g/dL — ABNORMAL HIGH
Total Protein ELP: 6.8 g/dL (ref 6.0–8.5)

## 2023-01-11 ENCOUNTER — Inpatient Hospital Stay (HOSPITAL_BASED_OUTPATIENT_CLINIC_OR_DEPARTMENT_OTHER): Payer: BC Managed Care – PPO | Admitting: Hematology and Oncology

## 2023-01-11 ENCOUNTER — Other Ambulatory Visit: Payer: Self-pay

## 2023-01-11 VITALS — BP 157/100 | HR 70 | Temp 98.2°F | Resp 17 | Wt 336.8 lb

## 2023-01-11 DIAGNOSIS — D472 Monoclonal gammopathy: Secondary | ICD-10-CM

## 2023-01-11 NOTE — Progress Notes (Signed)
[facility] [phone]   [phone]  PROGRESS NOTE  Patient Care Team: Mort Sawyers, FNP as PCP - General (Family Medicine)  Hematological/Oncological History # IgG Lambda Monoclonal Gammopathy  05/24/2022: SPEP showed M protein 0.4 with IgG lambda specificity on IFE 06/27/2022: establish care with Dr. Leonides Schanz   Interval History:  [name] 47 y.o. female with medical history significant for IgG lambda MGUS who presents for a follow up visit. The patient's last visit was on 06/27/2022 at which time she established care. In the interim since the last visit she has had no major changes in her health.  On exam today [name] reports she is still having many of the same issues that she did during her last visit.  He reports her energy levels are "okay".  She is now receiving vitamin D supplementation.  She reports that she was diagnosed with sleep apnea and will soon have a CPAP machine.  She notes that her blood pressure has still been quite high and she is following with the hypertension clinic.  They are planning an renal ultrasound later this month.  She knows she continues to have pain in her hips and legs as well as her knee.  She otherwise denies any frank bone or back pain.  She notes no fevers, chills, sweats, nausea, vomiting or diarrhea.  A full 10 point ROS is otherwise negative.  MEDICAL HISTORY:  Past Medical History:  Diagnosis Date   Hypertension    Murmur     SURGICAL HISTORY: Past Surgical History:  Procedure Laterality Date   ABDOMINOPLASTY     ACHILLES TENDON SURGERY  06/08/2012   Procedure: ACHILLES TENDON REPAIR;  Surgeon: Nadara Mustard, MD;  Location: [facility];  Service: Orthopedics;  Laterality: Right;  Right Achilles Reconstruction   APPENDECTOMY     CHOLECYSTECTOMY     HYSTEROSCOPY WITH D & C N/A 03/06/2014   Procedure: DILATATION AND CURETTAGE With IUD Removal;  Surgeon: Serita Kyle, MD;  Location: [facility];  Service: Gynecology;  Laterality: N/A;   KNEE SURGERY     right knee arthroscropic    SOCIAL HISTORY: Social History   Socioeconomic History   Marital status: Married    Spouse name: Renae Fickle   Number of children: 3   Years of education: Nursing school   Highest education level: Not on file  Occupational History   Occupation: Teacher, adult education: [employer]  Tobacco Use   Smoking status: Never    Passive exposure: Never   Smokeless tobacco: Never  Vaping Use   Vaping Use: Never used  Substance and Sexual Activity   Alcohol use: Yes    Comment: once a month, 1-2 servings   Drug use: No   Sexual activity: Yes    Birth control/protection: I.U.D.  Other Topics Concern   Not on file  Social History Narrative      3 kids - ages 9 - 71   Husband - Renae Fickle   Enjoys: watching her kids play sports, spending time with husband   Exercise: not regular now - hoping to get back to it   Diet: not get currently, but hoping to do better   Social Determinants of Health   Financial Resource Strain: Low Risk  (08/09/2018)   Overall Financial Resource Strain (CARDIA)    Difficulty of Paying Living Expenses: Not hard at all  Food Insecurity: No Food Insecurity (12/21/2022)   Hunger Vital Sign    Worried  About Running Out of Food in the Last Year: Never true    Ran Out of Food in the Last Year: Never true  Transportation Needs: No Transportation Needs (12/21/2022)   PRAPARE - Administrator, Civil Service (Medical): No    Lack of Transportation (Non-Medical): No  Physical Activity: Inactive (12/21/2022)   Exercise Vital Sign    Days of Exercise per Week: 0 days    Minutes of Exercise per Session: 0 min  Stress: Not on file  Social Connections: Not on file  Intimate Partner Violence: Not on file    FAMILY HISTORY: Family History  Problem Relation Age of Onset   Heart disease Mother    Diabetes Mother    Hypertension Mother    Diabetes Father    Hypertension Father     Hypertension Sister    Autism Sister    Healthy Daughter    Healthy Daughter    Healthy Son     ALLERGIES:  is allergic to bee venom.  MEDICATIONS:  Current Outpatient Medications  Medication Sig Dispense Refill   amLODipine (NORVASC) 5 MG tablet Take 1 tablet (5 mg total) by mouth daily. 90 tablet 3   DULoxetine (CYMBALTA) 30 MG capsule TAKE 1 CAPSULE BY MOUTH EVERY DAY 90 capsule 2   EPINEPHrine 0.3 mg/0.3 mL IJ SOAJ injection Inject 0.3 mg into the muscle as needed for anaphylaxis. 1 each 0   gabapentin (NEURONTIN) 300 MG capsule Take 1 capsule (300 mg total) by mouth 3 (three) times daily. 90 capsule 11   hydrALAZINE (APRESOLINE) 25 MG tablet Take 1 tablet (25 mg total) by mouth 3 (three) times daily. 270 tablet 3   ibuprofen (ADVIL) 800 MG tablet Take 800 mg by mouth every 8 (eight) hours as needed.     labetalol (NORMODYNE) 200 MG tablet Take 1 tablet (200 mg total) by mouth 3 (three) times daily. 270 tablet 0   levonorgestrel (MIRENA) 20 MCG/24HR IUD 1 each by Intrauterine route once.     losartan (COZAAR) 50 MG tablet Take 1 tablet (50 mg total) by mouth in the morning. 90 tablet 3   scopolamine (TRANSDERM-SCOP) 1 MG/3DAYS Place 1 patch (1.5 mg total) onto the skin every 3 (three) days. 10 patch 12   tiZANidine (ZANAFLEX) 4 MG tablet TAKE 1/2 TO 1 TABLET BY MOUTH AT BEDTIME AS NEEDED FOR MUSCLE SPASM 90 tablet 0   Vitamin D, Ergocalciferol, (DRISDOL) 1.25 MG (50000 UNIT) CAPS capsule Take 1 capsule (50,000 Units total) by mouth every 7 (seven) days. 7 capsule 0   zolpidem (AMBIEN CR) 6.25 MG CR tablet Take 1 tablet (6.25 mg total) by mouth at bedtime as needed. 30 tablet 0   No current facility-administered medications for this visit.    REVIEW OF SYSTEMS:   Constitutional: ( - ) fevers, ( - )  chills , ( - ) night sweats Eyes: ( - ) blurriness of vision, ( - ) double vision, ( - ) watery eyes Ears, nose, mouth, throat, and face: ( - ) mucositis, ( - ) sore  throat Respiratory: ( - ) cough, ( - ) dyspnea, ( - ) wheezes Cardiovascular: ( - ) palpitation, ( - ) chest discomfort, ( - ) lower extremity swelling Gastrointestinal:  ( - ) nausea, ( - ) heartburn, ( - ) change in bowel habits Skin: ( - ) abnormal skin rashes Lymphatics: ( - ) new lymphadenopathy, ( - ) easy bruising Neurological: ( - ) numbness, ( - )  tingling, ( - ) new weaknesses Behavioral/Psych: ( - ) mood change, ( - ) new changes  All other systems were reviewed with the patient and are negative.  PHYSICAL EXAMINATION:   Vitals:   01/11/23 1430  BP: (!) 157/100  Pulse: 70  Resp: 17  Temp: 98.2 F (36.8 C)  SpO2: 100%   Filed Weights   01/11/23 1430  Weight: (!) 336 lb 12.8 oz (152.8 kg)    GENERAL: Well-appearing middle-aged [place] female, alert, no distress and comfortable SKIN: skin color, texture, turgor are normal, no rashes or significant lesions EYES: conjunctiva are pink and non-injected, sclera clear LUNGS: clear to auscultation and percussion with normal breathing effort HEART: regular rate & rhythm and no murmurs and no lower extremity edema Musculoskeletal: no cyanosis of digits and no clubbing  PSYCH: alert & oriented x 3, fluent speech NEURO: no focal motor/sensory deficits  LABORATORY DATA:  I have reviewed the data as listed    Latest Ref Rng & Units 01/04/2023    3:01 PM 08/12/2022   10:22 AM 06/27/2022    1:55 PM  CBC  WBC 4.0 - 10.5 K/uL 4.9   4.0   Hemoglobin 12.0 - 15.0 g/dL 16.1  09.6  04.5   Hematocrit 36.0 - 46.0 % 43.0  42.6  40.5   Platelets 150 - 400 K/uL 277   280        Latest Ref Rng & Units 01/04/2023    3:01 PM 11/29/2022   10:38 AM 08/12/2022   10:22 AM  CMP  Glucose 70 - 99 mg/dL 95  96  409   BUN 6 - 20 mg/dL 11  10  13    Creatinine 0.44 - 1.00 mg/dL 8.11  9.14  7.82   Sodium 135 - 145 mmol/L 137  141  138   Potassium 3.5 - 5.1 mmol/L 4.2  4.8  4.7   Chloride 98 - 111 mmol/L 107  104  104   CO2 22 - 32 mmol/L  25  20  16    Calcium 8.9 - 10.3 mg/dL 9.1  9.3  9.7   Total Protein 6.5 - 8.1 g/dL 7.6  6.8  7.4   Total Bilirubin 0.3 - 1.2 mg/dL 0.8  0.5  0.7   Alkaline Phos 38 - 126 U/L 46  57  57   AST 15 - 41 U/L 33  25  55   ALT 0 - 44 U/L 54  42  71     Lab Results  Component Value Date   MPROTEIN 0.6 (H) 01/04/2023   MPROTEIN 0.5 (H) 06/27/2022   Lab Results  Component Value Date   KPAFRELGTCHN 10.6 01/04/2023   KPAFRELGTCHN 13.4 06/27/2022   LAMBDASER 7.9 01/04/2023   LAMBDASER 10.1 06/27/2022   KAPLAMBRATIO 1.34 01/04/2023   KAPLAMBRATIO 1.33 06/27/2022    RADIOGRAPHIC STUDIES: No results found.  ASSESSMENT & PLAN JAZALYN MONDOR 47 y.o. female with medical history significant for IgG lambda MGUS who presents for a follow up visit.  After review of the labs, review of the records, and discussion with the patient the patients findings are most consistent with a monoclonal gammopathy.    Monoclonal Gammopathies are a group of medical conditions defined by the presence of a monoclonal protein (an M protein) in the blood or urine. Monoclonal gammopathies include monoclonal gammopathy of unknown significance (MGUS), Monoclonal gammopathies of renal or neurological significance,  smoldering multiple myeloma (SMM), multiple myeloma (MM), AL amyloidosis, and Waldenstrom macroglobulinemia.  The goal of the initial workup is to determine which monoclonal gammopathy a patient has. The workup consists of evaluating protein in the serum (with serum protein electrophoresis (SPEP) and serum free light chains) , evaluating protein in the urine (UPEP), and evaluation of the skeleton (DG Bone Met Survey) to assure no lytic lesions. Baseline bloodwork includes CMP and CBC. If no CRAB criteria or high risk criteria are noted then the diagnosis is MGUS. MGUS must be followed with bloodwork periodically to assure it does not convert to multiple myeloma (occurs to approximately 1% of patients per year). If  there are CRAB criteria or high risk features (such as elevated serum free light chain ratio (taking into account renal function), a non IgG M protein, or M protein >1.5) then a bone marrow biopsy must be pursued.     #IgG Lambda Monoclonal Gammopathy of Undetermined Significance --at each visit will order SPEP, SFLC and CBC, CMP, and LDH --metastatic bone survey showed no evidence of lytic lesions --UPEP has not yet been performed, have requested that she pick up the jug today. --Labs last week showed white blood cell count 4.9, hemoglobin 13.8, MCV 84.3, and platelets of 277.  M protein was 0.6 with kappa 10.6, lambda 7.9, and ratio of 1.34 --No clear indication for bone marrow biopsy at this time -- Return to clinic in 6 months time with labs 1 week before  No orders of the defined types were placed in this encounter.   All questions were answered. The patient knows to call the clinic with any problems, questions or concerns.  A total of more than 30 minutes were spent on this encounter with face-to-face time and non-face-to-face time, including preparing to see the patient, ordering tests and/or medications, counseling the patient and coordination of care as outlined above.   Ulysees Barns, MD Department of Hematology/Oncology [facility] Phone: [phone] Pager: [phone] Email: [email or web address]  01/11/2023 2:55 PM

## 2023-01-12 ENCOUNTER — Ambulatory Visit: Payer: BC Managed Care – PPO | Admitting: Rheumatology

## 2023-01-12 DIAGNOSIS — Z6841 Body Mass Index (BMI) 40.0 and over, adult: Secondary | ICD-10-CM

## 2023-01-12 DIAGNOSIS — F5101 Primary insomnia: Secondary | ICD-10-CM

## 2023-01-12 DIAGNOSIS — R778 Other specified abnormalities of plasma proteins: Secondary | ICD-10-CM

## 2023-01-12 DIAGNOSIS — G8929 Other chronic pain: Secondary | ICD-10-CM

## 2023-01-12 DIAGNOSIS — R7611 Nonspecific reaction to tuberculin skin test without active tuberculosis: Secondary | ICD-10-CM

## 2023-01-12 DIAGNOSIS — R252 Cramp and spasm: Secondary | ICD-10-CM

## 2023-01-12 DIAGNOSIS — R5383 Other fatigue: Secondary | ICD-10-CM

## 2023-01-12 DIAGNOSIS — R748 Abnormal levels of other serum enzymes: Secondary | ICD-10-CM

## 2023-01-12 DIAGNOSIS — Z840 Family history of diseases of the skin and subcutaneous tissue: Secondary | ICD-10-CM

## 2023-01-12 DIAGNOSIS — M7061 Trochanteric bursitis, right hip: Secondary | ICD-10-CM

## 2023-01-12 DIAGNOSIS — I10 Essential (primary) hypertension: Secondary | ICD-10-CM

## 2023-01-12 DIAGNOSIS — M25579 Pain in unspecified ankle and joints of unspecified foot: Secondary | ICD-10-CM

## 2023-01-12 DIAGNOSIS — E559 Vitamin D deficiency, unspecified: Secondary | ICD-10-CM

## 2023-01-23 ENCOUNTER — Telehealth (HOSPITAL_BASED_OUTPATIENT_CLINIC_OR_DEPARTMENT_OTHER): Payer: Self-pay | Admitting: Cardiovascular Disease

## 2023-01-23 ENCOUNTER — Encounter (HOSPITAL_BASED_OUTPATIENT_CLINIC_OR_DEPARTMENT_OTHER): Payer: BC Managed Care – PPO

## 2023-01-23 ENCOUNTER — Ambulatory Visit: Payer: BC Managed Care – PPO | Admitting: Cardiology

## 2023-01-23 NOTE — Telephone Encounter (Signed)
Left message for patient to call and discuss rescheduling the 01/23/23 renal artery duplex appointment

## 2023-01-27 ENCOUNTER — Ambulatory Visit (INDEPENDENT_AMBULATORY_CARE_PROVIDER_SITE_OTHER): Payer: BC Managed Care – PPO | Admitting: Pharmacist Clinician (PhC)/ Clinical Pharmacy Specialist

## 2023-01-27 ENCOUNTER — Encounter (HOSPITAL_BASED_OUTPATIENT_CLINIC_OR_DEPARTMENT_OTHER): Payer: Self-pay | Admitting: Pharmacist Clinician (PhC)/ Clinical Pharmacy Specialist

## 2023-01-27 DIAGNOSIS — I10 Essential (primary) hypertension: Secondary | ICD-10-CM | POA: Diagnosis not present

## 2023-01-27 MED ORDER — OLMESARTAN MEDOXOMIL 40 MG PO TABS: 40.0000 mg | ORAL_TABLET | Freq: Every day | ORAL | 3 refills | Status: DC

## 2023-01-27 MED ORDER — LABETALOL HCL 200 MG PO TABS
300.0000 mg | ORAL_TABLET | Freq: Two times a day (BID) | ORAL | 0 refills | Status: DC
Start: 2023-01-27 — End: 2023-07-29

## 2023-01-27 NOTE — Assessment & Plan Note (Signed)
Assessment: BP is uncontrolled in office BP 138/89 mmHg;  above the goal (<130/80). Home diastolic average is > 100 both morning and night - had discussion on importance of bringing this down  Tolerates current medications well without any side effects Denies SOB, palpitation, chest pain, headaches,or swelling Reiterated the importance of regular exercise and low salt diet   Plan:  Stop taking losartan  Start taking olmesartan 40 mg once daily Switch labetalol to 300 mg bid  Continue taking amlodipine and hydralazine Patient to keep record of BP readings with heart rate and report to [place] at the next visit Patient to follow up with PharmD in 1 month  Labs ordered today:  none

## 2023-01-27 NOTE — Patient Instructions (Signed)
Follow up appointment: Monday August 5 at 1:30 pm  Take your BP meds as follows:  Stop losartan  Start olmesartan 40 mg once daily  Switch labetalol to 300 mg twice daily   Check your blood pressure at home daily (if able) and keep record of the readings.  Hypertension "High blood pressure"  Hypertension is often called "The Silent Killer." It rarely causes symptoms until it is extremely  high or has done damage to other organs in the body. For this reason, you should have your  blood pressure checked regularly by your physician. We will check your blood pressure  every time you see a provider at one of our offices.   Your blood pressure reading consists of two numbers. Ideally, blood pressure should be  below 120/80. The first ("top") number is called the systolic pressure. It measures the  pressure in your arteries as your heart beats. The second ("bottom") number is called the diastolic pressure. It measures the pressure in your arteries as the heart relaxes between beats.  The benefits of getting your blood pressure under control are enormous. A 10-point  reduction in systolic blood pressure can reduce your risk of stroke by 27% and heart failure by 28%  Your blood pressure goal is < 130/80  To check your pressure at home you will need to:  1. Sit up in a chair, with feet flat on the floor and back supported. Do not cross your ankles or legs. 2. Rest your left arm so that the cuff is about heart level. If the cuff goes on your upper arm,  then just relax the arm on the table, arm of the chair or your lap. If you have a wrist cuff, we  suggest relaxing your wrist against your chest (think of it as Pledging the Flag with the  wrong arm).  3. Place the cuff snugly around your arm, about 1 inch above the crook of your elbow. The  cords should be inside the groove of your elbow.  4. Sit quietly, with the cuff in place, for about 5 minutes. After that 5 minutes press the power   button to start a reading. 5. Do not talk or move while the reading is taking place.  6. Record your readings on a sheet of paper. Although most cuffs have a memory, it is often  easier to see a pattern developing when the numbers are all in front of you.  7. You can repeat the reading after 1-3 minutes if it is recommended  Make sure your bladder is empty and you have not had caffeine or tobacco within the last 30 min  Always bring your blood pressure log with you to your appointments. If you have not brought your monitor in to be double checked for accuracy, please bring it to your next appointment.  You can find a list of quality blood pressure cuffs at [email or web address]

## 2023-01-27 NOTE — Progress Notes (Unsigned)
Office Visit    Patient Name: [name] Date of Encounter: 01/27/2023  Primary Care Provider:  Mort Sawyers, FNP Primary Cardiologist:  None  Chief Complaint    Hypertension - Advanced hypertension clinic  Past Medical History   HLD   OSA Sleep study ordered  obesity BMI 49.8          Allergies  Allergen Reactions   Bee Venom Anaphylaxis    History of Present Illness    [name] is a 47 y.o. female patient who was referred to the Advanced Hypertension Clinic by Dr. Tessa Salazar of [facility].  When Dr. Duke Salazar met her on May 22, her pressure was 156/92.  She reported first being diagnosed 4-5 years ago.   Has been intolerant to multiple medications secondary to muscle cramping.  States since starting gabapentin the cramping has decreased and she has been tolerating medications better.  Of note she has a chronically elevated CK and is working with her MD to figure out how to treat.  Dr. Duke Salazar started her back on amlodipine 5 mg daily in addition to her hydralazine, labetalol and losartan.    Today she returns for follow up.  She admits to having been out of labetalol for about a week and because she had 10 mg tablets of amlodipine at home, started that when she was put back on the 5 mg dose by this office.  She has been doing well with her medications and notes that muscle cramping is no worse than before.    Blood Pressure Goal:  130/80  Current Medications: amlodipine 10 mg every day, hydralazine 25 mg tid, labetalol 200 mg tid, losartan 50 mg every day  Previously tried:     Family Hx:   both parents had hypertension - father deceased unknown cause in his 57's; sister(autistic) on amlodpine; 3 kids healthy  Social Hx:      Tobacco: no  Alcohol:sociallly  Caffeine:  caffeine free soda  Diet:   eat out more than she should;  fast foods; some salads; doesn't snack much; variety of meats   Exercise: limited by knees  Home BP  readings:  AM 27 readings average 153/101  HR 76  (range 133-173/82-127)   PM 21 readings average 156/104  HR 80  (range 143-179/89-114)   Accessory Clinical Findings    Lab Results  Component Value Date   CREATININE 1.01 (H) 01/04/2023   BUN 11 01/04/2023   NA 137 01/04/2023   K 4.2 01/04/2023   CL 107 01/04/2023   CO2 25 01/04/2023   Lab Results  Component Value Date   ALT 54 (H) 01/04/2023   AST 33 01/04/2023   ALKPHOS 46 01/04/2023   BILITOT 0.8 01/04/2023   Lab Results  Component Value Date   HGBA1C 5.8 08/30/2022    Screening for Secondary Hypertension: { Click here to document screening for secondary causes of HTN  :1}     12/21/2022    2:35 PM  Causes  Drugs/Herbals Screened     - Comments + salt, no caffeine/ Rare EtOH.  Sleep Apnea Screened  Thyroid Disease Screened  Hyperaldosteronism Screened  Pheochromocytoma Screened  Cushing's Syndrome Screened  Hyperparathyroidism Screened  Coarctation of the Aorta Screened     - Comments R arm BP higher than the L  Compliance Screened    Relevant Labs/Studies:    Latest Ref Rng & Units 01/04/2023    3:01 PM 11/29/2022   10:38 AM 08/12/2022  10:22 AM  Basic Labs  Sodium 135 - 145 mmol/L 137  141  138   Potassium 3.5 - 5.1 mmol/L 4.2  4.8  4.7   Creatinine 0.44 - 1.00 mg/dL 1.19  1.47  8.29        Latest Ref Rng & Units 11/29/2022   10:38 AM 08/12/2022   10:22 AM  Thyroid   TSH 0.450 - 4.500 uIU/mL 3.410  2.490        Latest Ref Rng & Units 08/12/2022   10:22 AM  Renin/Aldosterone   Aldosterone 0.0 - 30.0 ng/dL 56.2   Aldos/Renin Ratio 0.0 - 30.0 10.6        Latest Ref Rng & Units 08/12/2022   10:22 AM  Metanephrines/Catecholamines   Metanephrines 0.0 - 88.0 pg/mL 42.0   Normetanephrines  0.0 - 218.9 pg/mL 201.1           12/21/2022    2:54 PM  Renovascular   Renal Artery [place] Completed Yes      Home Medications    Current Outpatient Medications  Medication Sig Dispense Refill   olmesartan  (BENICAR) 40 MG tablet Take 1 tablet (40 mg total) by mouth daily. 30 tablet 3   amLODipine (NORVASC) 5 MG tablet Take 1 tablet (5 mg total) by mouth daily. 90 tablet 3   DULoxetine (CYMBALTA) 30 MG capsule TAKE 1 CAPSULE BY MOUTH EVERY DAY 90 capsule 2   EPINEPHrine 0.3 mg/0.3 mL IJ SOAJ injection Inject 0.3 mg into the muscle as needed for anaphylaxis. 1 each 0   gabapentin (NEURONTIN) 300 MG capsule Take 1 capsule (300 mg total) by mouth 3 (three) times daily. 90 capsule 11   hydrALAZINE (APRESOLINE) 25 MG tablet Take 1 tablet (25 mg total) by mouth 3 (three) times daily. 270 tablet 3   ibuprofen (ADVIL) 800 MG tablet Take 800 mg by mouth every 8 (eight) hours as needed.     labetalol (NORMODYNE) 200 MG tablet Take 1.5 tablets (300 mg total) by mouth 2 (two) times daily. 270 tablet 0   levonorgestrel (MIRENA) 20 MCG/24HR IUD 1 each by Intrauterine route once.     scopolamine (TRANSDERM-SCOP) 1 MG/3DAYS Place 1 patch (1.5 mg total) onto the skin every 3 (three) days. 10 patch 12   tiZANidine (ZANAFLEX) 4 MG tablet TAKE 1/2 TO 1 TABLET BY MOUTH AT BEDTIME AS NEEDED FOR MUSCLE SPASM 90 tablet 0   Vitamin D, Ergocalciferol, (DRISDOL) 1.25 MG (50000 UNIT) CAPS capsule Take 1 capsule (50,000 Units total) by mouth every 7 (seven) days. 7 capsule 0   zolpidem (AMBIEN CR) 6.25 MG CR tablet Take 1 tablet (6.25 mg total) by mouth at bedtime as needed. 30 tablet 0   No current facility-administered medications for this visit.     Assessment & Plan       Essential hypertension Assessment: BP is uncontrolled in office BP 138/89 mmHg;  above the goal (<130/80). Home diastolic average is > 100 both morning and night - had discussion on importance of bringing this down  Tolerates current medications well without any side effects Denies SOB, palpitation, chest pain, headaches,or swelling Reiterated the importance of regular exercise and low salt diet   Plan:  Stop taking losartan  Start taking  olmesartan 40 mg once daily Switch labetalol to 300 mg bid  Continue taking amlodipine and hydralazine Patient to keep record of BP readings with heart rate and report to [place] at the next visit Patient to follow up with PharmD in  1 month  Labs ordered today:  none   Phillips Hay PharmD CPP [facility]  [address] [place], [place] [address] [phone]

## 2023-02-08 ENCOUNTER — Other Ambulatory Visit: Payer: Self-pay | Admitting: Family

## 2023-02-08 DIAGNOSIS — M62838 Other muscle spasm: Secondary | ICD-10-CM

## 2023-02-09 NOTE — Telephone Encounter (Signed)
Left message to return call to our office.  

## 2023-02-09 NOTE — Telephone Encounter (Signed)
Does pt take muscle relaxers nightly? And what for?

## 2023-02-09 NOTE — Telephone Encounter (Signed)
Pt called back returning Joellen's call. Told pt Dugal's response. Pt states she does she the muscle relaxers nightly due to chronic muscle pains & cramps she tends to have. Pt stated she had enough ZANAFLEX  to last her to her upcoming appt with Dugal on 7/30. Pt states she was really in need of the meds, ibuprofen (ADVIL) 800 MG tablet. Pt mentioned she will be leaving to go out of the country soon & need a refill on those meds instead. Call back # [phone]

## 2023-02-09 NOTE — Telephone Encounter (Signed)
Patient called in to follow up on this refill. She stated that she is going out the country tomorrow and wants this refilled by then. Thank you!

## 2023-02-09 NOTE — Telephone Encounter (Signed)
What does she take ibuprofen for? And how regularly does she take this? I don't see any notation on the chart why she would take this regularly and we did not prescribe this in the past.

## 2023-02-14 NOTE — Telephone Encounter (Signed)
 Noted  

## 2023-02-14 NOTE — Telephone Encounter (Signed)
 LMTCB

## 2023-02-15 ENCOUNTER — Encounter: Payer: Self-pay | Admitting: *Deleted

## 2023-02-15 NOTE — Telephone Encounter (Signed)
MyChart message also sent to patient in regards to the Ibuprofen refill request.

## 2023-02-16 ENCOUNTER — Institutional Professional Consult (permissible substitution) (HOSPITAL_BASED_OUTPATIENT_CLINIC_OR_DEPARTMENT_OTHER): Payer: BC Managed Care – PPO | Admitting: Cardiovascular Disease

## 2023-02-21 ENCOUNTER — Telehealth (HOSPITAL_BASED_OUTPATIENT_CLINIC_OR_DEPARTMENT_OTHER): Payer: Self-pay | Admitting: Cardiovascular Disease

## 2023-02-21 ENCOUNTER — Encounter (HOSPITAL_BASED_OUTPATIENT_CLINIC_OR_DEPARTMENT_OTHER): Payer: BC Managed Care – PPO

## 2023-02-21 NOTE — Telephone Encounter (Signed)
Left message for patient to call and discuss rescheduling the 02/21/23 missed Renal Duplex appointment

## 2023-02-23 NOTE — Telephone Encounter (Signed)
Left message for patient to call and discuss rescheduling the 02/21/23 Renal Artery Duplex appointment that was missed

## 2023-02-28 ENCOUNTER — Ambulatory Visit: Payer: BC Managed Care – PPO | Admitting: Family

## 2023-03-01 ENCOUNTER — Encounter (HOSPITAL_BASED_OUTPATIENT_CLINIC_OR_DEPARTMENT_OTHER): Payer: Self-pay

## 2023-03-01 ENCOUNTER — Encounter (HOSPITAL_BASED_OUTPATIENT_CLINIC_OR_DEPARTMENT_OTHER): Payer: Self-pay | Admitting: Cardiovascular Disease

## 2023-03-01 ENCOUNTER — Encounter: Payer: Self-pay | Admitting: Family

## 2023-03-01 NOTE — Telephone Encounter (Signed)
Left message for patient to call and discuss rescheduling the 02/21/23 renal artery duplex appointment that was missed.  Will also send My Chart message and mail letter requesting she contact the office

## 2023-03-02 ENCOUNTER — Other Ambulatory Visit: Payer: Self-pay | Admitting: Cardiology

## 2023-03-02 DIAGNOSIS — I10 Essential (primary) hypertension: Secondary | ICD-10-CM

## 2023-03-06 ENCOUNTER — Ambulatory Visit (HOSPITAL_BASED_OUTPATIENT_CLINIC_OR_DEPARTMENT_OTHER): Payer: BC Managed Care – PPO

## 2023-03-06 NOTE — Progress Notes (Deleted)
Office Visit    Patient Name: [name] Date of Encounter: 03/06/2023  Primary Care Provider:  Mort Sawyers, FNP Primary Cardiologist:  None  Chief Complaint    Hypertension - Advanced hypertension clinic  Past Medical History   HLD 1/24 LDL(d) 144  OSA Sleep study ordered  obesity BMI 49.8  PreDM 1/24 A1c 5.8       Allergies  Allergen Reactions   Bee Venom Anaphylaxis    History of Present Illness    [name] is a 47 y.o. female patient who was referred to the Advanced Hypertension Clinic by Dr. Tessa Lerner of [facility].  When Dr. Duke Salvia met her on May 22, her pressure was 156/92.  She reported first being diagnosed 4-5 years ago.   Has been intolerant to multiple medications secondary to muscle cramping.  States since starting gabapentin the cramping has decreased and she has been tolerating medications better.  Of note she has a chronically elevated CK and is working with her MD to figure out how to treat.  Dr. Duke Salvia started her back on amlodipine 5 mg daily in addition to her hydralazine, labetalol and losartan.  At last visit pressure was still elevated at 138/89, with home readings averaging 150's/100's.  Patient had self increased amlodipine to 10 mg when she ran out of labetalol for a week.  I stopped her losartan and started olmesartan 40 mg and adjusted labetalol from 200 mg tid to 300 mg bid for compliance.    Today she returns for follow up.   Blood Pressure Goal:  130/80  Current Medications: amlodipine 10 mg every day, hydralazine 25 mg tid, labetalol 200 mg tid, losartan 50 mg every day  Family Hx:   both parents had hypertension - father deceased unknown cause in his 28's; sister(autistic) on amlodpine; 3 kids healthy  Social Hx:      Tobacco: no  Alcohol:sociallly  Caffeine:  caffeine free soda  Diet:   eat out more than she should;  fast foods; some salads; doesn't snack much; variety of meats   Exercise:  limited by knees  Home BP readings:  AM 27 readings average 153/101  HR 76  (range 133-173/82-127)   PM 21 readings average 156/104  HR 80  (range 143-179/89-114)   Accessory Clinical Findings    Lab Results  Component Value Date   CREATININE 1.01 (H) 01/04/2023   BUN 11 01/04/2023   NA 137 01/04/2023   K 4.2 01/04/2023   CL 107 01/04/2023   CO2 25 01/04/2023   Lab Results  Component Value Date   ALT 54 (H) 01/04/2023   AST 33 01/04/2023   ALKPHOS 46 01/04/2023   BILITOT 0.8 01/04/2023   Lab Results  Component Value Date   HGBA1C 5.8 08/30/2022    Screening for Secondary Hypertension:      12/21/2022    2:35 PM  Causes  Drugs/Herbals Screened     - Comments + salt, no caffeine/ Rare EtOH.  Sleep Apnea Screened  Thyroid Disease Screened  Hyperaldosteronism Screened  Pheochromocytoma Screened  Cushing's Syndrome Screened  Hyperparathyroidism Screened  Coarctation of the Aorta Screened     - Comments R arm BP higher than the L  Compliance Screened    Relevant Labs/Studies:    Latest Ref Rng & Units 01/04/2023    3:01 PM 11/29/2022   10:38 AM 08/12/2022   10:22 AM  Basic Labs  Sodium 135 - 145 mmol/L 137  141  138  Potassium 3.5 - 5.1 mmol/L 4.2  4.8  4.7   Creatinine 0.44 - 1.00 mg/dL 1.61  0.96  0.45        Latest Ref Rng & Units 11/29/2022   10:38 AM 08/12/2022   10:22 AM  Thyroid   TSH 0.450 - 4.500 uIU/mL 3.410  2.490        Latest Ref Rng & Units 08/12/2022   10:22 AM  Renin/Aldosterone   Aldosterone 0.0 - 30.0 ng/dL 40.9   Aldos/Renin Ratio 0.0 - 30.0 10.6        Latest Ref Rng & Units 08/12/2022   10:22 AM  Metanephrines/Catecholamines   Metanephrines 0.0 - 88.0 pg/mL 42.0   Normetanephrines  0.0 - 218.9 pg/mL 201.1           12/21/2022    2:54 PM  Renovascular   Renal Artery [place] Completed Yes      Home Medications    Current Outpatient Medications  Medication Sig Dispense Refill   amLODipine (NORVASC) 5 MG tablet Take 1 tablet (5  mg total) by mouth daily. 90 tablet 3   DULoxetine (CYMBALTA) 30 MG capsule TAKE 1 CAPSULE BY MOUTH EVERY DAY 90 capsule 2   EPINEPHrine 0.3 mg/0.3 mL IJ SOAJ injection Inject 0.3 mg into the muscle as needed for anaphylaxis. 1 each 0   gabapentin (NEURONTIN) 300 MG capsule Take 1 capsule (300 mg total) by mouth 3 (three) times daily. 90 capsule 11   hydrALAZINE (APRESOLINE) 25 MG tablet TAKE 1 TABLET BY MOUTH THREE TIMES A DAY 270 tablet 1   ibuprofen (ADVIL) 800 MG tablet Take 800 mg by mouth every 8 (eight) hours as needed.     labetalol (NORMODYNE) 200 MG tablet Take 1.5 tablets (300 mg total) by mouth 2 (two) times daily. 270 tablet 0   levonorgestrel (MIRENA) 20 MCG/24HR IUD 1 each by Intrauterine route once.     olmesartan (BENICAR) 40 MG tablet Take 1 tablet (40 mg total) by mouth daily. 30 tablet 3   scopolamine (TRANSDERM-SCOP) 1 MG/3DAYS Place 1 patch (1.5 mg total) onto the skin every 3 (three) days. 10 patch 12   tiZANidine (ZANAFLEX) 4 MG tablet TAKE 1/2 TO 1 TABLET BY MOUTH AT BEDTIME AS NEEDED FOR MUSCLE SPASM 90 tablet 0   Vitamin D, Ergocalciferol, (DRISDOL) 1.25 MG (50000 UNIT) CAPS capsule Take 1 capsule (50,000 Units total) by mouth every 7 (seven) days. 7 capsule 0   zolpidem (AMBIEN CR) 6.25 MG CR tablet Take 1 tablet (6.25 mg total) by mouth at bedtime as needed. 30 tablet 0   No current facility-administered medications for this visit.     Assessment & Plan   No BP recorded.  {Refresh Note OR Click here to enter BP  :1}***   No problem-specific Assessment & Plan notes found for this encounter.   Phillips Hay PharmD CPP [facility]  [address] [place], [place] [address] [phone]

## 2023-03-23 ENCOUNTER — Telehealth (HOSPITAL_BASED_OUTPATIENT_CLINIC_OR_DEPARTMENT_OTHER): Payer: Self-pay | Admitting: *Deleted

## 2023-03-23 NOTE — Telephone Encounter (Signed)
-----   Message from Pitkin F sent at 03/23/2023 11:16 AM EDT ----- Regarding: Renal Duplex We have made several attempts to contact this patient including sending a letter to schedule or reschedule their Renal Duplex. We will be removing the patient from the  WQ.  Thank you

## 2023-03-23 NOTE — Telephone Encounter (Signed)
Will forward to Dr [place] so she will be aware ?

## 2023-03-29 ENCOUNTER — Other Ambulatory Visit: Payer: Self-pay | Admitting: Family

## 2023-03-29 DIAGNOSIS — M62838 Other muscle spasm: Secondary | ICD-10-CM

## 2023-04-07 ENCOUNTER — Encounter: Payer: Self-pay | Admitting: Family

## 2023-04-07 ENCOUNTER — Ambulatory Visit: Payer: BC Managed Care – PPO | Admitting: Family

## 2023-04-07 ENCOUNTER — Ambulatory Visit (INDEPENDENT_AMBULATORY_CARE_PROVIDER_SITE_OTHER)
Admission: RE | Admit: 2023-04-07 | Discharge: 2023-04-07 | Disposition: A | Discharge status: Home / Self Care | Payer: BC Managed Care – PPO | Source: Ambulatory Visit | Attending: Family | Admitting: Family

## 2023-04-07 VITALS — BP 118/80 | HR 74 | Temp 97.9°F | Ht 69.0 in | Wt 335.0 lb

## 2023-04-07 DIAGNOSIS — Z23 Encounter for immunization: Secondary | ICD-10-CM | POA: Diagnosis not present

## 2023-04-07 DIAGNOSIS — R7989 Other specified abnormal findings of blood chemistry: Secondary | ICD-10-CM

## 2023-04-07 DIAGNOSIS — M79662 Pain in left lower leg: Secondary | ICD-10-CM | POA: Insufficient documentation

## 2023-04-07 DIAGNOSIS — M79661 Pain in right lower leg: Secondary | ICD-10-CM | POA: Diagnosis not present

## 2023-04-07 DIAGNOSIS — I10 Essential (primary) hypertension: Secondary | ICD-10-CM

## 2023-04-07 DIAGNOSIS — R7402 Elevation of levels of lactic acid dehydrogenase (LDH): Secondary | ICD-10-CM

## 2023-04-07 DIAGNOSIS — R748 Abnormal levels of other serum enzymes: Secondary | ICD-10-CM | POA: Diagnosis not present

## 2023-04-07 DIAGNOSIS — G479 Sleep disorder, unspecified: Secondary | ICD-10-CM

## 2023-04-07 LAB — T4, FREE: Free T4: 0.64 ng/dL (ref 0.60–1.60)

## 2023-04-07 LAB — MAGNESIUM: Magnesium: 2 mg/dL (ref 1.5–2.5)

## 2023-04-07 LAB — CK: Total CK: 203 U/L — ABNORMAL HIGH (ref 7–177)

## 2023-04-07 LAB — TSH: TSH: 3.44 u[IU]/mL (ref 0.35–5.50)

## 2023-04-07 NOTE — Progress Notes (Signed)
Established Patient Office Visit  Subjective:      CC:  Chief Complaint  Patient presents with   Hypertension    HPI: [name] is a 47 y.o. female presenting on 04/07/2023 for Hypertension . Hypertension: being seen by the hypertension clinic, today 118/80. She is being managed and followed by both her cardiologist as well as hypertension clinic.  Now taking olmesartan 40 mg once daily and labetolol 300 mg twice daily. She is also experiencing orthostatic hypertension, if she stands up too quickly she is dizzy. She does state cardiology aware, she just works on rising slowly but she states it really makes her very dizzy.   Dr. Leonides Schanz, seeing them for MGUS, last visit 01/11/23, bone study negative for lytic lesions. Lab work in place for 6 month f/u with lab prior   Rheumatology: seeing Dr. Corliss Skains, she is having ongoing muscle cramps as well as bil lower leg pain. Cramping has since improved with gabapentin cymbalta and muscle relaxers however still ongoing pain with the legs. CK has been elevated x 4 years. Per note from last rheum visit recommendations for quad MRI and or muscle biopsy if pain ongoing, which she states is become increasingly harder to deal with on a daily basis.  Lab Results  Component Value Date   CKTOTAL 203 (H) 04/07/2023   CKMBINDEX 2.2 08/12/2022   TROPONINI <0.03 08/09/2018     New complaints:  Bil leg pain, ongoing. Worsening as well.  She has seen rheumatologist about this previously, CK has been consistently elevated which they are monitoring. Read last note of rheumatology who suggested if no improvement would consider muscle biopsy.      Social history:  Relevant past medical, surgical, family and social history reviewed and updated as indicated. Interim medical history since our last visit reviewed.  Allergies and medications reviewed and updated.  DATA REVIEWED: CHART IN EPIC     ROS: Negative unless specifically indicated  above in HPI.    Current Outpatient Medications:    amLODipine (NORVASC) 5 MG tablet, Take 1 tablet (5 mg total) by mouth daily., Disp: 90 tablet, Rfl: 3   DULoxetine (CYMBALTA) 30 MG capsule, TAKE 1 CAPSULE BY MOUTH EVERY DAY, Disp: 90 capsule, Rfl: 2   EPINEPHrine 0.3 mg/0.3 mL IJ SOAJ injection, Inject 0.3 mg into the muscle as needed for anaphylaxis., Disp: 1 each, Rfl: 0   gabapentin (NEURONTIN) 300 MG capsule, Take 1 capsule (300 mg total) by mouth 3 (three) times daily., Disp: 90 capsule, Rfl: 11   hydrALAZINE (APRESOLINE) 25 MG tablet, TAKE 1 TABLET BY MOUTH THREE TIMES A DAY, Disp: 270 tablet, Rfl: 1   ibuprofen (ADVIL) 800 MG tablet, Take 800 mg by mouth every 8 (eight) hours as needed., Disp: , Rfl:    labetalol (NORMODYNE) 200 MG tablet, Take 1.5 tablets (300 mg total) by mouth 2 (two) times daily., Disp: 270 tablet, Rfl: 0   levonorgestrel (MIRENA) 20 MCG/24HR IUD, 1 each by Intrauterine route once., Disp: , Rfl:    olmesartan (BENICAR) 40 MG tablet, Take 1 tablet (40 mg total) by mouth daily., Disp: 30 tablet, Rfl: 3   scopolamine (TRANSDERM-SCOP) 1 MG/3DAYS, Place 1 patch (1.5 mg total) onto the skin every 3 (three) days., Disp: 10 patch, Rfl: 12   tiZANidine (ZANAFLEX) 4 MG tablet, TAKE 1/2 TO 1 TABLET BY MOUTH AT BEDTIME AS NEEDED FOR MUSCLE SPASM, Disp: 90 tablet, Rfl: 0   Vitamin D, Ergocalciferol, (DRISDOL) 1.25 MG (50000 UNIT) CAPS  capsule, Take 1 capsule (50,000 Units total) by mouth every 7 (seven) days., Disp: 7 capsule, Rfl: 0   zolpidem (AMBIEN CR) 6.25 MG CR tablet, Take 1 tablet (6.25 mg total) by mouth at bedtime as needed., Disp: 30 tablet, Rfl: 0      Objective:    BP 118/80 (BP Location: Left Arm, Patient Position: Sitting, Cuff Size: Large)   Pulse 74   Temp 97.9 F (36.6 C) (Temporal)   Ht 5\' 9"  (1.753 m)   Wt (!) 335 lb (152 kg)   SpO2 99%   BMI 49.47 kg/m   Wt Readings from Last 3 Encounters:  04/07/23 (!) 335 lb (152 kg)  01/27/23 (!) 340 lb 9.8  oz (154.5 kg)  01/11/23 (!) 336 lb 12.8 oz (152.8 kg)    Physical Exam Constitutional:      General: She is not in acute distress.    Appearance: Normal appearance. She is normal weight. She is not ill-appearing, toxic-appearing or diaphoretic.  HENT:     Head: Normocephalic.  Cardiovascular:     Rate and Rhythm: Normal rate.     Comments: Bil ankle edema  Pulmonary:     Effort: Pulmonary effort is normal.  Musculoskeletal:        General: Normal range of motion.  Neurological:     General: No focal deficit present.     Mental Status: She is alert and oriented to person, place, and time. Mental status is at baseline.     Comments: No lymphadenopathy on exam   Psychiatric:        Mood and Affect: Mood normal.        Behavior: Behavior normal.        Thought Content: Thought content normal.        Judgment: Judgment normal.           Assessment & Plan:  Pain in both lower legs Assessment & Plan: Tizanidine not as effective as prior, pain becoming worse affecting mobility at times.  Chronic elevated CK, will repeat today.  Continue with adequate oral hydration during the day as well as stretching exercises that would be helpful.  Scheduled f/u with rheumatology for further evalution, ddx myositis  Orders: -     Magnesium -     CK  Elevated CK -     CK  Elevated LDH Assessment & Plan: Chest xray ordered. Pt to continue f/u with hematology as scheduled as well for ongoing evaluation as symptoms do not appear to be improving  Orders: -     DG Chest 2 View; Future  Elevated TSH -     TSH -     T4, free  Sleep disorder  Encounter for immunization -     Flu vaccine trivalent PF, 6mos and older(Flulaval,Afluria,Fluarix,Fluzone)  Essential hypertension Assessment & Plan: Controlled.continue amlodipine 5 mg once daily as well as hydralazine tid       Return in about 1 year (around 04/06/2024) for f/u CPE.  Mort Sawyers, MSN, APRN, FNP-C [facility]

## 2023-04-07 NOTE — Patient Instructions (Signed)
  Call rheumatologist and also hematologist to see if they can get you in sooner due to progression of symptoms.    Regards,   Mort Sawyers FNP-C

## 2023-04-14 NOTE — Assessment & Plan Note (Signed)
Chest xray ordered. Pt to continue f/u with hematology as scheduled as well for ongoing evaluation as symptoms do not appear to be improving

## 2023-04-14 NOTE — Assessment & Plan Note (Signed)
Controlled.continue amlodipine 5 mg once daily as well as hydralazine tid

## 2023-04-14 NOTE — Addendum Note (Signed)
Addended by: Mort Sawyers on: 04/14/2023 07:32 AM   Modules accepted: Level of Service

## 2023-04-14 NOTE — Assessment & Plan Note (Signed)
Tizanidine not as effective as prior, pain becoming worse affecting mobility at times.  Chronic elevated CK, will repeat today.  Continue with adequate oral hydration during the day as well as stretching exercises that would be helpful.  Scheduled f/u with rheumatology for further evalution, ddx myositis

## 2023-04-23 ENCOUNTER — Other Ambulatory Visit (HOSPITAL_BASED_OUTPATIENT_CLINIC_OR_DEPARTMENT_OTHER): Payer: Self-pay | Admitting: Cardiovascular Disease

## 2023-04-26 ENCOUNTER — Other Ambulatory Visit: Payer: Self-pay | Admitting: Family

## 2023-04-26 DIAGNOSIS — M62838 Other muscle spasm: Secondary | ICD-10-CM

## 2023-04-27 MED ORDER — TIZANIDINE HCL 4 MG PO TABS
ORAL_TABLET | ORAL | 0 refills | Status: DC
Start: 2023-04-27 — End: 2023-07-21

## 2023-05-17 ENCOUNTER — Other Ambulatory Visit: Payer: Self-pay

## 2023-05-17 ENCOUNTER — Ambulatory Visit (HOSPITAL_BASED_OUTPATIENT_CLINIC_OR_DEPARTMENT_OTHER): Payer: BC Managed Care – PPO | Admitting: Cardiovascular Disease

## 2023-05-17 ENCOUNTER — Encounter (HOSPITAL_BASED_OUTPATIENT_CLINIC_OR_DEPARTMENT_OTHER): Payer: Self-pay | Admitting: Cardiovascular Disease

## 2023-05-17 ENCOUNTER — Other Ambulatory Visit (HOSPITAL_BASED_OUTPATIENT_CLINIC_OR_DEPARTMENT_OTHER): Payer: Self-pay

## 2023-05-17 VITALS — BP 133/90 | HR 66 | Ht 69.0 in | Wt 335.4 lb

## 2023-05-17 DIAGNOSIS — I10 Essential (primary) hypertension: Secondary | ICD-10-CM

## 2023-05-17 MED ORDER — SEMAGLUTIDE-WEIGHT MANAGEMENT 0.25 MG/0.5ML ~~LOC~~ SOAJ
0.2500 mg | SUBCUTANEOUS | 0 refills | Status: AC
Start: 2023-05-17 — End: 2023-06-16
  Filled 2023-05-17: qty 2, 28d supply, fill #0

## 2023-05-17 MED ORDER — AMLODIPINE BESYLATE 10 MG PO TABS
10.0000 mg | ORAL_TABLET | Freq: Every day | ORAL | 3 refills | Status: DC
  Filled 2023-05-17: qty 90, 90d supply, fill #0

## 2023-05-17 MED ORDER — SEMAGLUTIDE-WEIGHT MANAGEMENT 1 MG/0.5ML ~~LOC~~ SOAJ
1.0000 mg | SUBCUTANEOUS | 0 refills | Status: DC
  Filled 2023-05-17 – 2023-07-11 (×3): qty 2, 28d supply, fill #0

## 2023-05-17 MED ORDER — AMLODIPINE BESYLATE 10 MG PO TABS
10.0000 mg | ORAL_TABLET | Freq: Every evening | ORAL | 3 refills | Status: DC
  Filled 2023-05-17: qty 90, 90d supply, fill #0
  Filled 2024-03-04: qty 90, 90d supply, fill #1

## 2023-05-17 MED ORDER — SEMAGLUTIDE-WEIGHT MANAGEMENT 0.5 MG/0.5ML ~~LOC~~ SOAJ
0.5000 mg | SUBCUTANEOUS | 0 refills | Status: AC
  Filled 2023-05-17 – 2023-06-09 (×3): qty 2, 28d supply, fill #0

## 2023-05-17 NOTE — Progress Notes (Signed)
[facility]ension [facility] Follow-up:    Date:  05/22/2023   ID:  [name], [name] Dec 17, 1975, MRN [id number]  PCP:  Mort Sawyers, FNP  Cardiologist:  None  Nephrologist:  Referring MD: Mort Sawyers, FNP   CC: Hypertension  History of Present Illness:    [name] is a 47 y.o. female with a hx of hypertension, hyperlipidemia, IgG lambda monoclonal gammopathy, OSA, and obesity, here for follow-up. She was initially seen 12/21/2022 to establish care in the [facility]. She has been followed by her cardiologist Dr. Odis Hollingshead since 08/2022. She had been diagnosed with hypertension at age 14-41. She has intolerance of multiple antihypertensives due to generalized muscle cramps. Workup for secondary hypertension showed normal aldosterone and renin levels. Cortisol was low, TSH and calcium levels were normal. Metanephrines were within normal limits. She was last seen by Dr. Odis Hollingshead 12/08/2022 and her blood pressure was 170/105 in the clinic. At that visit, she was not taking any antihypertensives as she stated they all cause myalgias. She was agreeable to restart labetalol and hydralazine at her prior dose. They also started her on losartan 50 mg daily. She was also waiting for her CPAP. She was referred to the [facility].  At her visit 11/2022, she confirmed struggling with hypertension for about 5 years. Her home readings were averaging 150s/100s on losartan, hydralazine, and labetalol. She complained of generalized muscle cramps that were exacerbated since taking antihypertensives. She has seen a rheumatologist, immunologist, and neurologist. She was prescribed gabapentin as needed and Cymbalta, was taking both at night with subsequent improvement in both severity and frequency of her muscle cramps. She also had random chest pressure that usually occurred while lying in bed. She noted headaches attributable to high blood pressures, and occasional  dizziness upon standing. Encouraged increased hydration to minimize dizziness. We added back amlodipine 5 mg daily. Renal artery dopplers were ordered but not completed. She was seen by our pharmacist 12/2022 and her home diastolic BP had been averaging more than 100 mmHg both morning and night. Losartan was stopped and she was started on olmesartan 40 mg daily. Labetalol was changed from 200 mg TID, to 300 mg BID. She saw her PCP 04/2023 and reported orthostatic symptoms.  Today, she reports feeling better overall. She has been trying to stay active, taking stairs instead of the elevator. Her activity continues to be limited by her knee. She needs a knee replacement but this is pending better control of her blood pressure. In the office her blood pressure is 133/90. At home she is seeing mostly 130s-140s/90s since she started the olmesartan. At 8 AM she will take amlodipine, hydralazine, labetalol, olmesartan, cymbalta; around 3-4 PM she takes a dose of hydralazine; at 10 PM she takes labetalol and hydralazine. She confirms that recently she is experiencing severe dizziness and near syncope when standing up. This has been occurring at least 3-4 times per week when she gets up from sitting at her desk when working. She usually needs to stand still and brace herself for a brief time. Continues to work on dietary changes. She notes that she does not always make the best meal choices when she is busy during the day. She has struggled with weight loss. She denies any palpitations, chest pain, shortness of breath, peripheral edema, headaches, orthopnea, or PND.  Previous antihypertensives: HCTZ Metoprolol Amlodipine Spironolactone Hydralazine Losartan  Past Medical History:  Diagnosis Date   Hypertension    Murmur  Past Surgical History:  Procedure Laterality Date   ABDOMINOPLASTY     ACHILLES TENDON SURGERY  06/08/2012   Procedure: ACHILLES TENDON REPAIR;  Surgeon: Nadara Mustard, MD;  Location: [facility]  OR;  Service: Orthopedics;  Laterality: Right;  Right Achilles Reconstruction   APPENDECTOMY     CHOLECYSTECTOMY     HYSTEROSCOPY WITH D & C N/A 03/06/2014   Procedure: DILATATION AND CURETTAGE With IUD Removal;  Surgeon: Serita Kyle, MD;  Location: [facility] ORS;  Service: Gynecology;  Laterality: N/A;   KNEE SURGERY     right knee arthroscropic    Current Medications: Current Meds  Medication Sig   DULoxetine (CYMBALTA) 30 MG capsule TAKE 1 CAPSULE BY MOUTH EVERY DAY   EPINEPHrine 0.3 mg/0.3 mL IJ SOAJ injection Inject 0.3 mg into the muscle as needed for anaphylaxis.   gabapentin (NEURONTIN) 300 MG capsule Take 1 capsule (300 mg total) by mouth 3 (three) times daily.   hydrALAZINE (APRESOLINE) 25 MG tablet TAKE 1 TABLET BY MOUTH THREE TIMES A DAY   ibuprofen (ADVIL) 800 MG tablet Take 800 mg by mouth every 8 (eight) hours as needed.   levonorgestrel (MIRENA) 20 MCG/24HR IUD 1 each by Intrauterine route once.   Semaglutide-Weight Management 0.25 MG/0.5ML SOAJ Inject 0.25 mg into the skin once a week for 28 days.   [START ON 06/15/2023] Semaglutide-Weight Management 0.5 MG/0.5ML SOAJ Inject 0.5 mg into the skin once a week for 28 days.   [START ON 07/14/2023] Semaglutide-Weight Management 1 MG/0.5ML SOAJ Inject 1 mg into the skin once a week for 28 days.   tiZANidine (ZANAFLEX) 4 MG tablet TAKE 1/2 TO 1 TABLET BY MOUTH AT BEDTIME AS NEEDED FOR MUSCLE SPASM   zolpidem (AMBIEN CR) 6.25 MG CR tablet Take 1 tablet (6.25 mg total) by mouth at bedtime as needed.   [DISCONTINUED] amLODipine (NORVASC) 5 MG tablet Take 1 tablet (5 mg total) by mouth daily.     Allergies:   Bee venom   Social History   Socioeconomic History   Marital status: Married    Spouse name: Renae Fickle   Number of children: 3   Years of education: Nursing school   Highest education level: Not on file  Occupational History   Occupation: Teacher, adult education: [employer]  Tobacco Use   Smoking status: Never    Passive exposure:  Never   Smokeless tobacco: Never  Vaping Use   Vaping status: Never Used  Substance and Sexual Activity   Alcohol use: Yes    Comment: once a month, 1-2 servings   Drug use: No   Sexual activity: Yes    Birth control/protection: I.U.D.  Other Topics Concern   Not on file  Social History Narrative      3 kids - ages 2 - 38   Husband - Renae Fickle   Enjoys: watching her kids play sports, spending time with husband   Exercise: not regular now - hoping to get back to it   Diet: not get currently, but hoping to do better   Social Determinants of Health   Financial Resource Strain: Low Risk  (08/09/2018)   Overall Financial Resource Strain (CARDIA)    Difficulty of Paying Living Expenses: Not hard at all  Food Insecurity: No Food Insecurity (12/21/2022)   Hunger Vital Sign    Worried About Running Out of Food in the Last Year: Never true    Ran Out of Food in the Last Year: Never true  Transportation Needs:  No Transportation Needs (12/21/2022)   PRAPARE - Administrator, Civil Service (Medical): No    Lack of Transportation (Non-Medical): No  Physical Activity: Inactive (12/21/2022)   Exercise Vital Sign    Days of Exercise per Week: 0 days    Minutes of Exercise per Session: 0 min  Stress: Not on file  Social Connections: Not on file     Family History: The patient's family history includes Autism in her sister; Diabetes in her father and mother; Healthy in her daughter, daughter, and son; Heart disease in her mother; Hypertension in her father, mother, and sister.  ROS:   Please see the history of present illness.    (+) Dizziness (+) Near syncope (+) Knee pain All other systems reviewed and are negative.  EKGs/Labs/Other Studies Reviewed:    Chest X-Ray  04/07/2023: FINDINGS: Normal sized heart. Clear lungs with normal vascularity. Thoracic spine degenerative changes.   IMPRESSION: No active disease.  Echo  08/31/2022: Sonographer Comments: Patient is obese.  Image acquisition challenging due  to patient body habitus. Global longitudinal strain was attempted.   IMPRESSIONS   1. 2D Longitudinal Strain2D Strain GLS [facility]): 18.5 % 2D Strain GLS (A3C):  15.9 % 2D Strain GLS (A4C): 11.6 % 2D Strain GLS Avg:15.3 %      . Left ventricular ejection fraction, by estimation, is 55 to 60%.  Left ventricular ejection fraction by PLAX is 59 %. The left ventricle has  normal function. The left ventricle has no regional wall motion  abnormalities. There is mild concentric left   ventricular hypertrophy. Left ventricular diastolic parameters are  consistent with Grade II diastolic dysfunction (pseudonormalization).  Elevated left atrial pressure. The E/e' is 14.1 Medial.   2. Right ventricular systolic function is normal. The right ventricular  size is normal. There is normal pulmonary artery systolic pressure.   3. Left atrial size was mildly dilated.   4. The mitral valve is normal in structure. Trivial mitral valve  regurgitation.   5. The aortic valve is normal in structure. Aortic valve regurgitation is  not visualized. No aortic stenosis is present.   6. The inferior vena cava is normal in size with greater than 50%  respiratory variability, suggesting right atrial pressure of 3 mmHg.   EKG:  EKG is personally reviewed. 05/17/2023:  Not ordered. 12/21/2022: EKG was not ordered. 08/12/2022 (Dr. Odis Hollingshead): Sinus rhythm, 75 bpm, LAE, nonspecific T wave abnormality.   Recent Labs: 01/04/2023: ALT 54; BUN 11; Creatinine 1.01; Hemoglobin 13.8; Platelet Count 277; Potassium 4.2; Sodium 137 04/07/2023: Magnesium 2.0; TSH 3.44   Recent Lipid Panel    Component Value Date/Time   CHOL 202 (H) 08/12/2022 1022   TRIG 135 08/12/2022 1022   HDL 41 08/12/2022 1022   CHOLHDL 5 10/08/2021 1025   VLDL 24.4 10/08/2021 1025   LDLCALC 137 (H) 08/12/2022 1022   LDLDIRECT 144 (H) 08/12/2022 1022    Physical Exam:    VS:  BP (!) 133/90 (BP Location: Left Arm, Patient  Position: Sitting, Cuff Size: Large)   Pulse 66   Ht 5\' 9"  (1.753 m)   Wt (!) 335 lb 6.4 oz (152.1 kg)   SpO2 97%   BMI 49.53 kg/m  , BMI Body mass index is 49.53 kg/m. GENERAL:  Well appearing HEENT: Pupils equal round and reactive, fundi not visualized, oral mucosa unremarkable NECK:  No jugular venous distention, waveform within normal limits, carotid upstroke brisk and symmetric, no bruits, no thyromegaly LUNGS:  Clear to auscultation bilaterally HEART:  RRR.  PMI not displaced or sustained,S1 and S2 within normal limits, no S3, no S4, no clicks, no rubs, no murmurs ABD:  Soft, non-tender EXT:  2 plus pulses throughout, no edema, no cyanosis, no clubbing SKIN:  No rashes, no nodules NEURO:  Cranial nerves II through XII grossly intact, motor grossly intact throughout PSYCH:  Cognitively intact, oriented to person place and time   ASSESSMENT/PLAN:    # Hypertension Improved control with current regimen (amlodipine, hydralazine, labetalol, olmesartan). However, experiencing orthostatic symptoms, likely due to medication timing. Blood pressure readings in the 130s-140s/90s. -Switch amlodipine to PM dosing. -Continue monitoring blood pressure at home. -Adjust amlodipine dose to 10mg .  # Orthostatic Hypotension Experiencing dizziness upon standing, likely due to antihypertensive medications. -Adjust timing of amlodipine to PM to potentially alleviate symptoms.  # Obesity Difficulty losing weight despite attempts at diet modification and exercise. Previous unsuccessful attempts at weight management programs. -Initiate Wegovy (semaglutide) injection, starting with 0.25mg  once weekly for 4 weeks, then increasing as tolerated. -Encourage continued efforts with diet modification and exercise. -Consider referral to weight management program.  # Hyperlipidemia Elevated cholesterol levels, but 10-year risk of heart attack or stroke is low (5.1%). -We will get a calcium score to assess  for presence of plaque in heart arteries and aorta, which may influence decision to initiate cholesterol-lowering medication.  # Knee Surgery Will clear for knee surgery once blood pressure is controlled.  Follow-up in 1-2 months to assess blood pressure control, response to [facility], and overall progress.        Screening for Secondary Hypertension:     12/21/2022    2:35 PM  Causes  Drugs/Herbals Screened     - Comments + salt, no caffeine/ Rare EtOH.  Sleep Apnea Screened  Thyroid Disease Screened  Hyperaldosteronism Screened  Pheochromocytoma Screened  Cushing's Syndrome Screened  Hyperparathyroidism Screened  Coarctation of the Aorta Screened     - Comments R arm BP higher than the L  Compliance Screened    Relevant Labs/Studies:    Latest Ref Rng & Units 01/04/2023    3:01 PM 11/29/2022   10:38 AM 08/12/2022   10:22 AM  Basic Labs  Sodium 135 - 145 mmol/L 137  141  138   Potassium 3.5 - 5.1 mmol/L 4.2  4.8  4.7   Creatinine 0.44 - 1.00 mg/dL 4.54  0.98  1.19        Latest Ref Rng & Units 04/07/2023   11:18 AM 11/29/2022   10:38 AM  Thyroid   TSH 0.35 - 5.50 uIU/mL 3.44  3.410        Latest Ref Rng & Units 08/12/2022   10:22 AM  Renin/Aldosterone   Aldosterone 0.0 - 30.0 ng/dL 14.7   Aldos/Renin Ratio 0.0 - 30.0 10.6        Latest Ref Rng & Units 08/12/2022   10:22 AM  Metanephrines/Catecholamines   Metanephrines 0.0 - 88.0 pg/mL 42.0   Normetanephrines  0.0 - 218.9 pg/mL 201.1           12/21/2022    2:54 PM  Renovascular   Renal Artery [place] Completed Yes     Disposition:    FU with Advanced HTN Clinic in 2 months.  Medication Adjustments/Labs and Tests Ordered: Current medicines are reviewed at length with the patient today.  Concerns regarding medicines are outlined above.   Orders Placed This Encounter  Procedures   CT CARDIAC SCORING (SELF PAY ONLY)  Meds ordered this encounter  Medications   Semaglutide-Weight Management 0.25 MG/0.5ML SOAJ     Sig: Inject 0.25 mg into the skin once a week for 28 days.    Dispense:  2 mL    Refill:  0   Semaglutide-Weight Management 0.5 MG/0.5ML SOAJ    Sig: Inject 0.5 mg into the skin once a week for 28 days.    Dispense:  2 mL    Refill:  0   Semaglutide-Weight Management 1 MG/0.5ML SOAJ    Sig: Inject 1 mg into the skin once a week for 28 days.    Dispense:  2 mL    Refill:  0   DISCONTD: amLODipine (NORVASC) 10 MG tablet    Sig: Take 1 tablet (10 mg total) by mouth daily.    Dispense:  90 tablet    Refill:  3    New dose, d/c 5 mg rx   amLODipine (NORVASC) 10 MG tablet    Sig: Take 1 tablet (10 mg total) by mouth every evening.    Dispense:  90 tablet    Refill:  3    D/C PREVIOUS RX   I,Mathew Stumpf,acting as a scribe for Chilton Si, MD.,have documented all relevant documentation on the behalf of Chilton Si, MD,as directed by  Chilton Si, MD while in the presence of Chilton Si, MD.  I, Hykeem Ojeda C. Duke Salvia, MD have reviewed all documentation for this visit.  The documentation of the exam, diagnosis, procedures, and orders on 05/22/2023 are all accurate and complete.  Signed, Chilton Si, MD  05/22/2023 2:38 PM    [facility]

## 2023-05-17 NOTE — Patient Instructions (Addendum)
Medication Instructions:  TAKE AMLODIPINE 10 MG EVERY EVENING   START WEGOVY  INJECT 0.25 MG WEEKLY FOR 4 WEEKS  INCREASE TO 0.5 MG WEEKLY FOR 4 WEEKS,  INCREASE TO 1 MG WEEKLY FROM THEN ON    Labwork: NONE   Testing/Procedures: CALCIUM SCORE - THIS WILL COST $99 OUT OF POCKET    Follow-Up: 2 MONTHS IN ADV HTN    Special Instructions:  CONTINUE TO MONITOR YOUR BLOOD PRESSURE DAILY. BRING READINGS AND MACHINE TO FOLLOW UP   DASH Eating Plan DASH stands for "Dietary Approaches to Stop Hypertension." The DASH eating plan is a healthy eating plan that has been shown to reduce high blood pressure (hypertension). It may also reduce your risk for type 2 diabetes, heart disease, and stroke. The DASH eating plan may also help with weight loss. What are tips for following this plan?  General guidelines Avoid eating more than 2,300 mg (milligrams) of salt (sodium) a day. If you have hypertension, you may need to reduce your sodium intake to 1,500 mg a day. Limit alcohol intake to no more than 1 drink a day for nonpregnant women and 2 drinks a day for men. One drink equals 12 oz of beer, 5 oz of wine, or 1 oz of hard liquor. Work with your health care provider to maintain a healthy body weight or to lose weight. Ask what an ideal weight is for you. Get at least 30 minutes of exercise that causes your heart to beat faster (aerobic exercise) most days of the week. Activities may include walking, swimming, or biking. Work with your health care provider or diet and nutrition specialist (dietitian) to adjust your eating plan to your individual calorie needs. Reading food labels  Check food labels for the amount of sodium per serving. Choose foods with less than 5 percent of the Daily Value of sodium. Generally, foods with less than 300 mg of sodium per serving fit into this eating plan. To find whole grains, look for the word "whole" as the first word in the ingredient list. Shopping Buy products  labeled as "low-sodium" or "no salt added." Buy fresh foods. Avoid canned foods and premade or frozen meals. Cooking Avoid adding salt when cooking. Use salt-free seasonings or herbs instead of table salt or sea salt. Check with your health care provider or pharmacist before using salt substitutes. Do not fry foods. Cook foods using healthy methods such as baking, boiling, grilling, and broiling instead. Cook with heart-healthy oils, such as olive, canola, soybean, or sunflower oil. Meal planning Eat a balanced diet that includes: 5 or more servings of fruits and vegetables each day. At each meal, try to fill half of your plate with fruits and vegetables. Up to 6-8 servings of whole grains each day. Less than 6 oz of lean meat, poultry, or fish each day. A 3-oz serving of meat is about the same size as a deck of cards. One egg equals 1 oz. 2 servings of low-fat dairy each day. A serving of nuts, seeds, or beans 5 times each week. Heart-healthy fats. Healthy fats called Omega-3 fatty acids are found in foods such as flaxseeds and coldwater fish, like sardines, salmon, and mackerel. Limit how much you eat of the following: Canned or prepackaged foods. Food that is high in trans fat, such as fried foods. Food that is high in saturated fat, such as fatty meat. Sweets, desserts, sugary drinks, and other foods with added sugar. Full-fat dairy products. Do not salt foods before  eating. Try to eat at least 2 vegetarian meals each week. Eat more home-cooked food and less restaurant, buffet, and fast food. When eating at a restaurant, ask that your food be prepared with less salt or no salt, if possible. What foods are recommended? The items listed may not be a complete list. Talk with your dietitian about what dietary choices are best for you. Grains Whole-grain or whole-wheat bread. Whole-grain or whole-wheat pasta. Brown rice. Orpah Cobb. Bulgur. Whole-grain and low-sodium cereals. Pita  bread. Low-fat, low-sodium crackers. Whole-wheat flour tortillas. Vegetables Fresh or frozen vegetables (raw, steamed, roasted, or grilled). Low-sodium or reduced-sodium tomato and vegetable juice. Low-sodium or reduced-sodium tomato sauce and tomato paste. Low-sodium or reduced-sodium canned vegetables. Fruits All fresh, dried, or frozen fruit. Canned fruit in natural juice (without added sugar). Meat and other protein foods Skinless chicken or [place]. Ground chicken or [place]. Pork with fat trimmed off. Fish and seafood. Egg whites. Dried beans, peas, or lentils. Unsalted nuts, nut butters, and seeds. Unsalted canned beans. Lean cuts of beef with fat trimmed off. Low-sodium, lean deli meat. Dairy Low-fat (1%) or fat-free (skim) milk. Fat-free, low-fat, or reduced-fat cheeses. Nonfat, low-sodium ricotta or cottage cheese. Low-fat or nonfat yogurt. Low-fat, low-sodium cheese. Fats and oils Soft margarine without trans fats. Vegetable oil. Low-fat, reduced-fat, or light mayonnaise and salad dressings (reduced-sodium). Canola, safflower, olive, soybean, and sunflower oils. Avocado. Seasoning and other foods Herbs. Spices. Seasoning mixes without salt. Unsalted popcorn and pretzels. Fat-free sweets. What foods are not recommended? The items listed may not be a complete list. Talk with your dietitian about what dietary choices are best for you. Grains Baked goods made with fat, such as croissants, muffins, or some breads. Dry pasta or rice meal packs. Vegetables Creamed or fried vegetables. Vegetables in a cheese sauce. Regular canned vegetables (not low-sodium or reduced-sodium). Regular canned tomato sauce and paste (not low-sodium or reduced-sodium). Regular tomato and vegetable juice (not low-sodium or reduced-sodium). Rosita Fire. Olives. Fruits Canned fruit in a light or heavy syrup. Fried fruit. Fruit in cream or butter sauce. Meat and other protein foods Fatty cuts of meat. Ribs. Fried meat.  Tomasa Blase. Sausage. Bologna and other processed lunch meats. Salami. Fatback. Hotdogs. Bratwurst. Salted nuts and seeds. Canned beans with added salt. Canned or smoked fish. Whole eggs or egg yolks. Chicken or [place] with skin. Dairy Whole or 2% milk, cream, and half-and-half. Whole or full-fat cream cheese. Whole-fat or sweetened yogurt. Full-fat cheese. Nondairy creamers. Whipped toppings. Processed cheese and cheese spreads. Fats and oils Butter. Stick margarine. Lard. Shortening. Ghee. Bacon fat. Tropical oils, such as coconut, palm kernel, or palm oil. Seasoning and other foods Salted popcorn and pretzels. Onion salt, garlic salt, seasoned salt, table salt, and sea salt. Worcestershire sauce. Tartar sauce. Barbecue sauce. Teriyaki sauce. Soy sauce, including reduced-sodium. Steak sauce. Canned and packaged gravies. Fish sauce. Oyster sauce. Cocktail sauce. Horseradish that you find on the shelf. Ketchup. Mustard. Meat flavorings and tenderizers. Bouillon cubes. Hot sauce and Tabasco sauce. Premade or packaged marinades. Premade or packaged taco seasonings. Relishes. Regular salad dressings. Where to find more information: National Heart, Lung, and Blood Institute: PopSteam.is American Heart Association: www.heart.org Summary The DASH eating plan is a healthy eating plan that has been shown to reduce high blood pressure (hypertension). It may also reduce your risk for type 2 diabetes, heart disease, and stroke. With the DASH eating plan, you should limit salt (sodium) intake to 2,300 mg a day. If you have hypertension, you may  need to reduce your sodium intake to 1,500 mg a day. When on the DASH eating plan, aim to eat more fresh fruits and vegetables, whole grains, lean proteins, low-fat dairy, and heart-healthy fats. Work with your health care provider or diet and nutrition specialist (dietitian) to adjust your eating plan to your individual calorie needs. This information is not intended to  replace advice given to you by your health care provider. Make sure you discuss any questions you have with your health care provider. Document Released: 07/07/2011 Document Revised: 06/30/2017 Document Reviewed: 07/11/2016 Elsevier Patient Education  2020 [employer].

## 2023-05-19 ENCOUNTER — Other Ambulatory Visit (HOSPITAL_BASED_OUTPATIENT_CLINIC_OR_DEPARTMENT_OTHER): Payer: Self-pay

## 2023-05-22 ENCOUNTER — Encounter (HOSPITAL_BASED_OUTPATIENT_CLINIC_OR_DEPARTMENT_OTHER): Payer: Self-pay | Admitting: Cardiovascular Disease

## 2023-05-31 ENCOUNTER — Ambulatory Visit (HOSPITAL_BASED_OUTPATIENT_CLINIC_OR_DEPARTMENT_OTHER)
Admission: RE | Admit: 2023-05-31 | Discharge: 2023-05-31 | Disposition: A | Discharge status: Home / Self Care | Payer: BC Managed Care – PPO | Source: Ambulatory Visit | Attending: Cardiovascular Disease | Admitting: Cardiovascular Disease

## 2023-05-31 DIAGNOSIS — I10 Essential (primary) hypertension: Secondary | ICD-10-CM | POA: Insufficient documentation

## 2023-06-08 NOTE — Progress Notes (Signed)
noted 

## 2023-06-09 ENCOUNTER — Other Ambulatory Visit (HOSPITAL_BASED_OUTPATIENT_CLINIC_OR_DEPARTMENT_OTHER): Payer: Self-pay

## 2023-06-09 ENCOUNTER — Other Ambulatory Visit: Payer: Self-pay

## 2023-06-11 ENCOUNTER — Other Ambulatory Visit (HOSPITAL_BASED_OUTPATIENT_CLINIC_OR_DEPARTMENT_OTHER): Payer: Self-pay

## 2023-06-12 ENCOUNTER — Other Ambulatory Visit: Payer: Self-pay

## 2023-06-15 ENCOUNTER — Other Ambulatory Visit: Payer: Self-pay

## 2023-06-22 ENCOUNTER — Other Ambulatory Visit: Payer: Self-pay

## 2023-07-11 ENCOUNTER — Other Ambulatory Visit: Payer: Self-pay | Admitting: *Deleted

## 2023-07-11 ENCOUNTER — Other Ambulatory Visit (HOSPITAL_BASED_OUTPATIENT_CLINIC_OR_DEPARTMENT_OTHER): Payer: Self-pay

## 2023-07-11 DIAGNOSIS — D472 Monoclonal gammopathy: Secondary | ICD-10-CM

## 2023-07-12 ENCOUNTER — Inpatient Hospital Stay: Payer: BC Managed Care – PPO | Attending: Hematology and Oncology

## 2023-07-12 DIAGNOSIS — D472 Monoclonal gammopathy: Secondary | ICD-10-CM | POA: Insufficient documentation

## 2023-07-14 ENCOUNTER — Other Ambulatory Visit (HOSPITAL_BASED_OUTPATIENT_CLINIC_OR_DEPARTMENT_OTHER): Payer: Self-pay

## 2023-07-19 ENCOUNTER — Inpatient Hospital Stay: Payer: BC Managed Care – PPO | Admitting: Hematology and Oncology

## 2023-07-19 ENCOUNTER — Inpatient Hospital Stay: Payer: BC Managed Care – PPO

## 2023-07-19 VITALS — BP 160/110 | HR 78 | Temp 97.9°F | Resp 17 | Ht 69.0 in | Wt 324.5 lb

## 2023-07-19 DIAGNOSIS — D472 Monoclonal gammopathy: Secondary | ICD-10-CM | POA: Diagnosis not present

## 2023-07-19 LAB — CBC WITH DIFFERENTIAL (CANCER CENTER ONLY)
Abs Immature Granulocytes: 0.01 10*3/uL (ref 0.00–0.07)
Basophils Absolute: 0 10*3/uL (ref 0.0–0.1)
Basophils Relative: 1 %
Eosinophils Absolute: 0.1 10*3/uL (ref 0.0–0.5)
Eosinophils Relative: 3 %
HCT: 39.5 % (ref 36.0–46.0)
Hemoglobin: 13 g/dL (ref 12.0–15.0)
Immature Granulocytes: 0 %
Lymphocytes Relative: 31 %
Lymphs Abs: 1.5 10*3/uL (ref 0.7–4.0)
MCH: 27.5 pg (ref 26.0–34.0)
MCHC: 32.9 g/dL (ref 30.0–36.0)
MCV: 83.7 fL (ref 80.0–100.0)
Monocytes Absolute: 0.4 10*3/uL (ref 0.1–1.0)
Monocytes Relative: 8 %
Neutro Abs: 2.8 10*3/uL (ref 1.7–7.7)
Neutrophils Relative %: 57 %
Platelet Count: 320 10*3/uL (ref 150–400)
RBC: 4.72 MIL/uL (ref 3.87–5.11)
RDW: 13.8 % (ref 11.5–15.5)
WBC Count: 4.9 10*3/uL (ref 4.0–10.5)
nRBC: 0 % (ref 0.0–0.2)

## 2023-07-19 LAB — CMP (CANCER CENTER ONLY)
ALT: 59 U/L — ABNORMAL HIGH (ref 0–44)
AST: 46 U/L — ABNORMAL HIGH (ref 15–41)
Albumin: 4.4 g/dL (ref 3.5–5.0)
Alkaline Phosphatase: 47 U/L (ref 38–126)
Anion gap: 6 (ref 5–15)
BUN: 7 mg/dL (ref 6–20)
CO2: 24 mmol/L (ref 22–32)
Calcium: 9.4 mg/dL (ref 8.9–10.3)
Chloride: 105 mmol/L (ref 98–111)
Creatinine: 0.94 mg/dL (ref 0.44–1.00)
GFR, Estimated: >60 mL/min (ref >60)
Glucose, Bld: 93 mg/dL (ref 70–99)
Potassium: 4.2 mmol/L (ref 3.5–5.1)
Sodium: 135 mmol/L (ref 135–145)
Total Bilirubin: 0.8 mg/dL (ref <1.2)
Total Protein: 7.1 g/dL (ref 6.5–8.1)

## 2023-07-19 LAB — LACTATE DEHYDROGENASE: LDH: 184 U/L (ref 98–192)

## 2023-07-19 NOTE — Progress Notes (Signed)
[facility] [phone]   [phone]  PROGRESS NOTE  Patient Care Team: Mort Sawyers, FNP as PCP - General (Family Medicine)  Hematological/Oncological History # IgG Lambda Monoclonal Gammopathy  05/24/2022: SPEP showed M protein 0.4 with IgG lambda specificity on IFE 06/27/2022: establish care with Dr. Leonides Schanz   Interval History:  [name] 47 y.o. female with medical history significant for IgG lambda MGUS who presents for a follow up visit. The patient's last visit was on 01/11/2023. In the interim since the last visit she has had no major changes in her health.  On exam today Ms. Stetser reports she is still having some occasional leg pain and pain in her lower back.  She reports he is also having quite a bit of fatigue and that today she slept until 1 PM after staying up until midnight.  She reports she is currently on Cymbalta and gabapentin 3 times a day for her pain.  She reports that she is also on Zanaflex.  She notes that she has had no issues with urinary changes such as dark urine or bubbling or foaming of the urine.  She reports her appetite is quite good.  She is currently using Wegovy to try to lose weight and is already down to 324 pounds from 335 pounds in September.  She reports that she is not having any recent infectious symptoms such as runny nose, sore throat, or cough.  She also reports that she did not take her blood pressure medications this morning which were explain her elevated BP.  She notes no fevers, chills, sweats, nausea, vomiting or diarrhea.  A full 10 point ROS is otherwise negative.  MEDICAL HISTORY:  Past Medical History:  Diagnosis Date   Hypertension    Murmur     SURGICAL HISTORY: Past Surgical History:  Procedure Laterality Date   ABDOMINOPLASTY     ACHILLES TENDON SURGERY  06/08/2012   Procedure: ACHILLES TENDON REPAIR;  Surgeon: Nadara Mustard, MD;  Location: [facility] OR;  Service: Orthopedics;   Laterality: Right;  Right Achilles Reconstruction   APPENDECTOMY     CHOLECYSTECTOMY     HYSTEROSCOPY WITH D & C N/A 03/06/2014   Procedure: DILATATION AND CURETTAGE With IUD Removal;  Surgeon: Serita Kyle, MD;  Location: [facility];  Service: Gynecology;  Laterality: N/A;   KNEE SURGERY     right knee arthroscropic    SOCIAL HISTORY: Social History   Socioeconomic History   Marital status: Married    Spouse name: Renae Fickle   Number of children: 3   Years of education: Nursing school   Highest education level: Not on file  Occupational History   Occupation: Teacher, adult education: [employer]  Tobacco Use   Smoking status: Never    Passive exposure: Never   Smokeless tobacco: Never  Vaping Use   Vaping status: Never Used  Substance and Sexual Activity   Alcohol use: Yes    Comment: once a month, 1-2 servings   Drug use: No   Sexual activity: Yes    Birth control/protection: I.U.D.  Other Topics Concern   Not on file  Social History Narrative      3 kids - ages 4 - 79   Husband - Renae Fickle   Enjoys: watching her kids play sports, spending time with husband   Exercise: not regular now - hoping to get back to it   Diet: not get currently, but hoping to do better   Social  Drivers of Health   Financial Resource Strain: Low Risk  (08/09/2018)   Overall Financial Resource Strain (CARDIA)    Difficulty of Paying Living Expenses: Not hard at all  Food Insecurity: No Food Insecurity (12/21/2022)   Hunger Vital Sign    Worried About Running Out of Food in the Last Year: Never true    Ran Out of Food in the Last Year: Never true  Transportation Needs: No Transportation Needs (12/21/2022)   PRAPARE - Administrator, Civil Service (Medical): No    Lack of Transportation (Non-Medical): No  Physical Activity: Inactive (12/21/2022)   Exercise Vital Sign    Days of Exercise per Week: 0 days    Minutes of Exercise per Session: 0 min  Stress: Not on file  Social Connections: Not on file   Intimate Partner Violence: Not on file    FAMILY HISTORY: Family History  Problem Relation Age of Onset   Heart disease Mother    Diabetes Mother    Hypertension Mother    Diabetes Father    Hypertension Father    Hypertension Sister    Autism Sister    Healthy Daughter    Healthy Daughter    Healthy Son     ALLERGIES:  is allergic to bee venom.  MEDICATIONS:  Current Outpatient Medications  Medication Sig Dispense Refill   amLODipine (NORVASC) 10 MG tablet Take 1 tablet (10 mg total) by mouth every evening. 90 tablet 3   DULoxetine (CYMBALTA) 30 MG capsule TAKE 1 CAPSULE BY MOUTH EVERY DAY 90 capsule 2   EPINEPHrine 0.3 mg/0.3 mL IJ SOAJ injection Inject 0.3 mg into the muscle as needed for anaphylaxis. 1 each 0   gabapentin (NEURONTIN) 300 MG capsule Take 1 capsule (300 mg total) by mouth 3 (three) times daily. 90 capsule 11   hydrALAZINE (APRESOLINE) 25 MG tablet TAKE 1 TABLET BY MOUTH THREE TIMES A DAY 270 tablet 1   ibuprofen (ADVIL) 800 MG tablet Take 800 mg by mouth every 8 (eight) hours as needed.     labetalol (NORMODYNE) 200 MG tablet Take 1.5 tablets (300 mg total) by mouth 2 (two) times daily. 270 tablet 0   levonorgestrel (MIRENA) 20 MCG/24HR IUD 1 each by Intrauterine route once.     olmesartan (BENICAR) 40 MG tablet TAKE 1 TABLET BY MOUTH EVERY DAY (Patient not taking: Reported on 05/17/2023) 90 tablet 1   scopolamine (TRANSDERM-SCOP) 1 MG/3DAYS Place 1 patch (1.5 mg total) onto the skin every 3 (three) days. (Patient not taking: Reported on 05/17/2023) 10 patch 12   Semaglutide-Weight Management 1 MG/0.5ML SOAJ Inject 1 mg into the skin once a week for 28 days. 2 mL 0   tiZANidine (ZANAFLEX) 4 MG tablet TAKE 1/2 TO 1 TABLET BY MOUTH AT BEDTIME AS NEEDED FOR MUSCLE SPASM 90 tablet 0   Vitamin D, Ergocalciferol, (DRISDOL) 1.25 MG (50000 UNIT) CAPS capsule Take 1 capsule (50,000 Units total) by mouth every 7 (seven) days. (Patient not taking: Reported on 05/17/2023)  7 capsule 0   zolpidem (AMBIEN CR) 6.25 MG CR tablet Take 1 tablet (6.25 mg total) by mouth at bedtime as needed. 30 tablet 0   No current facility-administered medications for this visit.    REVIEW OF SYSTEMS:   Constitutional: ( - ) fevers, ( - )  chills , ( - ) night sweats Eyes: ( - ) blurriness of vision, ( - ) double vision, ( - ) watery eyes Ears, nose, mouth, throat, and  face: ( - ) mucositis, ( - ) sore throat Respiratory: ( - ) cough, ( - ) dyspnea, ( - ) wheezes Cardiovascular: ( - ) palpitation, ( - ) chest discomfort, ( - ) lower extremity swelling Gastrointestinal:  ( - ) nausea, ( - ) heartburn, ( - ) change in bowel habits Skin: ( - ) abnormal skin rashes Lymphatics: ( - ) new lymphadenopathy, ( - ) easy bruising Neurological: ( - ) numbness, ( - ) tingling, ( - ) new weaknesses Behavioral/Psych: ( - ) mood change, ( - ) new changes  All other systems were reviewed with the patient and are negative.  PHYSICAL EXAMINATION:   Vitals:   07/19/23 1436 07/19/23 1442  BP: (!) 150/111 (!) 160/110  Pulse: 78   Resp: 17   Temp: 97.9 F (36.6 C)   SpO2: 100%     Filed Weights   07/19/23 1436  Weight: (!) 324 lb 8 oz (147.2 kg)     GENERAL: Well-appearing middle-aged [place] female, alert, no distress and comfortable SKIN: skin color, texture, turgor are normal, no rashes or significant lesions EYES: conjunctiva are pink and non-injected, sclera clear LUNGS: clear to auscultation and percussion with normal breathing effort HEART: regular rate & rhythm and no murmurs and no lower extremity edema Musculoskeletal: no cyanosis of digits and no clubbing  PSYCH: alert & oriented x 3, fluent speech NEURO: no focal motor/sensory deficits  LABORATORY DATA:  I have reviewed the data as listed    Latest Ref Rng & Units 07/19/2023    2:31 PM 01/04/2023    3:01 PM 08/12/2022   10:22 AM  CBC  WBC 4.0 - 10.5 K/uL 4.9  4.9    Hemoglobin 12.0 - 15.0 g/dL 62.9  52.8   41.3   Hematocrit 36.0 - 46.0 % 39.5  43.0  42.6   Platelets 150 - 400 K/uL 320  277         Latest Ref Rng & Units 07/19/2023    2:31 PM 01/04/2023    3:01 PM 11/29/2022   10:38 AM  CMP  Glucose 70 - 99 mg/dL 93  95  96   BUN 6 - 20 mg/dL 7  11  10    Creatinine 0.44 - 1.00 mg/dL 2.44  0.10  2.72   Sodium 135 - 145 mmol/L 135  137  141   Potassium 3.5 - 5.1 mmol/L 4.2  4.2  4.8   Chloride 98 - 111 mmol/L 105  107  104   CO2 22 - 32 mmol/L 24  25  20    Calcium 8.9 - 10.3 mg/dL 9.4  9.1  9.3   Total Protein 6.5 - 8.1 g/dL 7.1  7.6  6.8   Total Bilirubin <1.2 mg/dL 0.8  0.8  0.5   Alkaline Phos 38 - 126 U/L 47  46  57   AST 15 - 41 U/L 46  33  25   ALT 0 - 44 U/L 59  54  42     Lab Results  Component Value Date   MPROTEIN 0.6 (H) 01/04/2023   MPROTEIN 0.5 (H) 06/27/2022   Lab Results  Component Value Date   KPAFRELGTCHN 9.7 07/19/2023   KPAFRELGTCHN 10.6 01/04/2023   KPAFRELGTCHN 13.4 06/27/2022   LAMBDASER 9.6 07/19/2023   LAMBDASER 7.9 01/04/2023   LAMBDASER 10.1 06/27/2022   KAPLAMBRATIO 1.01 07/19/2023   KAPLAMBRATIO 1.34 01/04/2023   KAPLAMBRATIO 1.33 06/27/2022    RADIOGRAPHIC STUDIES: No results found.  ASSESSMENT & PLAN [name] 47 y.o. female with medical history significant for IgG lambda MGUS who presents for a follow up visit.  After review of the labs, review of the records, and discussion with the patient the patients findings are most consistent with a monoclonal gammopathy.    Monoclonal Gammopathies are a group of medical conditions defined by the presence of a monoclonal protein (an M protein) in the blood or urine. Monoclonal gammopathies include monoclonal gammopathy of unknown significance (MGUS), Monoclonal gammopathies of renal or neurological significance,  smoldering multiple myeloma (SMM), multiple myeloma (MM), AL amyloidosis, and Waldenstrom macroglobulinemia. The goal of the initial workup is to determine which monoclonal  gammopathy a patient has. The workup consists of evaluating protein in the serum (with serum protein electrophoresis (SPEP) and serum free light chains) , evaluating protein in the urine (UPEP), and evaluation of the skeleton (DG Bone Met Survey) to assure no lytic lesions. Baseline bloodwork includes CMP and CBC. If no CRAB criteria or high risk criteria are noted then the diagnosis is MGUS. MGUS must be followed with bloodwork periodically to assure it does not convert to multiple myeloma (occurs to approximately 1% of patients per year). If there are CRAB criteria or high risk features (such as elevated serum free light chain ratio (taking into account renal function), a non IgG M protein, or M protein >1.5) then a bone marrow biopsy must be pursued.     #IgG Lambda Monoclonal Gammopathy of Undetermined Significance --at each visit will order SPEP, SFLC and CBC, CMP, and LDH --metastatic bone survey showed no evidence of lytic lesions --UPEP has not yet been performed, have requested that she pick up the jug today. --Labs last week showed white blood cell count 4.9, hemoglobin 13.0, MCV 83.7, platelets 320.  M protein was 0.6 with kappa 10.6, lambda 7.9, and ratio of 1.34 --No clear indication for bone marrow biopsy at this time -- Return to clinic in 6 months time with labs 1 week before  Orders Placed This Encounter  Procedures   24-Hr Ur UPEP/UIFE/Light Chains/TP    Standing Status:   Future    Expiration Date:   07/18/2024    All questions were answered. The patient knows to call the clinic with any problems, questions or concerns.  A total of more than 30 minutes were spent on this encounter with face-to-face time and non-face-to-face time, including preparing to see the patient, ordering tests and/or medications, counseling the patient and coordination of care as outlined above.   Ulysees Barns, MD Department of Hematology/Oncology [facility] Phone: [phone] Pager: [phone] Email: [email or web address]  07/23/2023 7:30 PM

## 2023-07-20 ENCOUNTER — Other Ambulatory Visit: Payer: Self-pay

## 2023-07-20 LAB — KAPPA/LAMBDA LIGHT CHAINS
Kappa free light chain: 9.7 mg/L (ref 3.3–19.4)
Kappa, lambda light chain ratio: 1.01 (ref 0.26–1.65)
Lambda free light chains: 9.6 mg/L (ref 5.7–26.3)

## 2023-07-21 ENCOUNTER — Other Ambulatory Visit: Payer: Self-pay | Admitting: Family

## 2023-07-21 DIAGNOSIS — M62838 Other muscle spasm: Secondary | ICD-10-CM

## 2023-07-23 ENCOUNTER — Other Ambulatory Visit: Payer: Self-pay | Admitting: Cardiology

## 2023-07-23 DIAGNOSIS — I10 Essential (primary) hypertension: Secondary | ICD-10-CM

## 2023-07-24 ENCOUNTER — Other Ambulatory Visit: Payer: Self-pay | Admitting: Family

## 2023-07-24 DIAGNOSIS — M62838 Other muscle spasm: Secondary | ICD-10-CM

## 2023-07-24 MED ORDER — TIZANIDINE HCL 4 MG PO TABS
ORAL_TABLET | ORAL | 0 refills | Status: DC
Start: 2023-07-24 — End: 2023-10-18

## 2023-07-29 ENCOUNTER — Other Ambulatory Visit (HOSPITAL_BASED_OUTPATIENT_CLINIC_OR_DEPARTMENT_OTHER): Payer: Self-pay | Admitting: Cardiovascular Disease

## 2023-07-29 DIAGNOSIS — I10 Essential (primary) hypertension: Secondary | ICD-10-CM

## 2023-07-31 MED ORDER — LABETALOL HCL 200 MG PO TABS: 300.0000 mg | ORAL_TABLET | Freq: Two times a day (BID) | ORAL | 3 refills | Status: DC

## 2023-08-01 LAB — MULTIPLE MYELOMA PANEL, SERUM
Albumin SerPl Elph-Mcnc: 3.8 g/dL (ref 2.9–4.4)
Albumin/Glob SerPl: 1.2 (ref 0.7–1.7)
Alpha 1: 0.2 g/dL (ref 0.0–0.4)
Alpha2 Glob SerPl Elph-Mcnc: 0.7 g/dL (ref 0.4–1.0)
B-Globulin SerPl Elph-Mcnc: 1.2 g/dL (ref 0.7–1.3)
Gamma Glob SerPl Elph-Mcnc: 1.1 g/dL (ref 0.4–1.8)
Globulin, Total: 3.2 g/dL (ref 2.2–3.9)
IgA: 102 mg/dL (ref 87–352)
IgG (Immunoglobin G), Serum: 1294 mg/dL (ref 586–1602)
IgM (Immunoglobulin M), Srm: 62 mg/dL (ref 26–217)
M Protein SerPl Elph-Mcnc: 0.7 g/dL — ABNORMAL HIGH
Total Protein ELP: 7 g/dL (ref 6.0–8.5)

## 2023-08-07 ENCOUNTER — Telehealth: Payer: Self-pay | Admitting: Cardiovascular Disease

## 2023-08-07 ENCOUNTER — Other Ambulatory Visit (HOSPITAL_BASED_OUTPATIENT_CLINIC_OR_DEPARTMENT_OTHER): Payer: Self-pay | Admitting: Cardiovascular Disease

## 2023-08-07 NOTE — Telephone Encounter (Signed)
 Patient said someone had called her to get dosage on a medication. Not sure which one

## 2023-08-07 NOTE — Telephone Encounter (Signed)
*  STAT* If patient is at the pharmacy, call can be transferred to refill team.   1. Which medications need to be refilled? (please list name of each medication and dose if known) labetalol  (NORMODYNE ) 200 MG tablet ; Semaglutide -Weight Management 1 MG/0.5ML SOAJ    2. Would you like to learn more about the convenience, safety, & potential cost savings by using the [facility]?    3. Are you open to using the [facility] (Type [facility].  ).   4. Which pharmacy/location (including street and city if local pharmacy) is medication to be sent to? [id number] - [place], [place] - [address]    5. Do they need a 30 day or 90 day supply? 90

## 2023-08-07 NOTE — Telephone Encounter (Signed)
 Pharm D had called patient regarding Wegovy, will forward for review

## 2023-08-07 NOTE — Telephone Encounter (Signed)
 LMOM for patient to return call and verify dose

## 2023-08-08 ENCOUNTER — Other Ambulatory Visit (HOSPITAL_COMMUNITY): Payer: Self-pay

## 2023-08-08 ENCOUNTER — Encounter (HOSPITAL_COMMUNITY): Payer: Self-pay

## 2023-08-08 MED ORDER — WEGOVY 1.7 MG/0.75ML ~~LOC~~ SOAJ
1.7000 mg | SUBCUTANEOUS | 0 refills | Status: DC
Start: 2023-08-08 — End: 2023-08-09
  Filled 2023-08-08: qty 3, 28d supply, fill #0

## 2023-08-08 NOTE — Telephone Encounter (Signed)
 She would like to increase wegovy to 1.7mg . [facility]

## 2023-08-09 ENCOUNTER — Other Ambulatory Visit (HOSPITAL_COMMUNITY): Payer: Self-pay

## 2023-08-09 ENCOUNTER — Other Ambulatory Visit (HOSPITAL_BASED_OUTPATIENT_CLINIC_OR_DEPARTMENT_OTHER): Payer: Self-pay

## 2023-08-09 ENCOUNTER — Other Ambulatory Visit: Payer: Self-pay | Admitting: Pharmacist Clinician (PhC)/ Clinical Pharmacy Specialist

## 2023-08-09 DIAGNOSIS — I10 Essential (primary) hypertension: Secondary | ICD-10-CM

## 2023-08-09 MED ORDER — WEGOVY 1.7 MG/0.75ML ~~LOC~~ SOAJ
1.7000 mg | SUBCUTANEOUS | 1 refills | Status: DC
  Filled 2023-08-09: qty 3, 28d supply, fill #0
  Filled 2023-09-04: qty 3, 28d supply, fill #1

## 2023-08-09 MED ORDER — LABETALOL HCL 200 MG PO TABS
300.0000 mg | ORAL_TABLET | Freq: Two times a day (BID) | ORAL | 2 refills | Status: DC
Start: 2023-08-09 — End: 2024-03-04

## 2023-08-10 ENCOUNTER — Other Ambulatory Visit (HOSPITAL_BASED_OUTPATIENT_CLINIC_OR_DEPARTMENT_OTHER): Payer: Self-pay

## 2023-08-17 ENCOUNTER — Encounter (HOSPITAL_BASED_OUTPATIENT_CLINIC_OR_DEPARTMENT_OTHER): Payer: Self-pay | Admitting: Cardiovascular Disease

## 2023-08-17 ENCOUNTER — Ambulatory Visit (HOSPITAL_BASED_OUTPATIENT_CLINIC_OR_DEPARTMENT_OTHER): Payer: 59 | Admitting: Cardiovascular Disease

## 2023-08-17 VITALS — BP 110/78 | HR 82 | Ht 69.0 in | Wt 317.3 lb

## 2023-08-17 DIAGNOSIS — I10 Essential (primary) hypertension: Secondary | ICD-10-CM

## 2023-08-17 DIAGNOSIS — R7303 Prediabetes: Secondary | ICD-10-CM | POA: Diagnosis not present

## 2023-08-17 NOTE — Patient Instructions (Signed)
Medication Instructions:  Your physician recommends that you continue on your current medications as directed. Please refer to the Current Medication list given to you today.   Labwork: FASTING LP/CMET SOON   Testing/Procedures: NONE  Follow-Up: 6 MONTHS WITH DR Avenel OR CAITLIN W NP   Any Other Special Instructions Will Be Listed Below (If Applicable).     If you need a refill on your cardiac medications before your next appointment, please call your pharmacy.

## 2023-08-17 NOTE — Progress Notes (Signed)
[facility] Follow-up:    Date:  08/17/2023   ID:  Karen, Salazar 06-Jan-1976, MRN [id number]  PCP:  Mort Sawyers, FNP  Cardiologist:  None  Nephrologist:  Referring MD: Mort Sawyers, FNP   CC: Hypertension  History of Present Illness:    [name] is a 48 y.o. female with a hx of hypertension, hyperlipidemia, IgG lambda monoclonal gammopathy, OSA, and obesity, here for follow-up. She was initially seen 12/21/2022 to establish care in the [facility]. She has been followed by her cardiologist Dr. Odis Salazar since 08/2022. She had been diagnosed with hypertension at age 58-41. She has intolerance of multiple antihypertensives due to generalized muscle cramps. Workup for secondary hypertension showed normal aldosterone and renin levels. Cortisol was low, TSH and calcium levels were normal. Metanephrines were within normal limits. She was last seen by Dr. Odis Salazar 12/08/2022 and her blood pressure was 170/105 in the clinic. At that visit, she was not taking any antihypertensives as she stated they all cause myalgias. She was agreeable to restart labetalol and hydralazine at her prior dose. They also started her on losartan 50 mg daily. She was also waiting for her CPAP. She was referred to the [facility].  At her visit 11/2022, she confirmed struggling with hypertension for about 5 years. Her home readings were averaging 150s/100s on losartan, hydralazine, and labetalol. She complained of generalized muscle cramps that were exacerbated since taking antihypertensives. She has seen a rheumatologist, immunologist, and neurologist. She was prescribed gabapentin as needed and Cymbalta, was taking both at night with subsequent improvement in both severity and frequency of her muscle cramps. She also had random chest pressure that usually occurred while lying in bed. She noted headaches attributable to high blood pressures, and occasional  dizziness upon standing. Encouraged increased hydration to minimize dizziness. We added back amlodipine 5 mg daily. Renal artery dopplers were ordered but not completed. She was seen by our pharmacist 12/2022 and her home diastolic BP had been averaging more than 100 mmHg both morning and night. Losartan was stopped and she was started on olmesartan 40 mg daily. Labetalol was changed from 200 mg TID, to 300 mg BID. She saw her PCP 04/2023 and reported orthostatic symptoms.  At her appointment 05/2023 she was trying to increase her exercise.  She reported orthostatic symptoms and near syncope.  Blood pressures were ranging in the 130s to 140s over 90s amlodipine was switched to the evening.  She was started on Wegovy.  She had a calcium score 05/2023 that revealed a score of 0.  [name] presents with persistent body stiffness and muscle cramps. The stiffness is most pronounced in the neck, shoulders, back, and legs, and is severe enough to require support when standing from a seated position. The patient also reports experiencing muscle cramps in various parts of the body, including the jawline, tongue, inner thigh, stomach, and back. These cramps are described as debilitating and occur even on days when the patient forgets to take her blood pressure medication.  She has been on a regimen of amlodipine, hydralazine, and olmesartan for hypertension, and gabapentin and Cymbalta to manage the muscle cramps. She reports that the cramps are less severe when taking the gabapentin and Cymbalta, but still occur occasionally. The patient also reports that the blood pressure is well-controlled on the current regimen.  [name] has seen multiple specialists, including a rheumatologist and a neurologist, in an attempt to identify the cause of the stiffness  and cramps. Lab results have shown an elevated CK level, but other inflammatory markers are within normal limits. The patient has also been experiencing  significant fatigue, even after minimal exertion.   She has been making efforts to lose weight and has seen some success with the use of Wegovy. However, the patient reports that physical activity is limited due to the body stiffness. Despite these challenges, the patient maintains a full-time job and runs a [employer].     Previous antihypertensives: HCTZ Metoprolol Amlodipine Spironolactone Hydralazine Losartan  Past Medical History:  Diagnosis Date   Hypertension    Murmur     Past Surgical History:  Procedure Laterality Date   ABDOMINOPLASTY     ACHILLES TENDON SURGERY  06/08/2012   Procedure: ACHILLES TENDON REPAIR;  Surgeon: Nadara Mustard, MD;  Location: [facility] OR;  Service: Orthopedics;  Laterality: Right;  Right Achilles Reconstruction   APPENDECTOMY     CHOLECYSTECTOMY     HYSTEROSCOPY WITH D & C N/A 03/06/2014   Procedure: DILATATION AND CURETTAGE With IUD Removal;  Surgeon: Serita Kyle, MD;  Location: [facility];  Service: Gynecology;  Laterality: N/A;   KNEE SURGERY     right knee arthroscropic    Current Medications: Current Meds  Medication Sig   amLODipine (NORVASC) 10 MG tablet Take 1 tablet (10 mg total) by mouth every evening.   DULoxetine (CYMBALTA) 30 MG capsule TAKE 1 CAPSULE BY MOUTH EVERY DAY   EPINEPHrine 0.3 mg/0.3 mL IJ SOAJ injection Inject 0.3 mg into the muscle as needed for anaphylaxis.   gabapentin (NEURONTIN) 300 MG capsule Take 1 capsule (300 mg total) by mouth 3 (three) times daily.   hydrALAZINE (APRESOLINE) 25 MG tablet TAKE 1 TABLET BY MOUTH THREE TIMES A DAY   ibuprofen (ADVIL) 800 MG tablet Take 800 mg by mouth every 8 (eight) hours as needed.   labetalol (NORMODYNE) 200 MG tablet Take 1.5 tablets (300 mg total) by mouth 2 (two) times daily.   levonorgestrel (MIRENA) 20 MCG/24HR IUD 1 each by Intrauterine route once.   olmesartan (BENICAR) 40 MG tablet TAKE 1 TABLET BY MOUTH EVERY DAY   scopolamine (TRANSDERM-SCOP) 1 MG/3DAYS Place  1 patch (1.5 mg total) onto the skin every 3 (three) days.   Semaglutide-Weight Management (WEGOVY) 1.7 MG/0.75ML SOAJ Inject 1.7 mg into the skin once a week.   tiZANidine (ZANAFLEX) 4 MG tablet TAKE 1/2 TO 1 TABLET BY MOUTH AT BEDTIME AS NEEDED FOR MUSCLE SPASM   Vitamin D, Ergocalciferol, (DRISDOL) 1.25 MG (50000 UNIT) CAPS capsule Take 1 capsule (50,000 Units total) by mouth every 7 (seven) days.   zolpidem (AMBIEN CR) 6.25 MG CR tablet Take 1 tablet (6.25 mg total) by mouth at bedtime as needed.     Allergies:   Bee venom   Social History   Socioeconomic History   Marital status: Married    Spouse name: Renae Fickle   Number of children: 3   Years of education: Nursing school   Highest education level: Not on file  Occupational History   Occupation: Teacher, adult education: [employer]  Tobacco Use   Smoking status: Never    Passive exposure: Never   Smokeless tobacco: Never  Vaping Use   Vaping status: Never Used  Substance and Sexual Activity   Alcohol use: Yes    Comment: once a month, 1-2 servings   Drug use: No   Sexual activity: Yes    Birth control/protection: I.U.D.  Other Topics Concern  Not on file  Social History Narrative      3 kids - ages 67 - 9   Husband - Renae Fickle   Enjoys: watching her kids play sports, spending time with husband   Exercise: not regular now - hoping to get back to it   Diet: not get currently, but hoping to do better   Social Drivers of Corporate investment banker Strain: Low Risk  (08/09/2018)   Overall Financial Resource Strain (CARDIA)    Difficulty of Paying Living Expenses: Not hard at all  Food Insecurity: No Food Insecurity (12/21/2022)   Hunger Vital Sign    Worried About Running Out of Food in the Last Year: Never true    Ran Out of Food in the Last Year: Never true  Transportation Needs: No Transportation Needs (12/21/2022)   PRAPARE - Administrator, Civil Service (Medical): No    Lack of Transportation (Non-Medical): No  Physical  Activity: Inactive (12/21/2022)   Exercise Vital Sign    Days of Exercise per Week: 0 days    Minutes of Exercise per Session: 0 min  Stress: Not on file  Social Connections: Not on file     Family History: The patient's family history includes Autism in her sister; Diabetes in her father and mother; Healthy in her daughter, daughter, and son; Heart disease in her mother; Hypertension in her father, mother, and sister.  ROS:   Please see the history of present illness.    (+) Dizziness (+) Near syncope (+) Knee pain All other systems reviewed and are negative.  EKGs/Labs/Other Studies Reviewed:     EKG Interpretation Date/Time:  Thursday August 17 2023 14:02:10 EST Ventricular Rate:  75 PR Interval:  188 QRS Duration:  86 QT Interval:  374 QTC Calculation: 417 R Axis:   29  Text Interpretation: Normal sinus rhythm T wave abnormality, consider anterior ischemia No significant change since last tracing Confirmed by Chilton Si ([id number]) on 08/17/2023 2:04:17 PM       Chest X-Ray  04/07/2023: FINDINGS: Normal sized heart. Clear lungs with normal vascularity. Thoracic spine degenerative changes.   IMPRESSION: No active disease.  Echo  08/31/2022: Sonographer Comments: Patient is obese. Image acquisition challenging due  to patient body habitus. Global longitudinal strain was attempted.   IMPRESSIONS   1. 2D Longitudinal Strain2D Strain GLS [facility]): 18.5 % 2D Strain GLS (A3C):  15.9 % 2D Strain GLS (A4C): 11.6 % 2D Strain GLS Avg:15.3 %      . Left ventricular ejection fraction, by estimation, is 55 to 60%.  Left ventricular ejection fraction by PLAX is 59 %. The left ventricle has  normal function. The left ventricle has no regional wall motion  abnormalities. There is mild concentric left   ventricular hypertrophy. Left ventricular diastolic parameters are  consistent with Grade II diastolic dysfunction (pseudonormalization).  Elevated left atrial pressure. The E/e'  is 14.1 Medial.   2. Right ventricular systolic function is normal. The right ventricular  size is normal. There is normal pulmonary artery systolic pressure.   3. Left atrial size was mildly dilated.   4. The mitral valve is normal in structure. Trivial mitral valve  regurgitation.   5. The aortic valve is normal in structure. Aortic valve regurgitation is  not visualized. No aortic stenosis is present.   6. The inferior vena cava is normal in size with greater than 50%  respiratory variability, suggesting right atrial pressure of 3 mmHg.   EKG:  EKG is personally reviewed. 05/17/2023:  Not ordered. 12/21/2022: EKG was not ordered. 08/12/2022 (Dr. Odis Salazar): Sinus rhythm, 75 bpm, LAE, nonspecific T wave abnormality.   Recent Labs: 04/07/2023: Magnesium 2.0; TSH 3.44 07/19/2023: ALT 59; BUN 7; Creatinine 0.94; Hemoglobin 13.0; Platelet Count 320; Potassium 4.2; Sodium 135   Recent Lipid Panel    Component Value Date/Time   CHOL 202 (H) 08/12/2022 1022   TRIG 135 08/12/2022 1022   HDL 41 08/12/2022 1022   CHOLHDL 5 10/08/2021 1025   VLDL 24.4 10/08/2021 1025   LDLCALC 137 (H) 08/12/2022 1022   LDLDIRECT 144 (H) 08/12/2022 1022    Physical Exam:    VS:  BP 110/78   Pulse 82   Ht 5\' 9"  (1.753 m)   Wt (!) 317 lb 4.8 oz (143.9 kg)   SpO2 98%   BMI 46.86 kg/m  , BMI Body mass index is 46.86 kg/m. GENERAL:  Well appearing HEENT: Pupils equal round and reactive, fundi not visualized, oral mucosa unremarkable NECK:  No jugular venous distention, waveform within normal limits, carotid upstroke brisk and symmetric, no bruits, no thyromegaly LUNGS:  Clear to auscultation bilaterally HEART:  RRR.  PMI not displaced or sustained,S1 and S2 within normal limits, no S3, no S4, no clicks, no rubs, no murmurs ABD:  Soft, non-tender EXT:  2 plus pulses throughout, no edema, no cyanosis, no clubbing SKIN:  No rashes, no nodules NEURO:  Cranial nerves II through XII grossly intact, motor grossly  intact throughout PSYCH:  Cognitively intact, oriented to person place and time   ASSESSMENT/PLAN:    # Hypertension Well controlled on current regimen of amlodipine, hydralazine, and olmesartan. Patient reports difficulty with midday hydralazine dose. -Continue current regimen and encourage adherence to midday hydralazine dose.  # Muscle Cramps Debilitating muscle cramps experienced throughout the body, particularly when taking antihypertensive medications. Cramps are managed with gabapentin and Cymbalta. -Continue gabapentin and Cymbalta as needed for cramp management.  # Unexplained Body Stiffness Patient reports significant body stiffness, particularly in the neck, shoulders, and legs. CK levels are elevated. Rheumatology and neurology consultations have not provided a clear diagnosis. -Recommend referral to a tertiary care center for further rheumatology evaluation.  # Weight Management Patient reports successful weight loss with UEAVWU. -Continue Wegovy for weight management.  # General Health Maintenance -Order lipid panel and comprehensive metabolic panel. -Schedule follow-up appointment in 6 months.     Screening for Secondary Hypertension:     12/21/2022    2:35 PM  Causes  Drugs/Herbals Screened     - Comments + salt, no caffeine/ Rare EtOH.  Sleep Apnea Screened  Thyroid Disease Screened  Hyperaldosteronism Screened  Pheochromocytoma Screened  Cushing's Syndrome Screened  Hyperparathyroidism Screened  Coarctation of the Aorta Screened     - Comments R arm BP higher than the L  Compliance Screened    Relevant Labs/Studies:    Latest Ref Rng & Units 07/19/2023    2:31 PM 01/04/2023    3:01 PM 11/29/2022   10:38 AM  Basic Labs  Sodium 135 - 145 mmol/L 135  137  141   Potassium 3.5 - 5.1 mmol/L 4.2  4.2  4.8   Creatinine 0.44 - 1.00 mg/dL 9.81  1.91  4.78        Latest Ref Rng & Units 04/07/2023   11:18 AM 11/29/2022   10:38 AM  Thyroid   TSH 0.35 - 5.50  uIU/mL 3.44  3.410        Latest  Ref Rng & Units 08/12/2022   10:22 AM  Renin/Aldosterone   Aldosterone 0.0 - 30.0 ng/dL 64.4   Aldos/Renin Ratio 0.0 - 30.0 10.6        Latest Ref Rng & Units 08/12/2022   10:22 AM  Metanephrines/Catecholamines   Metanephrines 0.0 - 88.0 pg/mL 42.0   Normetanephrines  0.0 - 218.9 pg/mL 201.1    Disposition:    FU with [facility] in 2 months.  Medication Adjustments/Labs and Tests Ordered: Current medicines are reviewed at length with the patient today.  Concerns regarding medicines are outlined above.   Orders Placed This Encounter  Procedures   Lipid panel   Comprehensive metabolic panel   EKG 12-Lead   No orders of the defined types were placed in this encounter.   Signed, Chilton Si, MD  08/17/2023 4:05 PM    [facility]

## 2023-09-04 ENCOUNTER — Other Ambulatory Visit (HOSPITAL_BASED_OUTPATIENT_CLINIC_OR_DEPARTMENT_OTHER): Payer: Self-pay

## 2023-09-04 ENCOUNTER — Other Ambulatory Visit: Payer: Self-pay | Admitting: Cardiovascular Disease

## 2023-09-05 ENCOUNTER — Other Ambulatory Visit (HOSPITAL_BASED_OUTPATIENT_CLINIC_OR_DEPARTMENT_OTHER): Payer: Self-pay

## 2023-09-05 MED ORDER — WEGOVY 2.4 MG/0.75ML ~~LOC~~ SOAJ
2.4000 mg | SUBCUTANEOUS | 5 refills | Status: DC
  Filled 2023-09-05: qty 3, 28d supply, fill #0
  Filled 2023-10-02: qty 3, 28d supply, fill #1
  Filled 2023-10-29: qty 3, 28d supply, fill #2
  Filled 2023-11-28: qty 3, 28d supply, fill #3
  Filled 2023-12-27: qty 3, 28d supply, fill #4
  Filled 2024-01-23: qty 3, 28d supply, fill #5

## 2023-09-08 ENCOUNTER — Other Ambulatory Visit (HOSPITAL_BASED_OUTPATIENT_CLINIC_OR_DEPARTMENT_OTHER): Payer: Self-pay

## 2023-10-04 ENCOUNTER — Other Ambulatory Visit: Payer: Self-pay

## 2023-10-04 ENCOUNTER — Telehealth: Payer: Self-pay | Admitting: Pharmacy Technician

## 2023-10-04 ENCOUNTER — Other Ambulatory Visit (HOSPITAL_BASED_OUTPATIENT_CLINIC_OR_DEPARTMENT_OTHER): Payer: Self-pay

## 2023-10-04 ENCOUNTER — Other Ambulatory Visit (HOSPITAL_COMMUNITY): Payer: Self-pay

## 2023-10-04 NOTE — Telephone Encounter (Signed)
    No prior auth needed. On insurance the copay was over 1200.00 with the coupon its 650.00 that's the cheapest this will be. Rx filled with pharmacy now

## 2023-10-05 ENCOUNTER — Encounter: Payer: Self-pay | Admitting: Family

## 2023-10-06 ENCOUNTER — Other Ambulatory Visit (HOSPITAL_BASED_OUTPATIENT_CLINIC_OR_DEPARTMENT_OTHER): Payer: Self-pay

## 2023-10-17 ENCOUNTER — Other Ambulatory Visit (HOSPITAL_BASED_OUTPATIENT_CLINIC_OR_DEPARTMENT_OTHER): Payer: Self-pay | Admitting: Cardiovascular Disease

## 2023-10-18 ENCOUNTER — Other Ambulatory Visit: Payer: Self-pay | Admitting: Family

## 2023-10-18 DIAGNOSIS — M62838 Other muscle spasm: Secondary | ICD-10-CM

## 2023-10-18 MED ORDER — TIZANIDINE HCL 4 MG PO TABS
ORAL_TABLET | ORAL | 0 refills | Status: DC
Start: 2023-10-18 — End: 2023-11-15

## 2023-11-01 ENCOUNTER — Other Ambulatory Visit (HOSPITAL_BASED_OUTPATIENT_CLINIC_OR_DEPARTMENT_OTHER): Payer: Self-pay

## 2023-11-03 ENCOUNTER — Other Ambulatory Visit (HOSPITAL_BASED_OUTPATIENT_CLINIC_OR_DEPARTMENT_OTHER): Payer: Self-pay

## 2023-11-15 ENCOUNTER — Other Ambulatory Visit: Payer: Self-pay | Admitting: Family

## 2023-11-15 DIAGNOSIS — M62838 Other muscle spasm: Secondary | ICD-10-CM

## 2023-11-22 ENCOUNTER — Other Ambulatory Visit: Payer: Self-pay | Admitting: Cardiology

## 2023-11-22 DIAGNOSIS — I10 Essential (primary) hypertension: Secondary | ICD-10-CM

## 2023-11-26 ENCOUNTER — Other Ambulatory Visit: Payer: Self-pay | Admitting: Neurology

## 2023-11-28 ENCOUNTER — Other Ambulatory Visit (HOSPITAL_BASED_OUTPATIENT_CLINIC_OR_DEPARTMENT_OTHER): Payer: Self-pay

## 2023-12-01 ENCOUNTER — Other Ambulatory Visit: Payer: Self-pay

## 2023-12-01 ENCOUNTER — Other Ambulatory Visit: Payer: Self-pay | Admitting: Neurology

## 2023-12-01 NOTE — Telephone Encounter (Signed)
 Pt has scheduled her 1 yr f/u on wait list as well.  Pt has confirmed no new issues, but states she is not feeling well likely because of being without this medication for a few days.  Pt is asking if the medication can be called in while waiting for her appointment

## 2023-12-01 NOTE — Telephone Encounter (Signed)
 Last seen on 11/29/22 Follow up scheduled on 02/27/24

## 2023-12-10 ENCOUNTER — Other Ambulatory Visit: Payer: Self-pay | Admitting: Neurology

## 2023-12-10 DIAGNOSIS — R252 Cramp and spasm: Secondary | ICD-10-CM

## 2023-12-11 ENCOUNTER — Encounter: Payer: Self-pay | Admitting: Family

## 2023-12-11 ENCOUNTER — Ambulatory Visit: Admitting: Family

## 2023-12-11 ENCOUNTER — Ambulatory Visit (INDEPENDENT_AMBULATORY_CARE_PROVIDER_SITE_OTHER)
Admission: RE | Admit: 2023-12-11 | Discharge: 2023-12-11 | Disposition: A | Discharge status: Home / Self Care | Source: Ambulatory Visit | Attending: Family | Admitting: Family

## 2023-12-11 VITALS — BP 130/80 | HR 88 | Temp 98.1°F | Ht 64.0 in | Wt 302.4 lb

## 2023-12-11 DIAGNOSIS — E559 Vitamin D deficiency, unspecified: Secondary | ICD-10-CM | POA: Diagnosis not present

## 2023-12-11 DIAGNOSIS — M542 Cervicalgia: Secondary | ICD-10-CM | POA: Diagnosis not present

## 2023-12-11 DIAGNOSIS — M2550 Pain in unspecified joint: Secondary | ICD-10-CM | POA: Diagnosis not present

## 2023-12-11 DIAGNOSIS — I10 Essential (primary) hypertension: Secondary | ICD-10-CM

## 2023-12-11 DIAGNOSIS — R7303 Prediabetes: Secondary | ICD-10-CM | POA: Diagnosis not present

## 2023-12-11 DIAGNOSIS — E538 Deficiency of other specified B group vitamins: Secondary | ICD-10-CM | POA: Diagnosis not present

## 2023-12-11 DIAGNOSIS — G4452 New daily persistent headache (NDPH): Secondary | ICD-10-CM | POA: Diagnosis not present

## 2023-12-11 DIAGNOSIS — Z5689 Other problems related to employment: Secondary | ICD-10-CM | POA: Insufficient documentation

## 2023-12-11 MED ORDER — PREDNISONE 10 MG (21) PO TBPK
ORAL_TABLET | ORAL | 0 refills | Status: DC
Start: 2023-12-11 — End: 2024-02-06

## 2023-12-11 MED ORDER — BUTALBITAL-APAP-CAFFEINE 50-325-40 MG PO TABS
1.0000 | ORAL_TABLET | Freq: Four times a day (QID) | ORAL | 0 refills | Status: AC | PRN
Start: 2023-12-11 — End: ?

## 2023-12-11 MED ORDER — METHYLPREDNISOLONE ACETATE 80 MG/ML IJ SUSP
80.0000 mg | Freq: Once | INTRAMUSCULAR | Status: AC
Start: 2023-12-11 — End: 2023-12-11
  Administered 2023-12-11: 80 mg via INTRAMUSCULAR

## 2023-12-11 NOTE — Progress Notes (Signed)
 Established Patient Office Visit  Subjective:   Patient ID: [name], female    DOB: 05-04-1976  Age: 48 y.o. MRN: [id number]  CC:  Chief Complaint  Patient presents with   Medical Management of Chronic Issues    HPI: [name] is a 48 y.o. female presenting on 12/11/2023 for Medical Management of Chronic Issues  About two weeks ago started with excruciating neck pain' she has tried otc ibuprofen  tylenol  creams, ice without much relief. She is taking muscle relaxer as well as gabapentin . Her neck is stiff and hard to move due to pain. Thi sis causing her head to hurt and is making her nauseous. No chest pain no palpitations or sob. The pain is radiating up the neck and pulsating. Mainly in the spinal process of the cervical spine. She does also notice that her joints are beginning to be pain, knees, hands, lower ext.   She has been checking her blood pressure at home average around 135 over 80.  No blurry vision.   MGUS, currently following with hematology. Often with muscle pain which is helped with muscle relaxer and gabapentin  but still with some stiffness.   Does want to pursue FMLA due to multiple health concerns that are causing increased fatigue and pain that limit ability to work proactively. 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 When she works from home she is able to maneuver a bit more, elevate legs, and stand up and move around more often and has ice readily available for when really flaring. After sitting more than 30 minutes she has to stretch her legs just to even help with the stiffness. The pain affects attention and focus due to only being able to concentrate on the pain. She wants to pursue remote learning from home. She does administrative work. She does find that at times she needs to call out due to these symptoms, but wouldn't need to call out if she was working from home. She does state at times will have to  call out 1-3 days a week at 24 hour periods at a time as the fatigue is so overwhelming and her body is drained.      ROS: Negative unless specifically indicated above in HPI.   Relevant past medical history reviewed and updated as indicated.   Allergies and medications reviewed and updated.   Current Outpatient Medications:    amLODipine  (NORVASC ) 10 MG tablet, Take 1 tablet (10 mg total) by mouth every evening., Disp: 90 tablet, Rfl: 3   butalbital-acetaminophen -caffeine (FIORICET) 50-325-40 MG tablet, Take 1 tablet by mouth every 6 (six) hours as needed for headache., Disp: 14 tablet, Rfl: 0   DULoxetine  (CYMBALTA ) 30 MG capsule, TAKE 1 CAPSULE BY MOUTH EVERY DAY, Disp: 90 capsule, Rfl: 0   EPINEPHrine  0.3 mg/0.3 mL IJ SOAJ injection, Inject 0.3 mg into the muscle as needed for anaphylaxis., Disp: 1 each, Rfl: 0   gabapentin  (NEURONTIN ) 300 MG capsule, TAKE 1 CAPSULE BY MOUTH THREE TIMES A DAY, Disp: 90 capsule, Rfl: 11   hydrALAZINE  (APRESOLINE ) 25 MG tablet, TAKE 1 TABLET BY MOUTH THREE TIMES A DAY, Disp: 270 tablet, Rfl: 2   ibuprofen  (ADVIL ) 800 MG tablet, Take 800 mg by mouth every 8 (eight) hours as needed., Disp: , Rfl:    labetalol  (NORMODYNE ) 200 MG tablet, Take 1.5 tablets (300 mg total) by mouth 2 (two) times daily., Disp: 270 tablet, Rfl: 2  levonorgestrel (MIRENA) 20 MCG/24HR IUD, 1 each by Intrauterine route once., Disp: , Rfl:    olmesartan  (BENICAR ) 40 MG tablet, TAKE 1 TABLET BY MOUTH EVERY DAY, Disp: 90 tablet, Rfl: 1   predniSONE (STERAPRED UNI-PAK 21 TAB) 10 MG (21) TBPK tablet, Take as directed, Disp: 1 each, Rfl: 0   Semaglutide -Weight Management (WEGOVY ) 2.4 MG/0.75ML SOAJ, Inject 2.4 mg into the skin once a week., Disp: 3 mL, Rfl: 5   tiZANidine  (ZANAFLEX ) 4 MG tablet, TAKE 1/2 TO 1 TABLET BY MOUTH AT BEDTIME AS NEEDED FOR MUSCLE SPASM, Disp: 90 tablet, Rfl: 1   zolpidem  (AMBIEN  CR) 6.25 MG CR tablet, Take 1 tablet (6.25 mg total) by mouth at bedtime as needed.,  Disp: 30 tablet, Rfl: 0  Allergies  Allergen Reactions   Bee Venom Anaphylaxis    Objective:   BP 130/80 (BP Location: Left Arm, Patient Position: Sitting, Cuff Size: Large)   Pulse 88   Temp 98.1 F (36.7 C) (Temporal)   Ht 5\' 4"  (1.626 m)   Wt (!) 302 lb 6.4 oz (137.2 kg)   SpO2 98%   BMI 51.91 kg/m    Physical Exam Vitals reviewed.  Constitutional:      General: She is not in acute distress.    Appearance: Normal appearance. She is normal weight. She is not ill-appearing, toxic-appearing or diaphoretic.  HENT:     Head: Normocephalic.  Cardiovascular:     Rate and Rhythm: Normal rate.  Pulmonary:     Effort: Pulmonary effort is normal.  Musculoskeletal:     Cervical back: Rigidity present. Pain with movement and spinous process tenderness present. No muscular tenderness. Decreased range of motion.  Neurological:     General: No focal deficit present.     Mental Status: She is alert and oriented to person, place, and time. Mental status is at baseline.  Psychiatric:        Mood and Affect: Mood normal.        Behavior: Behavior normal.        Thought Content: Thought content normal.        Judgment: Judgment normal.     Assessment & Plan:  Neck pain, acute Assessment & Plan: Xray neck pending results R/o acute process Continue with muscle relaxer prn  Depo medrol in office today 80 mg IM  Reviewed kidney function, acceptable.  Rx prednisone pack  Advised pt not to start until tomorrow. Tylenol  prn.  Heat/ice to sit. Neck exercises.    Orders: -     methylPREDNISolone Acetate -     predniSONE; Take as directed  Dispense: 1 each; Refill: 0 -     CBC with Differential/Platelet -     DG Cervical Spine Complete; Future  New daily persistent headache Assessment & Plan: Suspect tension headache.  Rx fioricet trial for abrupter  Increase oral fluids Monitor blood pressure Sed rate and tsh ordered pending results.   Orders: -      Butalbital-APAP-Caffeine; Take 1 tablet by mouth every 6 (six) hours as needed for headache.  Dispense: 14 tablet; Refill: 0 -     TSH  Vitamin D  deficiency -     VITAMIN D  25 Hydroxy (Vit-D Deficiency, Fractures)  Prediabetes -     Hemoglobin A1c  Essential hypertension  Low serum vitamin B12 -     Vitamin B12  Polyarthralgia Assessment & Plan: Repeat autoimmune workup negative in the past but now new arthralgias   Orders: -  Sedimentation rate -     Rheumatoid factor -     C-reactive protein -     ANA Screen,IFA, with Reflex to Titer and Pattern (REFL)  Difficulty performing work activities Assessment & Plan: Pain interfering with ability to function at work, focus at work and carry out simple tasks due to pain.  At home she has the ability to move around, change position, have ice nearby if needed/and also heat vs sitting in chair all day at work which exacerbates condition.  Recommendation for pt to work remotely from home, same hours as normal however just to work remote.  Also recommendation for intermittent fmla for 3 days a week up to 24 hour periods at a time.  F/u one month to re evaluate.    Pain, joint, multiple sites     Follow up plan: Return in about 1 month (around 01/11/2024) for f/u FMLA.  Felicita Horns, FNP

## 2023-12-11 NOTE — Assessment & Plan Note (Addendum)
 Pain interfering with ability to function at work, focus at work and carry out simple tasks due to pain.  At home she has the ability to move around, change position, have ice nearby if needed/and also heat vs sitting in chair all day at work which exacerbates condition.  Recommendation for pt to work remotely from home, same hours as normal however just to work remote.  Also recommendation for intermittent fmla for 3 days a week up to 24 hour periods at a time.  F/u one month to re evaluate.

## 2023-12-11 NOTE — Assessment & Plan Note (Signed)
 Xray neck pending results R/o acute process Continue with muscle relaxer prn  Depo medrol in office today 80 mg IM  Reviewed kidney function, acceptable.  Rx prednisone pack  Advised pt not to start until tomorrow. Tylenol  prn.  Heat/ice to sit. Neck exercises.

## 2023-12-11 NOTE — Assessment & Plan Note (Signed)
 Repeat autoimmune workup negative in the past but now new arthralgias

## 2023-12-11 NOTE — Assessment & Plan Note (Signed)
 Suspect tension headache.  Rx fioricet trial for abrupter  Increase oral fluids Monitor blood pressure Sed rate and tsh ordered pending results.

## 2023-12-12 ENCOUNTER — Ambulatory Visit: Payer: Self-pay | Admitting: Family

## 2023-12-12 DIAGNOSIS — E559 Vitamin D deficiency, unspecified: Secondary | ICD-10-CM

## 2023-12-12 LAB — VITAMIN B12: Vitamin B-12: 105 pg/mL — ABNORMAL LOW (ref 211–911)

## 2023-12-12 LAB — HEMOGLOBIN A1C: Hgb A1c MFr Bld: 5.5 % (ref 4.6–6.5)

## 2023-12-12 LAB — CBC WITH DIFFERENTIAL/PLATELET
Basophils Absolute: 0 10*3/uL (ref 0.0–0.1)
Basophils Relative: 0.4 % (ref 0.0–3.0)
Eosinophils Absolute: 0.1 10*3/uL (ref 0.0–0.7)
Eosinophils Relative: 2 % (ref 0.0–5.0)
HCT: 40.6 % (ref 36.0–46.0)
Hemoglobin: 13 g/dL (ref 12.0–15.0)
Lymphocytes Relative: 36.6 % (ref 12.0–46.0)
Lymphs Abs: 1.8 10*3/uL (ref 0.7–4.0)
MCHC: 31.9 g/dL (ref 30.0–36.0)
MCV: 85.1 fl (ref 78.0–100.0)
Monocytes Absolute: 0.4 10*3/uL (ref 0.1–1.0)
Monocytes Relative: 8.3 % (ref 3.0–12.0)
Neutro Abs: 2.6 10*3/uL (ref 1.4–7.7)
Neutrophils Relative %: 52.7 % (ref 43.0–77.0)
Platelets: 296 10*3/uL (ref 150.0–400.0)
RBC: 4.77 Mil/uL (ref 3.87–5.11)
RDW: 14.4 % (ref 11.5–15.5)
WBC: 5 10*3/uL (ref 4.0–10.5)

## 2023-12-12 LAB — RHEUMATOID FACTOR: Rheumatoid fact SerPl-aCnc: <10 [IU]/mL (ref <14)

## 2023-12-12 LAB — ANA SCREEN,IFA, WITH REFLEX TO TITER AND PATTERN (REFL): ANA SCREEN, IFA: NEGATIVE

## 2023-12-12 LAB — C-REACTIVE PROTEIN: CRP: <1 mg/dL (ref 0.5–20.0)

## 2023-12-12 LAB — VITAMIN D 25 HYDROXY (VIT D DEFICIENCY, FRACTURES): VITD: 10.58 ng/mL — ABNORMAL LOW (ref 30.00–100.00)

## 2023-12-12 LAB — TSH: TSH: 3.21 u[IU]/mL (ref 0.35–5.50)

## 2023-12-12 LAB — SEDIMENTATION RATE: Sed Rate: 20 mm/h (ref 0–20)

## 2023-12-12 MED ORDER — CHOLECALCIFEROL 1.25 MG (50000 UT) PO TABS: 1.0000 | ORAL_TABLET | ORAL | 0 refills | Status: AC

## 2023-12-27 ENCOUNTER — Other Ambulatory Visit (HOSPITAL_BASED_OUTPATIENT_CLINIC_OR_DEPARTMENT_OTHER): Payer: Self-pay

## 2024-01-01 NOTE — Progress Notes (Signed)
 Patient: [name] Date of Birth: Nov 14, 1975  Reason for Visit: Follow up History from: Patient Primary Neurologist: [name]   ASSESSMENT AND PLAN 48 y.o. year old female   Muscle Cramps  MGUS B12 deficiency Vitamin D  deficiency  - Muscle cramps significantly improved with Cymbalta  30 mg daily, gabapentin  300 mg up to 3 times daily - Follows with hematology, [name], under surveillance for MGUS - Hold off on EMG for now, symptoms have significantly improved - Continue close follow-up with primary care for replacement of vitamin deficiencies - Recheck CK level Sept 2024 203, April 2024 188, Jan 2024 260 - I refilled her medications for 1 year, will kindly ask PCP to refill going forward, return for new or worsening symptoms  HISTORY  [name] is a 48 year old female, seen in request by Dr. Nicholas Salazar for evaluation of muscle cramps, initial evaluation November 29, 2022   I reviewed and summarized the referring note. PMHX. Chronic insomnia HTN Obesity   She has been diagnosed with hypertension for 2 years, the last couple years tried different medications, reported since she started taking blood pressure medication in 2022, she developed muscle twitching, involving different body part, muscle cramps, tried different [place] without change   Also see rheumatologist no clear etiology found   She describes sudden onset temporal muscle spasm, involving calf, spine, upper extremity, transient last for few minutes, and multiple episodes a day, very painful,   Laboratory evaluation January 2024: A1c 5.8, gliadin antibody was negative, normal ESR, C-reactive protein, CPK was 260, LDL was 144, normal TSH, hemoglobin of 13.8, CMP creatinine of 1.09 June 2022, M protein was mildly elevated 0.5, 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 Gracie Salazar ordered comprehensive labs April 2024, Vitamin D  14.7, CK 188, B12 180.  Follows with hematology/oncology [name] for MGUS, monitoring every 6 months. Muscle cramps are a lot better!! Occasionally has breakthrough muscle cramps, towards the evening. Mentioned some neck stiffness, given steroids with good benefit. Cymbalta  30 mg daily, taken gabapentin  300 mg twice daily, will likely incorporate a midday dose. Takes muscle relaxer at night tizanidine . The muscle cramps were everywhere even in her tongue, now usually in her feet and hands, certain twisting movement can exacerbate. Recent Vitamin D  was low 10.58, B 12 105. Starting IM injection.    REVIEW OF SYSTEMS: Out of a complete 14 system review of symptoms, the patient complains only of the following symptoms, and all other reviewed systems are negative.  See HPI  ALLERGIES: Allergies  Allergen Reactions   Bee Venom Anaphylaxis    HOME MEDICATIONS: Outpatient Medications Prior to Visit  Medication Sig Dispense Refill   amLODipine  (NORVASC ) 10 MG tablet Take 1 tablet (10 mg total) by mouth every evening. 90 tablet 3   butalbital -acetaminophen -caffeine  (FIORICET) 50-325-40 MG tablet Take 1 tablet by mouth every 6 (six) hours as needed for headache. 14 tablet 0   Cholecalciferol  1.25 MG (50000 UT) TABS Take 1 tablet by mouth once a week. 12 tablet 0   EPINEPHrine  0.3 mg/0.3 mL IJ SOAJ injection Inject 0.3 mg into the muscle as needed for anaphylaxis. 1 each 0   gabapentin  (NEURONTIN ) 300 MG capsule TAKE 1 CAPSULE BY MOUTH THREE TIMES A DAY 90 capsule 11   hydrALAZINE  (APRESOLINE ) 25 MG tablet TAKE 1 TABLET BY MOUTH THREE TIMES A DAY 270 tablet 2   ibuprofen  (ADVIL ) 800 MG tablet Take 800 mg by mouth  every 8 (eight) hours as needed.     labetalol  (NORMODYNE ) 200 MG tablet Take 1.5 tablets (300 mg total) by mouth 2 (two) times daily. 270 tablet 2   levonorgestrel (MIRENA) 20 MCG/24HR IUD 1 each by Intrauterine route once.     olmesartan  (BENICAR ) 40 MG tablet TAKE 1 TABLET BY MOUTH EVERY DAY 90 tablet 1    predniSONE  (STERAPRED UNI-PAK 21 TAB) 10 MG (21) TBPK tablet Take as directed 1 each 0   Semaglutide -Weight Management (WEGOVY ) 2.4 MG/0.75ML SOAJ Inject 2.4 mg into the skin once a week. 3 mL 5   tiZANidine  (ZANAFLEX ) 4 MG tablet TAKE 1/2 TO 1 TABLET BY MOUTH AT BEDTIME AS NEEDED FOR MUSCLE SPASM 90 tablet 1   zolpidem  (AMBIEN  CR) 6.25 MG CR tablet Take 1 tablet (6.25 mg total) by mouth at bedtime as needed. 30 tablet 0   DULoxetine  (CYMBALTA ) 30 MG capsule TAKE 1 CAPSULE BY MOUTH EVERY DAY 90 capsule 0   No facility-administered medications prior to visit.    PAST MEDICAL HISTORY: Past Medical History:  Diagnosis Date   Hypertension    Murmur     PAST SURGICAL HISTORY: Past Surgical History:  Procedure Laterality Date   ABDOMINOPLASTY     ACHILLES TENDON SURGERY  06/08/2012   Procedure: ACHILLES TENDON REPAIR;  Surgeon: Karen Ford, MD;  Location: [facility] OR;  Service: Orthopedics;  Laterality: Right;  Right Achilles Reconstruction   APPENDECTOMY     CHOLECYSTECTOMY     HYSTEROSCOPY WITH D & C N/A 03/06/2014   Procedure: DILATATION AND CURETTAGE With IUD Removal;  Surgeon: Karen Orn, MD;  Location: [facility];  Service: Gynecology;  Laterality: N/A;   KNEE SURGERY     right knee arthroscropic    FAMILY HISTORY: Family History  Problem Relation Age of Onset   Heart disease Mother    Diabetes Mother    Hypertension Mother    Diabetes Father    Hypertension Father    Hypertension Sister    Autism Sister    Healthy Daughter    Healthy Daughter    Healthy Son     SOCIAL HISTORY: Social History   Socioeconomic History   Marital status: Married    Spouse name: [name]   Number of children: 3   Years of education: Nursing school   Highest education level: Bachelor's degree (e.g., BA, AB, BS)  Occupational History   Occupation: Teacher, adult education: [employer]  Tobacco Use   Smoking status: Never    Passive exposure: Never   Smokeless tobacco: Never  Vaping Use   Vaping  status: Never Used  Substance and Sexual Activity   Alcohol use: Yes    Comment: once a month, 1-2 servings   Drug use: No   Sexual activity: Yes    Birth control/protection: I.U.D.  Other Topics Concern   Not on file  Social History Narrative      3 kids - ages 27 - 31   Husband - [name]   Enjoys: watching her kids play sports, spending time with husband   Exercise: not regular now - hoping to get back to it   Diet: not get currently, but hoping to do better   Social Drivers of Health   Financial Resource Strain: Low Risk  (12/11/2023)   Overall Financial Resource Strain (CARDIA)    Difficulty of Paying Living Expenses: Not hard at all  Food Insecurity: No Food Insecurity (12/11/2023)   Hunger Vital Sign  Worried About Programme researcher, broadcasting/film/video in the Last Year: Never true    Ran Out of Food in the Last Year: Never true  Transportation Needs: No Transportation Needs (12/11/2023)   PRAPARE - Administrator, Civil Service (Medical): No    Lack of Transportation (Non-Medical): No  Physical Activity: Unknown (12/11/2023)   Exercise Vital Sign    Days of Exercise per Week: 1 day    Minutes of Exercise per Session: Patient declined  Stress: Stress Concern Present (12/11/2023)   [employer] of Occupational Health - Occupational Stress Questionnaire    Feeling of Stress : Very much  Social Connections: Socially Integrated (12/11/2023)   Social Connection and Isolation Panel [NHANES]    Frequency of Communication with Friends and Family: More than three times a week    Frequency of Social Gatherings with Friends and Family: Once a week    Attends Religious Services: 1 to 4 times per year    Active Member of [employer] or Organizations: Yes    Attends Banker Meetings: 1 to 4 times per year    Marital Status: Married  Catering manager Violence: Not on file    PHYSICAL EXAM  Vitals:   01/02/24 1448  BP: 131/74  Pulse: 71  SpO2: 98%  Height: 5\' 9"  (1.753 m)    Body mass index is 44.66 kg/m.  Generalized: Well developed, in no acute distress  Neurological examination  Mentation: Alert oriented to time, place, history taking. Follows all commands speech and language fluent Cranial nerve II-XII: Pupils were equal round reactive to light. Extraocular movements were full, visual field were full on confrontational test. Facial sensation and strength were normal. Head turning and shoulder shrug  were normal and symmetric. Motor: The motor testing reveals 5 over 5 strength of all 4 extremities. Good symmetric motor tone is noted throughout.  Sensory: Sensory testing is intact to soft touch on all 4 extremities. No evidence of extinction is noted.  Coordination: Cerebellar testing reveals good finger-nose-finger and heel-to-shin bilaterally.  Gait and station: Gait is normal.  Reflexes: Deep tendon reflexes are symmetric and normal bilaterally.   DIAGNOSTIC DATA (LABS, IMAGING, TESTING) - I reviewed patient records, labs, notes, testing and imaging myself where available.  Lab Results  Component Value Date   WBC 5.0 12/11/2023   HGB 13.0 12/11/2023   HCT 40.6 12/11/2023   MCV 85.1 12/11/2023   PLT 296.0 12/11/2023      Component Value Date/Time   NA 135 07/19/2023 1431   NA 141 11/29/2022 1038   K 4.2 07/19/2023 1431   CL 105 07/19/2023 1431   CO2 24 07/19/2023 1431   GLUCOSE 93 07/19/2023 1431   BUN 7 07/19/2023 1431   BUN 10 11/29/2022 1038   CREATININE 0.94 07/19/2023 1431   CREATININE 0.94 05/24/2022 0855   CALCIUM 9.4 07/19/2023 1431   PROT 7.1 07/19/2023 1431   PROT 6.8 11/29/2022 1038   ALBUMIN 4.4 07/19/2023 1431   ALBUMIN 4.4 11/29/2022 1038   AST 46 (H) 07/19/2023 1431   ALT 59 (H) 07/19/2023 1431   ALKPHOS 47 07/19/2023 1431   BILITOT 0.8 07/19/2023 1431   GFRNONAA >60 07/19/2023 1431   GFRAA >60 08/09/2018 1002   Lab Results  Component Value Date   CHOL 202 (H) 08/12/2022   HDL 41 08/12/2022   LDLCALC 137 (H)  08/12/2022   LDLDIRECT 144 (H) 08/12/2022   TRIG 135 08/12/2022   CHOLHDL 5 10/08/2021   Lab  Results  Component Value Date   HGBA1C 5.5 12/11/2023   Lab Results  Component Value Date   VITAMINB12 105 (L) 12/11/2023   Lab Results  Component Value Date   TSH 3.21 12/11/2023    Jeanmarie Millet, AGNP-C, DNP 01/02/2024, 3:16 PM [facility] [address], [address] [place], [place] [address] [phone]

## 2024-01-02 ENCOUNTER — Ambulatory Visit: Admitting: Neurology

## 2024-01-02 ENCOUNTER — Encounter: Payer: Self-pay | Admitting: Neurology

## 2024-01-02 VITALS — BP 131/74 | HR 71 | Ht 69.0 in

## 2024-01-02 DIAGNOSIS — M2550 Pain in unspecified joint: Secondary | ICD-10-CM | POA: Diagnosis not present

## 2024-01-02 DIAGNOSIS — R252 Cramp and spasm: Secondary | ICD-10-CM

## 2024-01-02 DIAGNOSIS — R748 Abnormal levels of other serum enzymes: Secondary | ICD-10-CM | POA: Diagnosis not present

## 2024-01-02 MED ORDER — DULOXETINE HCL 30 MG PO CPEP: 30.0000 mg | ORAL_CAPSULE | Freq: Every day | ORAL | 3 refills | Status: AC

## 2024-01-02 NOTE — Patient Instructions (Signed)
 Great to see you today.  Continue current medications.  Please call for any worsening symptoms.  Thanks!!

## 2024-01-03 ENCOUNTER — Ambulatory Visit: Payer: Self-pay | Admitting: Neurology

## 2024-01-03 LAB — CK: Total CK: 181 U/L (ref 32–182)

## 2024-01-05 NOTE — Progress Notes (Signed)
 Chart reviewed, agree above plan ?

## 2024-01-09 ENCOUNTER — Ambulatory Visit (INDEPENDENT_AMBULATORY_CARE_PROVIDER_SITE_OTHER)

## 2024-01-09 DIAGNOSIS — E538 Deficiency of other specified B group vitamins: Secondary | ICD-10-CM | POA: Diagnosis not present

## 2024-01-09 MED ORDER — CYANOCOBALAMIN 1000 MCG/ML IJ SOLN
1000.0000 ug | Freq: Once | INTRAMUSCULAR | Status: AC
  Administered 2024-01-09: 1000 ug via INTRAMUSCULAR

## 2024-01-09 NOTE — Progress Notes (Signed)
 Per orders of Dr. Claire Crick, injection of B-12 given by Doretha Ganja in left deltoid. Patient tolerated injection well. Patient will make appointment for 1 month for 3 months.

## 2024-01-20 IMAGING — US US RENAL
1 series · 14 of 25 positions shown · non-contrast
Comparison: Ultrasound 10/15/2008

CLINICAL DATA: Proteinuria

EXAM:
RENAL / URINARY TRACT ULTRASOUND COMPLETE

[Series 1: us renal · 0.26mm/px · 14 of 47 slices shown]
[im 1/47]
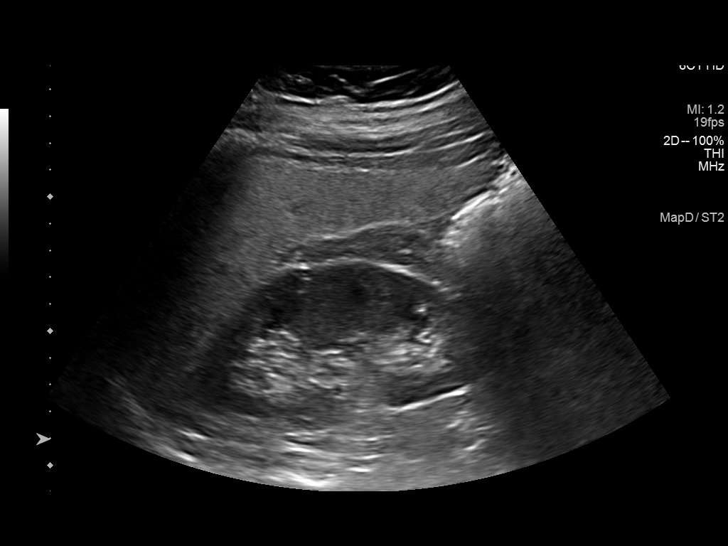
[im 4/47]
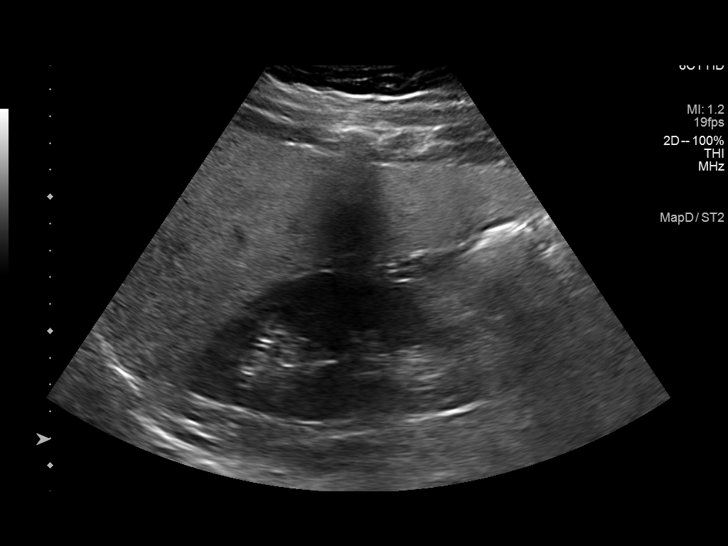
[im 8/47]
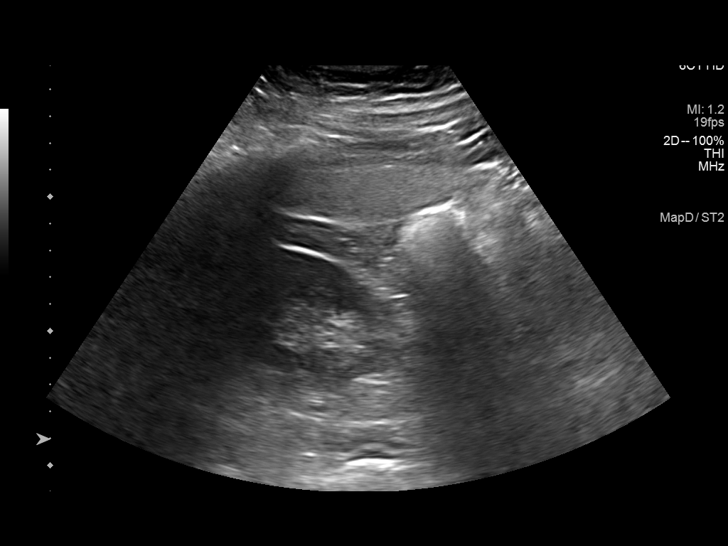
[im 12/47]
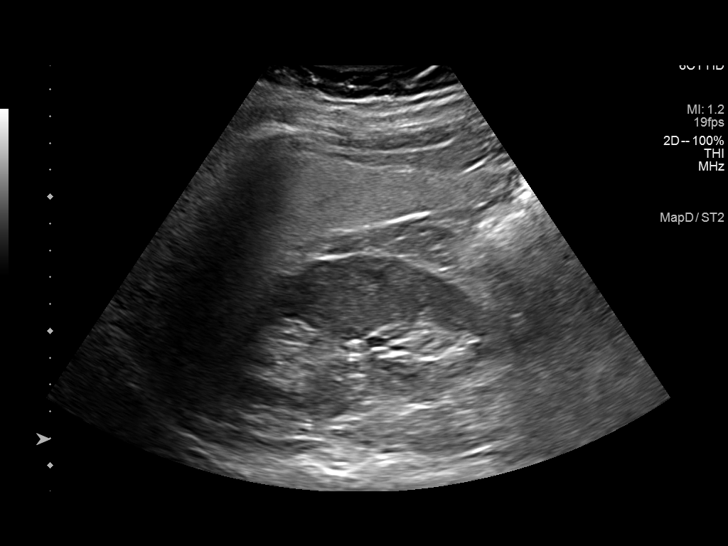
[im 16/47]
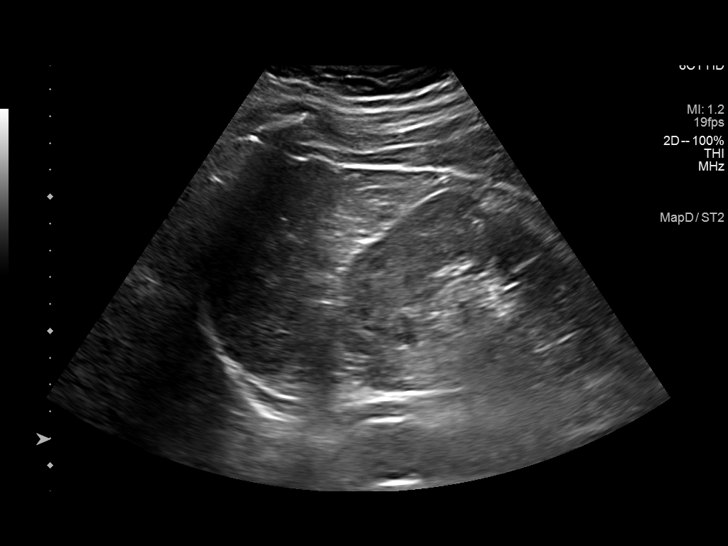
[im 18/47]
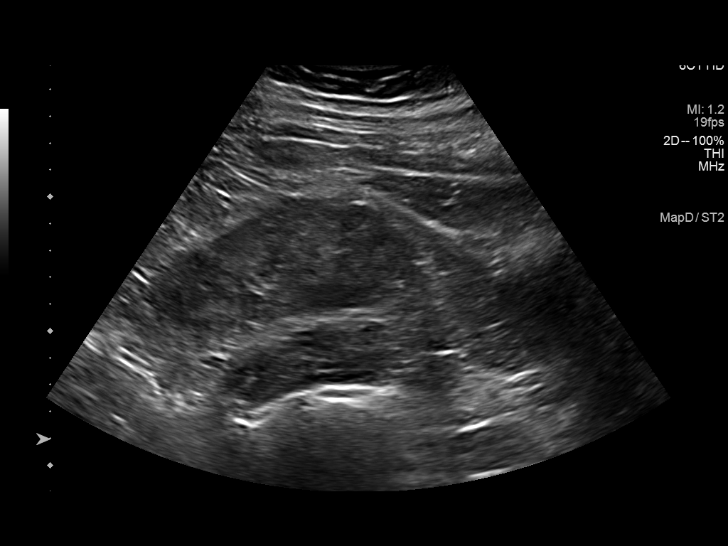
[im 22/47]
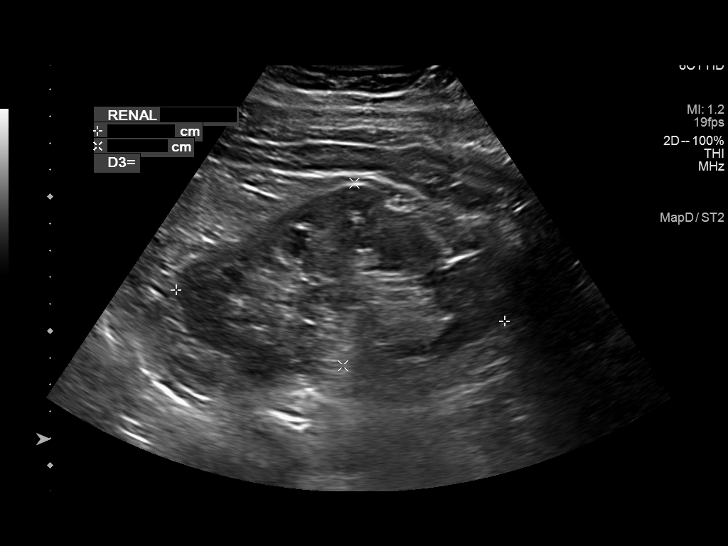
[im 25/47]
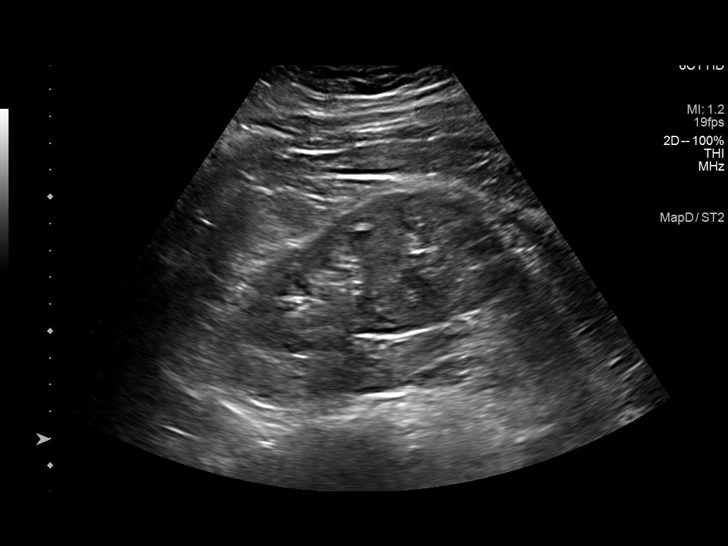
[im 29/47]
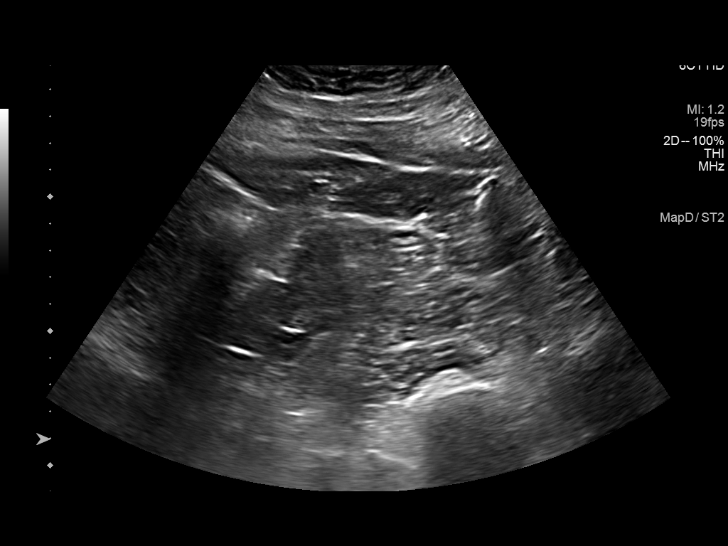
[im 31/47]
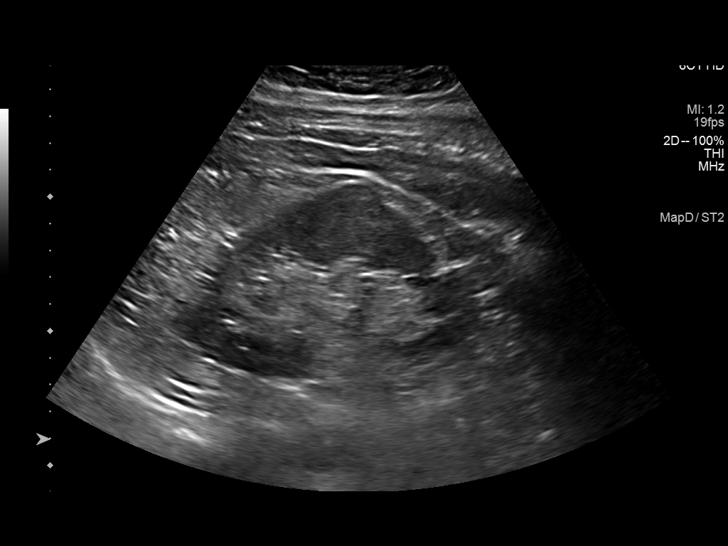
[im 35/47]
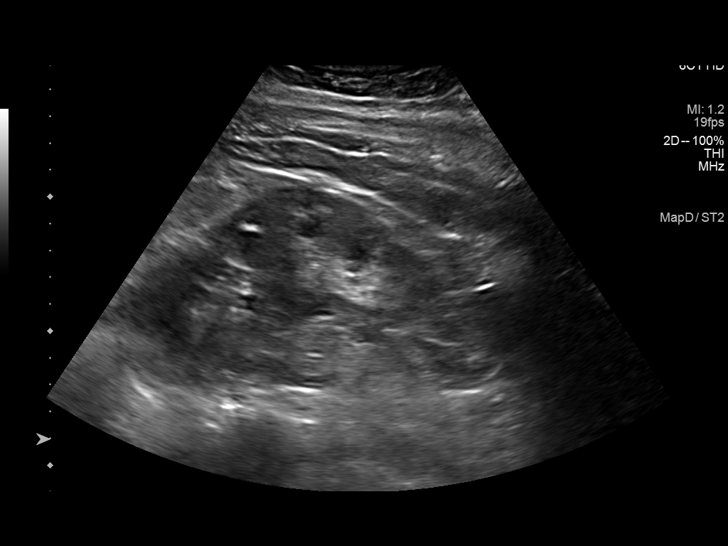
[im 39/47]
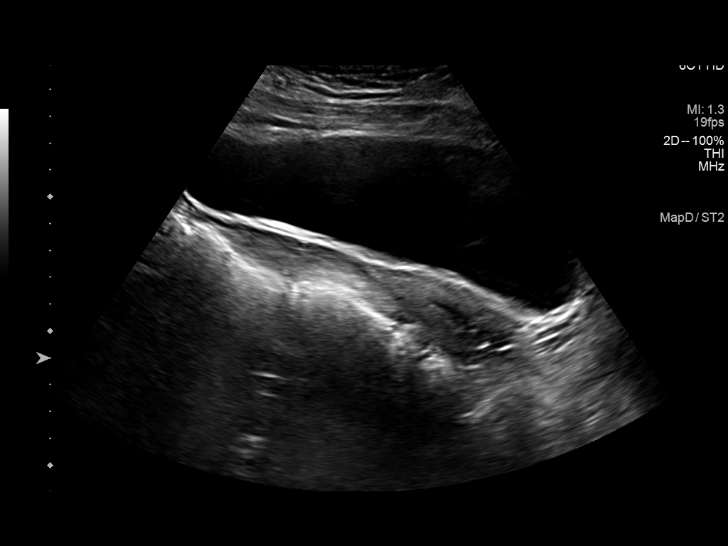
[im 43/47]
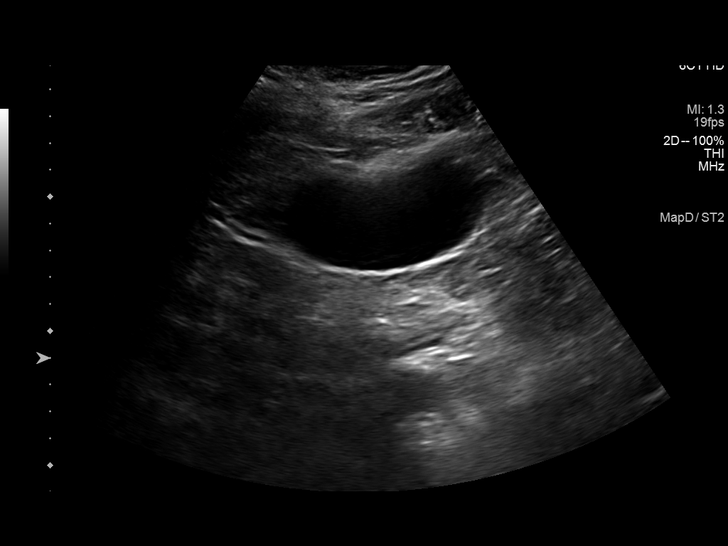
[im 47/47]
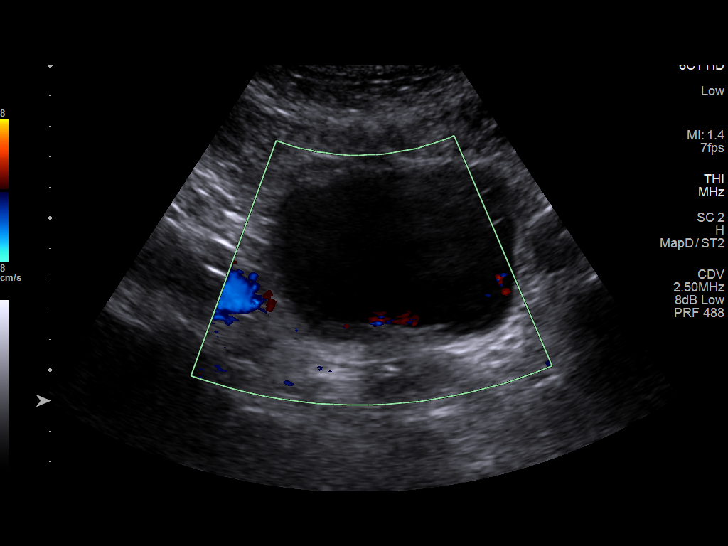

[14 of 25 positions shown; findings below may reference images not displayed]

FINDINGS: Right Kidney:

Renal measurements: 11.1 x 5.6 x 4.8 cm = volume: 156.7 mL.
Echogenicity within normal limits. No mass or hydronephrosis
visualized.

Left Kidney:

Renal measurements: 12.3 x 6.8 x 6.4 cm = volume: 279.3 mL.
Echogenicity within normal limits. No mass or hydronephrosis
visualized.

Bladder:

Appears normal for degree of bladder distention.

Other:

Incidental note made of echogenic liver parenchyma.
IMPRESSION: 1. Normal ultrasound appearance of the kidneys
2. The liver appears echogenic suggesting hepatic steatosis, this
may be correlated with LFTs

## 2024-01-24 ENCOUNTER — Other Ambulatory Visit (HOSPITAL_BASED_OUTPATIENT_CLINIC_OR_DEPARTMENT_OTHER): Payer: Self-pay

## 2024-02-02 ENCOUNTER — Other Ambulatory Visit (HOSPITAL_BASED_OUTPATIENT_CLINIC_OR_DEPARTMENT_OTHER): Payer: Self-pay | Admitting: Cardiovascular Disease

## 2024-02-04 ENCOUNTER — Encounter: Payer: Self-pay | Admitting: Family

## 2024-02-04 DIAGNOSIS — M542 Cervicalgia: Secondary | ICD-10-CM

## 2024-02-06 MED ORDER — PREDNISONE 10 MG (21) PO TBPK: ORAL_TABLET | ORAL | 0 refills | Status: AC

## 2024-02-06 NOTE — Addendum Note (Signed)
 Addended by: CORWIN ANTU on: 02/06/2024 08:53 AM   Modules accepted: Orders

## 2024-02-07 ENCOUNTER — Encounter: Payer: Self-pay | Admitting: Family Medicine

## 2024-02-07 ENCOUNTER — Ambulatory Visit (INDEPENDENT_AMBULATORY_CARE_PROVIDER_SITE_OTHER): Admitting: Family Medicine

## 2024-02-07 VITALS — BP 138/84 | HR 84 | Temp 99.7°F | Ht 64.0 in | Wt 293.4 lb

## 2024-02-07 DIAGNOSIS — M5030 Other cervical disc degeneration, unspecified cervical region: Secondary | ICD-10-CM

## 2024-02-07 DIAGNOSIS — M5412 Radiculopathy, cervical region: Secondary | ICD-10-CM | POA: Insufficient documentation

## 2024-02-07 MED ORDER — METHYLPREDNISOLONE ACETATE 40 MG/ML IJ SUSP
40.0000 mg | Freq: Once | INTRAMUSCULAR | Status: AC
  Administered 2024-02-07: 80 mg via INTRAMUSCULAR

## 2024-02-07 NOTE — Progress Notes (Signed)
 Patient ID: [name], female    DOB: 05-02-1976, 48 y.o.   MRN: [id number]  This visit was conducted in person.  BP 138/84   Pulse 84   Temp 99.7 F (37.6 C) (Oral)   Ht 5' 4 (1.626 m)   Wt 293 lb 6.4 oz (133.1 kg)   SpO2 100%   BMI 50.36 kg/m    CC:  Chief Complaint  Patient presents with   Neck Pain    On and off since May. Patient will be traveling out of the country soon and wants to see if she can get a steroid shot. Patient has an prescription for the prednisone  to take with her when she travels.     Subjective:   HPI: [name] is a 48 y.o. female presenting on 02/07/2024 for Neck Pain (On and off since May. Patient will be traveling out of the country soon and wants to see if she can get a steroid shot. Patient has an prescription for the prednisone  to take with her when she travels. )  Reviewed recent office visit note from Dec 11, 2023 with her PCP Karen Patrick, FNP for similar issue.  Acute neck pain ongoing off and on since beginning of May. Initially she had tried over-the-counter ibuprofen , Tylenol , creams and ice without relief.  She is using muscle relaxer and gabapentin .  She is also on Cymbalta , doing okay At that office visit x-ray was performed of the neck: IMPRESSION: Degenerative disc disease with narrowing of the C4-C5 C5-C6 disc spaces with mild anterior osteophytic changes. Treated with Depo-Medrol  in the office 80 mg IM  and prednisone  taper with temporary relief but pain has returned. At that time she also had a workup for autoimmune arthritis/polyarthralgia.  She describes pain as neck stiffness, posterior cervical headache.  Pain radiates up the neck.    Keeping her up at night.  Worse with moving.  No numbness, no weakness in arms.  Pain has returned in last 2 weeks.   Long term PT and referral to spine MD.   She is going out of the country for 10 days tommorow.   Tolerates steroids well.     Lab Results  Component  Value Date   HGBA1C 5.5 12/11/2023     Relevant past medical, surgical, family and social history reviewed and updated as indicated. Interim medical history since our last visit reviewed. Allergies and medications reviewed and updated. Outpatient Medications Prior to Visit  Medication Sig Dispense Refill   amLODipine  (NORVASC ) 10 MG tablet Take 1 tablet (10 mg total) by mouth every evening. 90 tablet 3   butalbital -acetaminophen -caffeine  (FIORICET) 50-325-40 MG tablet Take 1 tablet by mouth every 6 (six) hours as needed for headache. 14 tablet 0   Cholecalciferol  1.25 MG (50000 UT) TABS Take 1 tablet by mouth once a week. 12 tablet 0   DULoxetine  (CYMBALTA ) 30 MG capsule Take 1 capsule (30 mg total) by mouth daily. 90 capsule 3   EPINEPHrine  0.3 mg/0.3 mL IJ SOAJ injection Inject 0.3 mg into the muscle as needed for anaphylaxis. 1 each 0   gabapentin  (NEURONTIN ) 300 MG capsule TAKE 1 CAPSULE BY MOUTH THREE TIMES A DAY 90 capsule 11   hydrALAZINE  (APRESOLINE ) 25 MG tablet TAKE 1 TABLET BY MOUTH THREE TIMES A DAY 270 tablet 2   ibuprofen  (ADVIL ) 800 MG tablet Take 800 mg by mouth every 8 (eight) hours as needed.     labetalol  (NORMODYNE ) 200 MG tablet Take 1.5  tablets (300 mg total) by mouth 2 (two) times daily. 270 tablet 2   levonorgestrel (MIRENA) 20 MCG/24HR IUD 1 each by Intrauterine route once.     olmesartan  (BENICAR ) 40 MG tablet TAKE 1 TABLET BY MOUTH EVERY DAY 90 tablet 1   predniSONE  (STERAPRED UNI-PAK 21 TAB) 10 MG (21) TBPK tablet Take as directed 1 each 0   Semaglutide -Weight Management (WEGOVY ) 2.4 MG/0.75ML SOAJ Inject 2.4 mg into the skin once a week. 3 mL 5   tiZANidine  (ZANAFLEX ) 4 MG tablet TAKE 1/2 TO 1 TABLET BY MOUTH AT BEDTIME AS NEEDED FOR MUSCLE SPASM 90 tablet 1   zolpidem  (AMBIEN  CR) 6.25 MG CR tablet Take 1 tablet (6.25 mg total) by mouth at bedtime as needed. 30 tablet 0   No facility-administered medications prior to visit.     Per HPI unless specifically  indicated in ROS section below Review of Systems  Constitutional:  Negative for fatigue and fever.  HENT:  Negative for congestion.   Eyes:  Negative for pain.  Respiratory:  Negative for cough and shortness of breath.   Cardiovascular:  Negative for chest pain, palpitations and leg swelling.  Gastrointestinal:  Negative for abdominal pain.  Genitourinary:  Negative for dysuria and vaginal bleeding.  Musculoskeletal:  Positive for neck pain and neck stiffness. Negative for back pain.  Neurological:  Negative for syncope, light-headedness and headaches.  Psychiatric/Behavioral:  Negative for dysphoric mood.    Objective:  BP 138/84   Pulse 84   Temp 99.7 F (37.6 C) (Oral)   Ht 5' 4 (1.626 m)   Wt 293 lb 6.4 oz (133.1 kg)   SpO2 100%   BMI 50.36 kg/m   Wt Readings from Last 3 Encounters:  02/07/24 293 lb 6.4 oz (133.1 kg)  12/11/23 (!) 302 lb 6.4 oz (137.2 kg)  08/17/23 (!) 317 lb 4.8 oz (143.9 kg)      Physical Exam Constitutional:      General: She is not in acute distress.    Appearance: Normal appearance. She is well-developed. She is not ill-appearing or toxic-appearing.  HENT:     Head: Normocephalic.     Right Ear: Hearing, tympanic membrane, ear canal and external ear normal. Tympanic membrane is not erythematous, retracted or bulging.     Left Ear: Hearing, tympanic membrane, ear canal and external ear normal. Tympanic membrane is not erythematous, retracted or bulging.     Nose: No mucosal edema or rhinorrhea.     Right Sinus: No maxillary sinus tenderness or frontal sinus tenderness.     Left Sinus: No maxillary sinus tenderness or frontal sinus tenderness.     Mouth/Throat:     Pharynx: Uvula midline.  Eyes:     General: Lids are normal. Lids are everted, no foreign bodies appreciated.     Conjunctiva/sclera: Conjunctivae normal.     Pupils: Pupils are equal, round, and reactive to light.  Neck:     Thyroid : No thyroid  mass or thyromegaly.     Vascular: No  carotid bruit.     Trachea: Trachea normal.  Cardiovascular:     Rate and Rhythm: Normal rate and regular rhythm.     Pulses: Normal pulses.     Heart sounds: Normal heart sounds, S1 normal and S2 normal. No murmur heard.    No friction rub. No gallop.  Pulmonary:     Effort: Pulmonary effort is normal. No tachypnea or respiratory distress.     Breath sounds: Normal breath sounds. No decreased  breath sounds, wheezing, rhonchi or rales.  Abdominal:     General: Bowel sounds are normal.     Palpations: Abdomen is soft.     Tenderness: There is no abdominal tenderness.  Musculoskeletal:     Cervical back: Neck supple. Tenderness and bony tenderness present. No swelling, deformity, erythema, signs of trauma, lacerations, rigidity, spasms or torticollis. Pain with movement present. Decreased range of motion.  Skin:    General: Skin is warm and dry.     Findings: No rash.  Neurological:     Mental Status: She is alert and oriented to person, place, and time.     Cranial Nerves: Cranial nerves 2-12 are intact.     Sensory: Sensation is intact.     Motor: Motor function is intact. No weakness.     Coordination: Coordination is intact. Coordination normal.     Gait: Gait is intact.  Psychiatric:        Mood and Affect: Mood is not anxious or depressed.        Speech: Speech normal.        Behavior: Behavior normal. Behavior is cooperative.        Thought Content: Thought content normal.        Judgment: Judgment normal.       Results for orders placed or performed in visit on 01/02/24  CK   Collection Time: 01/02/24  3:23 PM  Result Value Ref Range   Total CK 181 32 - 182 U/L    Assessment and Plan  Cervical radiculopathy Assessment & Plan: Acute flare of chronic issue Long-term treatment plans include PT and referral to spine specialist for possible additional imaging such as MRI and epidural steroid injection. Given acute flare, upcoming trip tommorow as well as additional  failed treatments including ibuprofen , Tylenol , topicals, ice, muscle relaxer, gabapentin  and Cymbalta , I will treat today with repeat Depo-Medrol  80 mg IM x 1 Followed by prednisone  Dosepak if needed.  She will follow-up with PCP upon return for further recommendations if not improving.  Reviewed red flags including perineal numbness, upper and lower extremity weakness, severe pain and incontinence.  If these issues occur she will see an MD on her trip.   DDD (degenerative disc disease), cervical    No follow-ups on file.   Greig Ring, MD

## 2024-02-07 NOTE — Assessment & Plan Note (Signed)
 Acute flare of chronic issue Long-term treatment plans include PT and referral to spine specialist for possible additional imaging such as MRI and epidural steroid injection. Given acute flare, upcoming trip tommorow as well as additional failed treatments including ibuprofen , Tylenol , topicals, ice, muscle relaxer, gabapentin  and Cymbalta , I will treat today with repeat Depo-Medrol  80 mg IM x 1 Followed by prednisone  Dosepak if needed.  She will follow-up with PCP upon return for further recommendations if not improving.  Reviewed red flags including perineal numbness, upper and lower extremity weakness, severe pain and incontinence.  If these issues occur she will see an MD on her trip.

## 2024-02-07 NOTE — Addendum Note (Signed)
 Addended by: TRUDY DAVENE CROME on: 02/07/2024 11:02 AM   Modules accepted: Orders

## 2024-02-08 ENCOUNTER — Ambulatory Visit

## 2024-02-21 ENCOUNTER — Ambulatory Visit

## 2024-02-22 ENCOUNTER — Other Ambulatory Visit (HOSPITAL_BASED_OUTPATIENT_CLINIC_OR_DEPARTMENT_OTHER): Payer: Self-pay

## 2024-02-22 ENCOUNTER — Other Ambulatory Visit: Payer: Self-pay | Admitting: Cardiovascular Disease

## 2024-02-22 DIAGNOSIS — E66813 Obesity, class 3: Secondary | ICD-10-CM

## 2024-02-22 MED ORDER — WEGOVY 2.4 MG/0.75ML ~~LOC~~ SOAJ
2.4000 mg | SUBCUTANEOUS | 5 refills | Status: DC
  Filled 2024-02-22: qty 3, 28d supply, fill #0
  Filled 2024-04-02: qty 3, 28d supply, fill #1
  Filled 2024-05-01: qty 3, 28d supply, fill #2
  Filled 2024-05-28: qty 3, 28d supply, fill #3
  Filled 2024-06-24: qty 3, 28d supply, fill #4
  Filled 2024-07-22: qty 3, 28d supply, fill #5

## 2024-02-27 ENCOUNTER — Ambulatory Visit: Payer: Self-pay | Admitting: Neurology

## 2024-02-28 ENCOUNTER — Other Ambulatory Visit (HOSPITAL_BASED_OUTPATIENT_CLINIC_OR_DEPARTMENT_OTHER): Payer: Self-pay | Admitting: Family

## 2024-03-04 ENCOUNTER — Other Ambulatory Visit: Payer: Self-pay | Admitting: Family

## 2024-03-04 ENCOUNTER — Other Ambulatory Visit: Payer: Self-pay | Admitting: Cardiovascular Disease

## 2024-03-04 ENCOUNTER — Other Ambulatory Visit: Payer: Self-pay

## 2024-03-04 DIAGNOSIS — I10 Essential (primary) hypertension: Secondary | ICD-10-CM

## 2024-03-04 MED ORDER — AMLODIPINE BESYLATE 10 MG PO TABS: 10.0000 mg | ORAL_TABLET | Freq: Every evening | ORAL | 1 refills | Status: DC

## 2024-03-04 MED ORDER — LABETALOL HCL 200 MG PO TABS: 300.0000 mg | ORAL_TABLET | Freq: Two times a day (BID) | ORAL | 1 refills | Status: AC

## 2024-03-04 NOTE — Telephone Encounter (Signed)
 Copied from CRM [id number]. Topic: Clinical - Medication Refill >> Mar 04, 2024  1:33 PM [place] J wrote: Medication:  amLODipine  (NORVASC ) 10 MG tablet labetalol  (NORMODYNE ) 200 MG tablet  Has the patient contacted their pharmacy? Yes (Agent: If no, request that the patient contact the pharmacy for the refill. If patient does not wish to contact the pharmacy document the reason why and proceed with request.) (Agent: If yes, when and what did the pharmacy advise?) Pharmacy would not refill due to medication being under a different provider name.  This is the patient's preferred pharmacy:  [facility] [phone] [facility], [place] - [address] [place] [address] Phone: [phone] Fax: [phone]  Is this the correct pharmacy for this prescription? Yes If no, delete pharmacy and type the correct one.   Has the prescription been filled recently? No  Is the patient out of the medication? Yes  Has the patient been seen for an appointment in the last year OR does the patient have an upcoming appointment? Yes  Can we respond through MyChart? Yes  Agent: Please be advised that Rx refills may take up to 3 business days. We ask that you follow-up with your pharmacy.

## 2024-03-05 ENCOUNTER — Ambulatory Visit

## 2024-03-18 ENCOUNTER — Encounter: Payer: Self-pay | Admitting: *Deleted

## 2024-04-03 ENCOUNTER — Other Ambulatory Visit: Payer: Self-pay

## 2024-04-03 ENCOUNTER — Other Ambulatory Visit (HOSPITAL_BASED_OUTPATIENT_CLINIC_OR_DEPARTMENT_OTHER): Payer: Self-pay

## 2024-04-24 ENCOUNTER — Other Ambulatory Visit (HOSPITAL_BASED_OUTPATIENT_CLINIC_OR_DEPARTMENT_OTHER): Payer: Self-pay | Admitting: Cardiovascular Disease

## 2024-05-01 ENCOUNTER — Other Ambulatory Visit (HOSPITAL_BASED_OUTPATIENT_CLINIC_OR_DEPARTMENT_OTHER): Payer: Self-pay

## 2024-05-02 ENCOUNTER — Other Ambulatory Visit: Payer: Self-pay | Admitting: Family

## 2024-05-02 DIAGNOSIS — M62838 Other muscle spasm: Secondary | ICD-10-CM

## 2024-05-28 ENCOUNTER — Other Ambulatory Visit (HOSPITAL_BASED_OUTPATIENT_CLINIC_OR_DEPARTMENT_OTHER): Payer: Self-pay

## 2024-05-31 ENCOUNTER — Other Ambulatory Visit (HOSPITAL_BASED_OUTPATIENT_CLINIC_OR_DEPARTMENT_OTHER): Payer: Self-pay

## 2024-06-24 ENCOUNTER — Other Ambulatory Visit (HOSPITAL_BASED_OUTPATIENT_CLINIC_OR_DEPARTMENT_OTHER): Payer: Self-pay

## 2024-07-18 ENCOUNTER — Other Ambulatory Visit: Payer: Self-pay | Admitting: Hematology and Oncology

## 2024-07-18 ENCOUNTER — Inpatient Hospital Stay: Payer: BC Managed Care – PPO | Attending: Hematology and Oncology | Admitting: Hematology and Oncology

## 2024-07-18 ENCOUNTER — Other Ambulatory Visit: Payer: BC Managed Care – PPO

## 2024-07-18 VITALS — BP 140/83 | HR 81 | Temp 98.0°F | Resp 14 | Wt 294.7 lb

## 2024-07-18 DIAGNOSIS — D472 Monoclonal gammopathy: Secondary | ICD-10-CM

## 2024-07-18 LAB — CMP (CANCER CENTER ONLY)
ALT: 25 U/L (ref 0–44)
AST: 25 U/L (ref 15–41)
Albumin: 4.6 g/dL (ref 3.5–5.0)
Alkaline Phosphatase: 45 U/L (ref 38–126)
Anion gap: 10 (ref 5–15)
BUN: 11 mg/dL (ref 6–20)
CO2: 23 mmol/L (ref 22–32)
Calcium: 9.2 mg/dL (ref 8.9–10.3)
Chloride: 105 mmol/L (ref 98–111)
Creatinine: 0.96 mg/dL (ref 0.44–1.00)
GFR, Estimated: >60 mL/min (ref >60)
Glucose, Bld: 89 mg/dL (ref 70–99)
Potassium: 4.1 mmol/L (ref 3.5–5.1)
Sodium: 137 mmol/L (ref 135–145)
Total Bilirubin: 0.6 mg/dL (ref 0.0–1.2)
Total Protein: 7.5 g/dL (ref 6.5–8.1)

## 2024-07-18 LAB — LACTATE DEHYDROGENASE: LDH: 159 U/L (ref 105–235)

## 2024-07-18 LAB — CBC WITH DIFFERENTIAL (CANCER CENTER ONLY)
Abs Immature Granulocytes: 0.02 K/uL (ref 0.00–0.07)
Basophils Absolute: 0 K/uL (ref 0.0–0.1)
Basophils Relative: 1 %
Eosinophils Absolute: 0.2 K/uL (ref 0.0–0.5)
Eosinophils Relative: 3 %
HCT: 37.8 % (ref 36.0–46.0)
Hemoglobin: 12.2 g/dL (ref 12.0–15.0)
Immature Granulocytes: 0 %
Lymphocytes Relative: 34 %
Lymphs Abs: 1.7 K/uL (ref 0.7–4.0)
MCH: 26.9 pg (ref 26.0–34.0)
MCHC: 32.3 g/dL (ref 30.0–36.0)
MCV: 83.4 fL (ref 80.0–100.0)
Monocytes Absolute: 0.5 K/uL (ref 0.1–1.0)
Monocytes Relative: 9 %
Neutro Abs: 2.7 K/uL (ref 1.7–7.7)
Neutrophils Relative %: 53 %
Platelet Count: 286 K/uL (ref 150–400)
RBC: 4.53 MIL/uL (ref 3.87–5.11)
RDW: 14.2 % (ref 11.5–15.5)
WBC Count: 5.1 K/uL (ref 4.0–10.5)
nRBC: 0 % (ref 0.0–0.2)

## 2024-07-18 NOTE — Progress Notes (Signed)
 [facility] [phone]   [phone]  PROGRESS NOTE  Patient Care Team: Corwin Antu, FNP as PCP - General (Family Medicine)  Hematological/Oncological History # IgG Lambda Monoclonal Gammopathy  05/24/2022: SPEP showed M protein 0.4 with IgG lambda specificity on IFE 06/27/2022: establish care with Dr. Federico   Interval History:  [name] 48 y.o. female with medical history significant for IgG lambda MGUS who presents for a follow up visit. The patient's last visit was on 07/19/2023. In the interim since the last visit she has had no major changes in her health.  On exam today [name] reports she is been well overall with interim since our last visit 6 months ago.  She reports her energy levels are low at about a 3 or 4 out of 10.  She reports she has to take naps throughout the day.  She reports her appetite remains good although she is having some occasional lightheadedness when she stands up.  She is not having any dizziness or shortness of breath.  She did receive her flu shot but is had no infectious symptoms otherwise such as runny nose, sore throat, cough.  She denies any fevers, chills, sweats.  She reports that she does have some chronic neck and back pain as well as some occasional pain in her hips.  She reports no urinary issues such as bubbling, foaming, or change in the color of the urine.  Overall she feels well and has no additional questions concerns or complaints today.  A full 10 point ROS is otherwise negative.  MEDICAL HISTORY:  Past Medical History:  Diagnosis Date   Hypertension    Murmur     SURGICAL HISTORY: Past Surgical History:  Procedure Laterality Date   ABDOMINOPLASTY     ACHILLES TENDON SURGERY  06/08/2012   Procedure: ACHILLES TENDON REPAIR;  Surgeon: Jerona LULLA Sage, MD;  Location: [facility] OR;  Service: Orthopedics;  Laterality: Right;  Right Achilles Reconstruction   APPENDECTOMY     CHOLECYSTECTOMY      HYSTEROSCOPY WITH D & C N/A 03/06/2014   Procedure: DILATATION AND CURETTAGE With IUD Removal;  Surgeon: Dickie DELENA Carder, MD;  Location: [facility] OR[facility];  Service: Gynecology;  Laterality: N/A;   KNEE SURGERY     right knee arthroscropic    SOCIAL HISTORY: Social History   Socioeconomic History   Marital status: Married    Spouse name: Deward   Number of children: 3   Years of education: Nursing school   Highest education level: Bachelor's degree (e.g., BA, AB, BS)  Occupational History   Occupation: Teacher, Adult Education: [employer]  Tobacco Use   Smoking status: Never    Passive exposure: Never   Smokeless tobacco: Never  Vaping Use   Vaping status: Never Used  Substance and Sexual Activity   Alcohol use: Yes    Comment: once a month, 1-2 servings   Drug use: No   Sexual activity: Yes    Birth control/protection: I.U.D.  Other Topics Concern   Not on file  Social History Narrative      3 kids - ages 54 - 71   Husband - Deward   Enjoys: watching her kids play sports, spending time with husband   Exercise: not regular now - hoping to get back to it   Diet: not get currently, but hoping to do better   Social Drivers of Health   Tobacco Use: Low Risk (02/07/2024)   Patient History  Smoking Tobacco Use: Never    Smokeless Tobacco Use: Never    Passive Exposure: Never  Financial Resource Strain: Low Risk (12/11/2023)   Overall Financial Resource Strain (CARDIA)    Difficulty of Paying Living Expenses: Not hard at all  Food Insecurity: No Food Insecurity (12/11/2023)   Hunger Vital Sign    Worried About Running Out of Food in the Last Year: Never true    Ran Out of Food in the Last Year: Never true  Transportation Needs: No Transportation Needs (12/11/2023)   PRAPARE - Administrator, Civil Service (Medical): No    Lack of Transportation (Non-Medical): No  Physical Activity: Unknown (12/11/2023)   Exercise Vital Sign    Days of Exercise per Week: 1 day    Minutes of Exercise  per Session: Patient declined  Stress: Stress Concern Present (12/11/2023)   [employer] of Occupational Health - Occupational Stress Questionnaire    Feeling of Stress : Very much  Social Connections: Socially Integrated (12/11/2023)   Social Connection and Isolation Panel    Frequency of Communication with Friends and Family: More than three times a week    Frequency of Social Gatherings with Friends and Family: Once a week    Attends Religious Services: 1 to 4 times per year    Active Member of Clubs or Organizations: Yes    Attends Banker Meetings: 1 to 4 times per year    Marital Status: Married  Catering Manager Violence: Not on file  Depression (PHQ2-9): Medium Risk (02/07/2024)   Depression (PHQ2-9)    PHQ-2 Score: 6  Alcohol Screen: Low Risk (12/11/2023)   Alcohol Screen    Last Alcohol Screening Score (AUDIT): 2  Housing: Unknown (12/11/2023)   Housing Stability Vital Sign    Unable to Pay for Housing in the Last Year: No    Number of Times Moved in the Last Year: Not on file    Homeless in the Last Year: No  Utilities: Not on file  Health Literacy: Not on file    FAMILY HISTORY: Family History  Problem Relation Age of Onset   Heart disease Mother    Diabetes Mother    Hypertension Mother    Diabetes Father    Hypertension Father    Hypertension Sister    Autism Sister    Healthy Daughter    Healthy Daughter    Healthy Son     ALLERGIES:  is allergic to bee venom.  MEDICATIONS:  Current Outpatient Medications  Medication Sig Dispense Refill   amLODipine  (NORVASC ) 10 MG tablet Take 1 tablet (10 mg total) by mouth every evening. 90 tablet 1   butalbital -acetaminophen -caffeine  (FIORICET) 50-325-40 MG tablet Take 1 tablet by mouth every 6 (six) hours as needed for headache. 14 tablet 0   Cholecalciferol  1.25 MG (50000 UT) TABS Take 1 tablet by mouth once a week. 12 tablet 0   DULoxetine  (CYMBALTA ) 30 MG capsule Take 1 capsule (30 mg total) by  mouth daily. 90 capsule 3   EPINEPHrine  0.3 mg/0.3 mL IJ SOAJ injection Inject 0.3 mg into the muscle as needed for anaphylaxis. 1 each 0   gabapentin  (NEURONTIN ) 300 MG capsule TAKE 1 CAPSULE BY MOUTH THREE TIMES A DAY 90 capsule 11   hydrALAZINE  (APRESOLINE ) 25 MG tablet TAKE 1 TABLET BY MOUTH THREE TIMES A DAY 270 tablet 2   ibuprofen  (ADVIL ) 800 MG tablet Take 800 mg by mouth every 8 (eight) hours as needed.  labetalol  (NORMODYNE ) 200 MG tablet TAKE 1.5 TABLETS (300 MG TOTAL) BY MOUTH 2 (TWO) TIMES DAILY. 270 tablet 2   labetalol  (NORMODYNE ) 200 MG tablet Take 1.5 tablets (300 mg total) by mouth 2 (two) times daily. 270 tablet 1   levonorgestrel (MIRENA) 20 MCG/24HR IUD 1 each by Intrauterine route once.     olmesartan  (BENICAR ) 40 MG tablet TAKE 1 TABLET BY MOUTH EVERY DAY 90 tablet 1   predniSONE  (STERAPRED UNI-PAK 21 TAB) 10 MG (21) TBPK tablet Take as directed 1 each 0   semaglutide -weight management (WEGOVY ) 2.4 MG/0.75ML SOAJ SQ injection Inject 2.4 mg into the skin once a week. 3 mL 5   tiZANidine  (ZANAFLEX ) 4 MG tablet TAKE 1/2 TO 1 TABLET BY MOUTH AT BEDTIME AS NEEDED FOR MUSCLE SPASM 90 tablet 1   zolpidem  (AMBIEN  CR) 6.25 MG CR tablet Take 1 tablet (6.25 mg total) by mouth at bedtime as needed. 30 tablet 0   No current facility-administered medications for this visit.    REVIEW OF SYSTEMS:   Constitutional: ( - ) fevers, ( - )  chills , ( - ) night sweats Eyes: ( - ) blurriness of vision, ( - ) double vision, ( - ) watery eyes Ears, nose, mouth, throat, and face: ( - ) mucositis, ( - ) sore throat Respiratory: ( - ) cough, ( - ) dyspnea, ( - ) wheezes Cardiovascular: ( - ) palpitation, ( - ) chest discomfort, ( - ) lower extremity swelling Gastrointestinal:  ( - ) nausea, ( - ) heartburn, ( - ) change in bowel habits Skin: ( - ) abnormal skin rashes Lymphatics: ( - ) new lymphadenopathy, ( - ) easy bruising Neurological: ( - ) numbness, ( - ) tingling, ( - ) new  weaknesses Behavioral/Psych: ( - ) mood change, ( - ) new changes  All other systems were reviewed with the patient and are negative.  PHYSICAL EXAMINATION:   Vitals:   07/18/24 1430  BP: (!) 140/83  Pulse: 81  Resp: 14  Temp: 98 F (36.7 C)  SpO2: 97%   Filed Weights   07/18/24 1430  Weight: 294 lb 11.2 oz (133.7 kg)    GENERAL: Well-appearing middle-aged African [place] female, alert, no distress and comfortable SKIN: skin color, texture, turgor are normal, no rashes or significant lesions EYES: conjunctiva are pink and non-injected, sclera clear LUNGS: clear to auscultation and percussion with normal breathing effort HEART: regular rate & rhythm and no murmurs and no lower extremity edema Musculoskeletal: no cyanosis of digits and no clubbing  PSYCH: alert & oriented x 3, fluent speech NEURO: no focal motor/sensory deficits  LABORATORY DATA:  I have reviewed the data as listed    Latest Ref Rng & Units 07/18/2024    2:05 PM 12/11/2023    1:08 PM 07/19/2023    2:31 PM  CBC  WBC 4.0 - 10.5 K/uL 5.1  5.0  4.9   Hemoglobin 12.0 - 15.0 g/dL 87.7  86.9  86.9   Hematocrit 36.0 - 46.0 % 37.8  40.6  39.5   Platelets 150 - 400 K/uL 286  296.0  320        Latest Ref Rng & Units 07/19/2023    2:31 PM 01/04/2023    3:01 PM 11/29/2022   10:38 AM  CMP  Glucose 70 - 99 mg/dL 93  95  96   BUN 6 - 20 mg/dL 7  11  10    Creatinine 0.44 - 1.00 mg/dL  0.94  1.01  1.10   Sodium 135 - 145 mmol/L 135  137  141   Potassium 3.5 - 5.1 mmol/L 4.2  4.2  4.8   Chloride 98 - 111 mmol/L 105  107  104   CO2 22 - 32 mmol/L 24  25  20    Calcium 8.9 - 10.3 mg/dL 9.4  9.1  9.3   Total Protein 6.5 - 8.1 g/dL 7.1  7.6  6.8   Total Bilirubin <1.2 mg/dL 0.8  0.8  0.5   Alkaline Phos 38 - 126 U/L 47  46  57   AST 15 - 41 U/L 46  33  25   ALT 0 - 44 U/L 59  54  42     Lab Results  Component Value Date   MPROTEIN 0.7 (H) 07/19/2023   MPROTEIN 0.6 (H) 01/04/2023   MPROTEIN 0.5 (H) 06/27/2022    Lab Results  Component Value Date   KPAFRELGTCHN 9.7 07/19/2023   KPAFRELGTCHN 10.6 01/04/2023   KPAFRELGTCHN 13.4 06/27/2022   LAMBDASER 9.6 07/19/2023   LAMBDASER 7.9 01/04/2023   LAMBDASER 10.1 06/27/2022   KAPLAMBRATIO 1.01 07/19/2023   KAPLAMBRATIO 1.34 01/04/2023   KAPLAMBRATIO 1.33 06/27/2022    RADIOGRAPHIC STUDIES: No results found.  ASSESSMENT & PLAN [name] 48 y.o. female with medical history significant for IgG lambda MGUS who presents for a follow up visit.  After review of the labs, review of the records, and discussion with the patient the patients findings are most consistent with a monoclonal gammopathy.    Monoclonal Gammopathies are a group of medical conditions defined by the presence of a monoclonal protein (an M protein) in the blood or urine. Monoclonal gammopathies include monoclonal gammopathy of unknown significance (MGUS), Monoclonal gammopathies of renal or neurological significance,  smoldering multiple myeloma (SMM), multiple myeloma (MM), AL amyloidosis, and Waldenstrom macroglobulinemia. The goal of the initial workup is to determine which monoclonal gammopathy a patient has. The workup consists of evaluating protein in the serum (with serum protein electrophoresis (SPEP) and serum free light chains) , evaluating protein in the urine (UPEP), and evaluation of the skeleton (DG Bone Met Survey) to assure no lytic lesions. Baseline bloodwork includes CMP and CBC. If no CRAB criteria or high risk criteria are noted then the diagnosis is MGUS. MGUS must be followed with bloodwork periodically to assure it does not convert to multiple myeloma (occurs to approximately 1% of patients per year). If there are CRAB criteria or high risk features (such as elevated serum free light chain ratio (taking into account renal function), a non IgG M protein, or M protein >1.5) then a bone marrow biopsy must be pursued.     #IgG Lambda Monoclonal Gammopathy of  Undetermined Significance --at each visit will order SPEP, SFLC and CBC, CMP, and LDH --metastatic bone survey showed no evidence of lytic lesions --UPEP has not yet been performed, have requested that she pick up the jug today. --Labs last week showed white blood cell count 5.1, Hgb 12.2, MCV 83.4, Plt 286.  Creatinine LFTs within normal limits.  M protein was 0.7 with kappa 9.7, lambda 9.6, and ratio of 1.01 at last check.  --No clear indication for bone marrow biopsy at this time -- Return to clinic in 6 months time with labs 1 week before  No orders of the defined types were placed in this encounter.   All questions were answered. The patient knows to call the clinic with any problems, questions  or concerns.  A total of more than 30 minutes were spent on this encounter with face-to-face time and non-face-to-face time, including preparing to see the patient, ordering tests and/or medications, counseling the patient and coordination of care as outlined above.   Karen IVAR Kidney, MD Department of Hematology/Oncology [facility] Phone: [phone] Pager: [phone] Email: [email or web address]  07/18/2024 2:50 PM

## 2024-07-19 LAB — KAPPA/LAMBDA LIGHT CHAINS
Kappa free light chain: 11.7 mg/L (ref 3.3–19.4)
Kappa, lambda light chain ratio: 1.21 (ref 0.26–1.65)
Lambda free light chains: 9.7 mg/L (ref 5.7–26.3)

## 2024-07-22 ENCOUNTER — Other Ambulatory Visit (HOSPITAL_BASED_OUTPATIENT_CLINIC_OR_DEPARTMENT_OTHER): Payer: Self-pay

## 2024-07-22 LAB — MULTIPLE MYELOMA PANEL, SERUM
Albumin SerPl Elph-Mcnc: 3.9 g/dL (ref 2.9–4.4)
Albumin/Glob SerPl: 1.4 (ref 0.7–1.7)
Alpha 1: 0.1 g/dL (ref 0.0–0.4)
Alpha2 Glob SerPl Elph-Mcnc: 0.6 g/dL (ref 0.4–1.0)
B-Globulin SerPl Elph-Mcnc: 1 g/dL (ref 0.7–1.3)
Gamma Glob SerPl Elph-Mcnc: 1.1 g/dL (ref 0.4–1.8)
Globulin, Total: 2.8 g/dL (ref 2.2–3.9)
IgA: 90 mg/dL (ref 87–352)
IgG (Immunoglobin G), Serum: 1278 mg/dL (ref 586–1602)
IgM (Immunoglobulin M), Srm: 48 mg/dL (ref 26–217)
M Protein SerPl Elph-Mcnc: 0.6 g/dL — ABNORMAL HIGH
Total Protein ELP: 6.7 g/dL (ref 6.0–8.5)

## 2024-07-23 ENCOUNTER — Telehealth: Payer: Self-pay

## 2024-07-23 ENCOUNTER — Ambulatory Visit (HOSPITAL_COMMUNITY)
Admission: RE | Admit: 2024-07-23 | Discharge: 2024-07-23 | Disposition: A | Discharge status: Home / Self Care | Source: Ambulatory Visit | Attending: Hematology and Oncology | Admitting: Hematology and Oncology

## 2024-07-23 DIAGNOSIS — D472 Monoclonal gammopathy: Secondary | ICD-10-CM | POA: Insufficient documentation

## 2024-07-23 NOTE — Telephone Encounter (Signed)
 Telephone call to patient to relay the following message from Dr Federico: MGUS labs are stable, her M protein is 0.6. We will plan to see her back as scheduled in June 2026 Patient did not answer, left voicemail requesting patient to call us  back.

## 2024-08-14 ENCOUNTER — Encounter (HOSPITAL_BASED_OUTPATIENT_CLINIC_OR_DEPARTMENT_OTHER): Payer: Self-pay | Admitting: Cardiovascular Disease

## 2024-08-14 ENCOUNTER — Encounter: Payer: Self-pay | Admitting: Family

## 2024-08-14 DIAGNOSIS — E66813 Obesity, class 3: Secondary | ICD-10-CM

## 2024-08-17 ENCOUNTER — Other Ambulatory Visit: Payer: Self-pay | Admitting: Family

## 2024-08-17 DIAGNOSIS — M62838 Other muscle spasm: Secondary | ICD-10-CM

## 2024-08-20 ENCOUNTER — Ambulatory Visit: Payer: Self-pay

## 2024-08-20 NOTE — Telephone Encounter (Signed)
 Please see the MyChart message reply(ies) for my assessment and plan.  The patient gave consent for this Medical Advice Message and is aware that it may result in a bill to their insurance company as well as the possibility that this may result in a co-payment or deductible. They are an established patient, but are not seeking medical advice exclusively about a problem treated during an in person or video visit in the last 7 days. I did not recommend an in person or video visit within 7 days of my reply.  I spent a total of 3 minutes cumulative time within 7 days through [employer] Ginger Patrick, FNP

## 2024-08-20 NOTE — Telephone Encounter (Signed)
 Spoke with pt and forwarded the following message from Dr Federico   Please let Mrs. [name] know that her bone scan showed no concerning abnormalities. Additionally her bloodwork was most consistent with MGUS and no clear progression to MM. We will plan to see her  back in June 2026 to reassess.  Pt voiced understanding.

## 2024-08-20 NOTE — Telephone Encounter (Signed)
-----   Message from Nurse Almarie DASEN, RN sent at 08/19/2024  5:31 PM EST ----- Regarding: FW: result  ----- Message ----- From: Gina Nena HERO, RN Sent: 08/14/2024   4:45 PM EST To: Almarie DELENA Arabia, RN Subject: RE: result                                      ----- Message ----- From: Arabia Almarie DELENA, RN Sent: 08/12/2024   5:00 PM EST To: Nena HERO Gina, RN Subject: result                                          ----- Message ----- From: Federico Norleen DASEN MADISON, MD Sent: 08/11/2024   8:50 AM EST To: Almarie DELENA Arabia, RN  Please let Mrs. Clausing know that her bone scan showed no concerning abnormalities. Additionally her bloodwork was most consistent with MGUS and no clear progression to MM. We will plan to see her  back in June 2026 to reassess.

## 2024-08-28 ENCOUNTER — Other Ambulatory Visit: Payer: Self-pay | Admitting: Family

## 2024-08-29 ENCOUNTER — Other Ambulatory Visit: Payer: Self-pay | Admitting: Cardiovascular Disease

## 2024-08-29 DIAGNOSIS — E66813 Obesity, class 3: Secondary | ICD-10-CM

## 2024-08-30 ENCOUNTER — Other Ambulatory Visit (HOSPITAL_BASED_OUTPATIENT_CLINIC_OR_DEPARTMENT_OTHER): Payer: Self-pay

## 2024-08-30 MED ORDER — WEGOVY 2.4 MG/0.75ML ~~LOC~~ SOAJ
2.4000 mg | SUBCUTANEOUS | 5 refills | Status: AC
  Filled 2024-08-30: qty 3, 28d supply, fill #0

## 2025-01-09 ENCOUNTER — Inpatient Hospital Stay

## 2025-01-16 ENCOUNTER — Inpatient Hospital Stay: Admitting: Hematology and Oncology
